# Patient Record
Sex: Male | Born: 1950
Health system: Southern US, Community
[De-identification: ages and names within clinical notes are randomized; demographics above are authoritative.]

## PROBLEM LIST (undated history)

## (undated) DIAGNOSIS — K219 Gastro-esophageal reflux disease without esophagitis: Secondary | ICD-10-CM

## (undated) DIAGNOSIS — E785 Hyperlipidemia, unspecified: Secondary | ICD-10-CM

## (undated) DIAGNOSIS — G4700 Insomnia, unspecified: Secondary | ICD-10-CM

## (undated) DIAGNOSIS — I700 Atherosclerosis of aorta: Secondary | ICD-10-CM

## (undated) DIAGNOSIS — I77811 Abdominal aortic ectasia: Secondary | ICD-10-CM

## (undated) DIAGNOSIS — M109 Gout, unspecified: Secondary | ICD-10-CM

## (undated) DIAGNOSIS — H409 Unspecified glaucoma: Secondary | ICD-10-CM

## (undated) DIAGNOSIS — I10 Essential (primary) hypertension: Secondary | ICD-10-CM

## (undated) HISTORY — DX: Unspecified glaucoma: H40.9

## (undated) HISTORY — DX: Gastro-esophageal reflux disease without esophagitis: K21.9

## (undated) HISTORY — DX: Atherosclerosis of aorta: I70.0

## (undated) HISTORY — DX: Hyperlipidemia, unspecified: E78.5

## (undated) HISTORY — DX: Insomnia, unspecified: G47.00

## (undated) HISTORY — PX: CARPAL TUNNEL RELEASE: SHX101

## (undated) HISTORY — DX: Abdominal aortic ectasia: I77.811

## (undated) HISTORY — DX: Essential (primary) hypertension: I10

## (undated) HISTORY — DX: Gout, unspecified: M10.9

## (undated) HISTORY — PX: TRIGGER FINGER RELEASE: SHX641

## (undated) NOTE — *Deleted (*Deleted)
   [facility]  Name: [name]  MRN: [id number] DOB: 02/07/1951  Chief Complaint/ HPI  Victor Knight,  52 y.o. , male presents for their Follow-Up CCM visit with the clinical pharmacist via telephone due to COVID-19 Pandemic.  PCP : Danelle Berry, PA-C  Their chronic conditions include: ***  Office Visits: 9/22 HLD, [place], BP 124/78 P 65 Wt 195 BMI 28.8  Consult Visit:***  Medications: Outpatient Encounter Medications as of 01/28/2020  Medication Sig  . allopurinol (ZYLOPRIM) 100 MG tablet Take 1 tablet (100 mg total) by mouth daily.  Marland Kitchen lisinopril (ZESTRIL) 10 MG tablet Take 1 tablet (10 mg total) by mouth in the morning.  Marland Kitchen LUMIGAN 0.01 % SOLN Place 1 drop into the right eye at bedtime.   . Multiple Vitamin (MULTIVITAMIN) tablet Take 1 tablet by mouth daily.  Melene Muller ON 02/16/2020] rosuvastatin (CRESTOR) 40 MG tablet Take 1 tablet (40 mg total) by mouth daily.  . traZODone (DESYREL) 100 MG tablet TAKE 1/2 TO 1 TABLET BY MOUTH AT BEDTIME AS NEEDED FOR SLEEP   No facility-administered encounter medications on file as of 01/28/2020.     Current Diagnosis/Assessment:  Goals Addressed   None    Hypertension   BP goal is:  {CHL HP UPSTREAM Pharmacist BP ranges:2522373018}  Office blood pressures are  BP Readings from Last 3 Encounters:  01/07/20 124/78  10/08/19 (!) 151/80  08/22/19 130/80   Patient checks BP at home {CHL HP BP Monitoring Frequency:5515328716} Patient home BP readings are ranging: ***  Patient has failed these meds in the past: *** Patient is currently {CHL Controlled/Uncontrolled:[phone]} on the following medications:  . ***  We discussed: Taper inc lisinopril Taper d/c atenolol  Plan  Continue {CHL HP Upstream Pharmacy [phone]}      Hyperlipidemia   LDL goal < ***  Lipid Panel     Component Value Date/Time   CHOL 138 08/27/2019 0936   TRIG 62 08/27/2019 0936   HDL 62 08/27/2019 0936    LDLCALC 62 08/27/2019 0936    Hepatic Function Latest Ref Rng & Units 08/27/2019 07/02/2019 05/09/2019  Total Protein 6.1 - 8.1 g/dL 6.4 6.5 6.6  Albumin 3.5 - 5.0 g/dL - - -  AST 10 - 35 U/L 28 28 56(H)  ALT 9 - 46 U/L 28 29 78(H)  Alk Phosphatase 38 - 126 U/L - - -  Total Bilirubin 0.2 - 1.2 mg/dL 0.6 0.7 0.4     The 40-JWJX ASCVD risk score Denman George DC Jr., et al., 2013) is: 13.8%   Values used to calculate the score:     Age: 20 years     Sex: Male     Is Non-Hispanic African American: No     Diabetic: No     Tobacco smoker: No     Systolic Blood Pressure: 124 mmHg     Is BP treated: Yes     HDL Cholesterol: 62 mg/dL     Total Cholesterol: 138 mg/dL   Patient has failed these meds in past: *** Patient is currently {CHL Controlled/Uncontrolled:[phone]} on the following medications:  . ***  We discussed:   Holding Zetia?  Plan  Continue {CHL HP Upstream Pharmacy [phone]}  {CHL HP Upstream Pharmacy Diagnosis/[phone]}

---

## 2006-05-01 ENCOUNTER — Ambulatory Visit: Payer: Self-pay | Admitting: Gastroenterology

## 2006-09-24 ENCOUNTER — Ambulatory Visit: Payer: Self-pay | Admitting: Family Medicine

## 2009-09-28 ENCOUNTER — Ambulatory Visit: Payer: Self-pay | Admitting: Family Medicine

## 2009-10-05 ENCOUNTER — Emergency Department: Payer: Self-pay | Admitting: Emergency Medicine

## 2010-03-21 ENCOUNTER — Ambulatory Visit: Payer: Self-pay | Admitting: Specialist

## 2010-03-31 ENCOUNTER — Ambulatory Visit: Payer: Self-pay | Admitting: Specialist

## 2014-02-24 ENCOUNTER — Ambulatory Visit: Payer: Self-pay | Admitting: Anesthesiology

## 2014-03-03 ENCOUNTER — Ambulatory Visit: Payer: Self-pay | Admitting: Specialist

## 2014-04-14 ENCOUNTER — Ambulatory Visit: Payer: Self-pay | Admitting: Specialist

## 2014-07-13 DIAGNOSIS — D239 Other benign neoplasm of skin, unspecified: Secondary | ICD-10-CM

## 2014-07-13 HISTORY — DX: Other benign neoplasm of skin, unspecified: D23.9

## 2014-08-08 NOTE — Op Note (Signed)
PATIENT NAME:  Victor Knight, Victor Knight MR#:  [id number] DATE OF BIRTH:  1950/11/27  DATE OF PROCEDURE:  03/03/2014  PREOPERATIVE DIAGNOSIS: Dupuytren's fascia contracture, right palm and right little finger.   POSTOPERATIVE DIAGNOSIS:  Dupuytren's fascia contracture, right palm and right little finger.   PROCEDURE: Excision of Dupuytren's fascia and contracture, right palm and little finger.   SURGEON: Christophe Louis, M.D.   ANESTHESIA: General.   COMPLICATIONS: None.   TOURNIQUET TIME: 85 minutes.   DRAINS: One vessel loop drain.   DESCRIPTION OF PROCEDURE: After adequate induction of general anesthesia, the right upper extremity is thoroughly prepped with alcohol and ChloraPrep and draped in standard sterile fashion. The extremity is wrapped out with the Esmarch bandage and pneumatic tourniquet is elevated to 250 mmHg.  Under loupe magnification, standard volar Bruner incision is made beginning proximally out to the middle phalanx of the little finger. The skin flaps are developed proximally and the neurovascular bundles are identified. The neurovascular bundles were then completely identified sequentially all the way out to beyond the Dupuytren's fascia overlying the proximal phalanx. After this had been performed, the fascia could be completely excised and there was complete relief of the MP joint flexion contracture.   The wound is thoroughly irrigated multiple times. Ulnar nerve block is performed at the wrist using plain 0.5% Marcaine. The wound is then closed with multiple 5-0 nylon sutures over a vessel loop drain. Soft bulky dressing is applied. The tourniquet is released and capillary refill returns to the finger immediately. The patient is returned to the recovery room in satisfactory condition having tolerated the procedure quite well.    ____________________________ Lucas Mallow, MD ces:kl D: 03/03/2014 13:13:48 ET T: 03/03/2014 14:17:15  ET JOB#: [id number]  cc: Lucas Mallow, MD, <Dictator> Lucas Mallow MD ELECTRONICALLY SIGNED 03/04/2014 15:56

## 2014-08-12 NOTE — Op Note (Signed)
PATIENT NAME:  Victor Knight, Victor Knight MR#:  [id number] DATE OF BIRTH:  11/30/1950  DATE OF PROCEDURE:  04/14/2014  PREOPERATIVE DIAGNOSIS:  Dupuytren's contracture, left palm and left little finger.   POSTOPERATIVE DIAGNOSIS: Dupuytren's contracture, left palm and left little finger.    PROCEDURE: Excision of Dupuytren's fascia, left palm and left little finger.   SURGEON:  Christophe Louis, M.D.   ANESTHESIA: General.   COMPLICATIONS: None.   TOURNIQUET TIME:  Seventy-three minutes.   DESCRIPTION OF PROCEDURE: After adequate induction of general anesthesia, the left upper extremity and hand were thoroughly prepped with alcohol and ChloraPrep and draped in standard sterile fashion.  The extremity is wrapped out with the Esmarch bandage and pneumatic tourniquet elevated to 250 mmHg. Under loupe magnification, standard volar Bruner incision is made and starting proximally, the most proximal portion of the Dupuytren's cord to the little finger is excised. The neurovascular bundles were then identified on each side and dissected out completely to the middle phalanx level of the little finger. After this had been done the contracted fascia was then peeled off of the skin and completely excised. Careful palpation demonstrated no residual diseased fascia. The neurovascular bundles were protected at all times. The wound is thoroughly irrigated multiple times. Skin edges are closed with 4-0 nylon, ulnar nerve block is performed at the wrist with plain 1% Xylocaine. A soft bulky dressing is applied and the tourniquet is released. The patient is returned to the recovery room in satisfactory condition having tolerated the procedure quite well.     ____________________________ Lucas Mallow, MD ces:at D: 04/14/2014 10:41:22 ET T: 04/14/2014 10:48:29 ET JOB#: [id number]  cc: Lucas Mallow, MD, <Dictator> Lucas Mallow MD ELECTRONICALLY SIGNED 04/21/2014 12:07

## 2014-09-29 ENCOUNTER — Other Ambulatory Visit: Payer: Self-pay | Admitting: Family Medicine

## 2014-10-05 ENCOUNTER — Encounter: Payer: Self-pay | Admitting: Family Medicine

## 2014-10-05 ENCOUNTER — Ambulatory Visit (INDEPENDENT_AMBULATORY_CARE_PROVIDER_SITE_OTHER): Payer: BC Managed Care – PPO | Admitting: Family Medicine

## 2014-10-05 VITALS — BP 122/84 | HR 56 | Temp 98.4°F | Resp 16 | Ht 69.0 in | Wt 175.4 lb

## 2014-10-05 DIAGNOSIS — G4700 Insomnia, unspecified: Secondary | ICD-10-CM | POA: Diagnosis not present

## 2014-10-05 DIAGNOSIS — I10 Essential (primary) hypertension: Secondary | ICD-10-CM | POA: Insufficient documentation

## 2014-10-05 DIAGNOSIS — M1000 Idiopathic gout, unspecified site: Secondary | ICD-10-CM | POA: Diagnosis not present

## 2014-10-05 DIAGNOSIS — M109 Gout, unspecified: Secondary | ICD-10-CM | POA: Insufficient documentation

## 2014-10-05 DIAGNOSIS — E785 Hyperlipidemia, unspecified: Secondary | ICD-10-CM | POA: Diagnosis not present

## 2014-10-05 MED ORDER — ZOLPIDEM TARTRATE 10 MG PO TABS: 10.0000 mg | ORAL_TABLET | Freq: Every day | ORAL | Status: DC

## 2014-10-05 NOTE — Patient Instructions (Signed)
F/U 6 MO 

## 2014-10-05 NOTE — Progress Notes (Signed)
Name: Victor Knight   MRN: [id number]    DOB: 1950-08-27   Date:10/05/2014       Progress Note  Subjective  Chief Complaint  Chief Complaint  Patient presents with  . Hypertension  . Insomnia  . Hyperlipidemia    Hypertension This is a chronic problem. The current episode started more than 1 year ago. The problem is unchanged. The problem is controlled. Pertinent negatives include no blurred vision, chest pain, headaches, neck pain, orthopnea, palpitations or shortness of breath. There are no associated agents to hypertension. There are no known risk factors for coronary artery disease. Past treatments include beta blockers. The current treatment provides moderate improvement. There are no compliance problems.   Hyperlipidemia This is a chronic problem. The current episode started more than 1 year ago. The problem is controlled. Recent lipid tests were reviewed and are normal. Factors aggravating his hyperlipidemia include beta blockers. Pertinent negatives include no chest pain, focal weakness, myalgias or shortness of breath. Current antihyperlipidemic treatment includes statins. The current treatment provides moderate improvement of lipids. There are no compliance problems.  Risk factors for coronary artery disease include dyslipidemia and male sex.   Gout  Patient has had a recent flare of gout. He remains on his regimen of allopurinol. No renal complications  Insomnia  Patient continues with problem with insomnia. Ambien continues to work effectively for him.    Past Medical History  Diagnosis Date  . Hyperlipidemia   . Hypertension   . Insomnia   . Gout     History  Substance Use Topics  . Smoking status: Never Smoker   . Smokeless tobacco: Not on file  . Alcohol Use: No     Current outpatient prescriptions:  .  allopurinol (ZYLOPRIM) 100 MG tablet, Take 100 mg by mouth daily., Disp: , Rfl:  .  atenolol (TENORMIN) 100 MG tablet, take 1 tablet by mouth once  daily, Disp: 90 tablet, Rfl: 3 .  atorvastatin (LIPITOR) 40 MG tablet, take 1 tablet by mouth once daily, Disp: 90 tablet, Rfl: 3 .  LUMIGAN 0.01 % SOLN, instill 1 drop into right eye once daily at bedtime, Disp: , Rfl: 0 .  zolpidem (AMBIEN) 10 MG tablet, Take 1 tablet (10 mg total) by mouth at bedtime., Disp: 30 tablet, Rfl: 5  No Known Allergies  Review of Systems  Constitutional: Negative for fever, chills and weight loss.  HENT: Negative for congestion, hearing loss, sore throat and tinnitus.   Eyes: Negative for blurred vision, double vision and redness.  Respiratory: Negative for cough, hemoptysis and shortness of breath.   Cardiovascular: Negative for chest pain, palpitations, orthopnea, claudication and leg swelling.  Gastrointestinal: Negative for heartburn, nausea, vomiting, diarrhea, constipation and blood in stool.  Genitourinary: Negative for dysuria, urgency, frequency and hematuria.  Musculoskeletal: Positive for joint pain. Negative for myalgias, back pain, falls and neck pain.  Skin: Negative for itching.  Neurological: Negative for dizziness, tingling, tremors, focal weakness, seizures, loss of consciousness, weakness and headaches.  Endo/Heme/Allergies: Does not bruise/bleed easily.  Psychiatric/Behavioral: Negative for depression and substance abuse. The patient has insomnia. The patient is not nervous/anxious.      Objective  Filed Vitals:   10/05/14 0909  BP: 122/84  Pulse: 56  Temp: 98.4 F (36.9 C)  TempSrc: Oral  Resp: 16  Height: 5\' 9"  (1.753 m)  Weight: 175 lb 6.4 oz (79.561 kg)  SpO2: 98%     Physical Exam  Constitutional: He is oriented to person,  place, and time and well-developed, well-nourished, and in no distress.  HENT:  Head: Normocephalic.  Eyes: EOM are normal. Pupils are equal, round, and reactive to light.  Neck: Normal range of motion. Neck supple. No thyromegaly present.  Cardiovascular: Normal rate, regular rhythm and normal  heart sounds.   No murmur heard. Pulmonary/Chest: Effort normal and breath sounds normal. No respiratory distress. He has no wheezes.  Abdominal: Soft. Bowel sounds are normal.  Musculoskeletal: Normal range of motion. He exhibits no edema.  Lymphadenopathy:    He has no cervical adenopathy.  Neurological: He is alert and oriented to person, place, and time. No cranial nerve deficit. Gait normal. Coordination normal.  Skin: Skin is warm and dry. No rash noted.  Psychiatric: Affect and judgment normal.  Insomnia      Assessment & Plan  1. Essential hypertension Well-controlled  2. Insomnia Well-controlled on Ambien - zolpidem (AMBIEN) 10 MG tablet; Take 1 tablet (10 mg total) by mouth at bedtime.  Dispense: 30 tablet; Refill: 5  3. Hyperlipemia Last levels were good  4. Idiopathic gout, unspecified chronicity, unspecified site No recent flareup

## 2014-11-09 ENCOUNTER — Other Ambulatory Visit: Payer: Self-pay | Admitting: Family Medicine

## 2015-03-08 ENCOUNTER — Other Ambulatory Visit: Payer: Self-pay | Admitting: Orthopedic Surgery

## 2015-03-08 DIAGNOSIS — M25511 Pain in right shoulder: Secondary | ICD-10-CM

## 2015-03-26 ENCOUNTER — Other Ambulatory Visit: Payer: Self-pay | Admitting: Family Medicine

## 2015-03-26 ENCOUNTER — Encounter: Payer: Self-pay | Admitting: Family Medicine

## 2015-03-26 ENCOUNTER — Ambulatory Visit (INDEPENDENT_AMBULATORY_CARE_PROVIDER_SITE_OTHER): Payer: BC Managed Care – PPO | Admitting: Family Medicine

## 2015-03-26 VITALS — BP 124/82 | HR 63 | Temp 98.7°F | Resp 18 | Ht 69.0 in | Wt 173.8 lb

## 2015-03-26 DIAGNOSIS — M1000 Idiopathic gout, unspecified site: Secondary | ICD-10-CM

## 2015-03-26 DIAGNOSIS — E785 Hyperlipidemia, unspecified: Secondary | ICD-10-CM | POA: Diagnosis not present

## 2015-03-26 DIAGNOSIS — G4700 Insomnia, unspecified: Secondary | ICD-10-CM

## 2015-03-26 DIAGNOSIS — I10 Essential (primary) hypertension: Secondary | ICD-10-CM

## 2015-03-26 DIAGNOSIS — L03115 Cellulitis of right lower limb: Secondary | ICD-10-CM

## 2015-03-26 MED ORDER — ZOLPIDEM TARTRATE 10 MG PO TABS: 10.0000 mg | ORAL_TABLET | Freq: Every day | ORAL | Status: DC

## 2015-03-26 NOTE — Progress Notes (Signed)
Name: Victor Knight   MRN: [id number]    DOB: 1951-01-06   Date:03/26/2015       Progress Note  Subjective  Chief Complaint  Chief Complaint  Patient presents with  . Recurrent Skin Infections    on right knee for 1 week  . Hypertension    6 month follow up  . Hyperlipidemia  . Insomnia  . Gout    HPI  Hypertension   Patient presents for follow-up of hypertension. It has been present for over 5 years.  Patient states that there is compliance with medical regimen which consists of atenolol 100 mg daily . There is no end organ disease. Cardiac risk factors include hypertension hyperlipidemia and diabetes.  Exercise regimen consist of walking .  Diet consist of salt restriction .  Hyperlipidemia  Patient has a history of hyperlipidemia for over 5 years.  Current medical regimen consist of atorvastatin 40 mg daily at bedtime .  Compliance is good .  Diet and exercise are currently followed regularly .  Risk factors for cardiovascular disease include hyperlipidemia hypertension .   There have been no side effects from the medication.    Gout  Gout is not been stable. No recent attacks of gouty arthritis started his he remains on allopurinol 100 mg daily and has been given colchicine and steroids were NSAIDs for flares.  Insomnia  History of insomnia for greater than 5 years. He does well on Ambien 10 mg by mouth daily at bedtime.  Cellulitis of the right knee  Patient noted a Papule on his right knee about 2 weeks ago. It is continued Her to drain intermittently is painful. There is no fever or chills. No history of exposure. There is no history of exposure to sponsor more whirlpools or locker room exposure where others were ill    Past Medical History  Diagnosis Date  . Hyperlipidemia   . Hypertension   . Insomnia   . Gout     Social History  Substance Use Topics  . Smoking status: Never Smoker   . Smokeless tobacco: Not on file  . Alcohol Use: No     Current  outpatient prescriptions:  .  allopurinol (ZYLOPRIM) 100 MG tablet, take 2 tablets by mouth once daily, Disp: 60 tablet, Rfl: 11 .  atenolol (TENORMIN) 100 MG tablet, take 1 tablet by mouth once daily, Disp: 90 tablet, Rfl: 3 .  atorvastatin (LIPITOR) 40 MG tablet, take 1 tablet by mouth once daily, Disp: 90 tablet, Rfl: 3 .  LUMIGAN 0.01 % SOLN, instill 1 drop into right eye once daily at bedtime, Disp: , Rfl: 0 .  naproxen (NAPROSYN) 500 MG tablet, Take 500 mg by mouth 2 (two) times daily with a meal., Disp: , Rfl: 0 .  traMADol (ULTRAM) 50 MG tablet, take 1 tablet by mouth every 6 hours if needed for pain, Disp: , Rfl: 0 .  zolpidem (AMBIEN) 10 MG tablet, Take 1 tablet (10 mg total) by mouth at bedtime., Disp: 30 tablet, Rfl: 5  No Known Allergies  Review of Systems  Constitutional: Negative for fever, chills and weight loss.  HENT: Negative for congestion, hearing loss, sore throat and tinnitus.   Eyes: Negative for blurred vision, double vision and redness.  Respiratory: Negative for cough, hemoptysis and shortness of breath.   Cardiovascular: Negative for chest pain, palpitations, orthopnea, claudication and leg swelling.  Gastrointestinal: Negative for heartburn, nausea, vomiting, diarrhea, constipation and blood in stool.  Genitourinary: Negative for dysuria,  urgency, frequency and hematuria.  Musculoskeletal: Positive for joint pain. Negative for myalgias, back pain, falls and neck pain.  Skin: Positive for rash. Negative for itching.  Neurological: Negative for dizziness, tingling, tremors, focal weakness, seizures, loss of consciousness, weakness and headaches.  Endo/Heme/Allergies: Does not bruise/bleed easily.  Psychiatric/Behavioral: Negative for depression and substance abuse. The patient has insomnia. The patient is not nervous/anxious.      Objective  Filed Vitals:   03/26/15 0813  BP: 124/82  Pulse: 63  Temp: 98.7 F (37.1 C)  TempSrc: Oral  Resp: 18  Height: 5'  9" (1.753 m)  Weight: 173 lb 12.8 oz (78.835 kg)  SpO2: 97%     Physical Exam  Constitutional: He is oriented to person, place, and time and well-developed, well-nourished, and in no distress.  HENT:  Head: Normocephalic.  Eyes: EOM are normal. Pupils are equal, round, and reactive to light.  Neck: Normal range of motion. Neck supple. No thyromegaly present.  Cardiovascular: Normal rate, regular rhythm and normal heart sounds.   No murmur heard. Pulmonary/Chest: Effort normal and breath sounds normal. No respiratory distress. He has no wheezes.  Musculoskeletal: Normal range of motion. He exhibits no edema.  Lymphadenopathy:    He has no cervical adenopathy.  Neurological: He is alert and oriented to person, place, and time. No cranial nerve deficit. Gait normal. Coordination normal.  Skin: No rash noted.  There is a 1 cm draining papule pustular drainage and 826-3 cm area surrounding erythema warmth and mild tenderness over the right knee. No palpable cord is noted. There is no linear streak from the area that would be consistent with lymphangitis  Psychiatric: Affect and judgment normal.      Assessment & Plan   1. Cellulitis of right lower extremity Probably secondary to MRSA - Wound culture  2. Insomnia Continue Ambien - zolpidem (AMBIEN) 10 MG tablet; Take 1 tablet (10 mg total) by mouth at bedtime.  Dispense: 30 tablet; Refill: 5  3. Hyperlipemia Labs on return visit  4. Idiopathic gout, unspecified chronicity, unspecified site Uric acid on return visit Continue allopurinol  5.hypertension discharge temperature culture

## 2015-03-29 ENCOUNTER — Ambulatory Visit
Admission: RE | Admit: 2015-03-29 | Discharge: 2015-03-29 | Disposition: A | Discharge status: Home / Self Care | Payer: BC Managed Care – PPO | Source: Ambulatory Visit | Attending: Orthopedic Surgery | Admitting: Orthopedic Surgery

## 2015-03-29 DIAGNOSIS — M25411 Effusion, right shoulder: Secondary | ICD-10-CM | POA: Diagnosis not present

## 2015-03-29 DIAGNOSIS — M75111 Incomplete rotator cuff tear or rupture of right shoulder, not specified as traumatic: Secondary | ICD-10-CM | POA: Diagnosis not present

## 2015-03-29 DIAGNOSIS — M25511 Pain in right shoulder: Secondary | ICD-10-CM | POA: Insufficient documentation

## 2015-03-29 DIAGNOSIS — M7591 Shoulder lesion, unspecified, right shoulder: Secondary | ICD-10-CM | POA: Insufficient documentation

## 2015-03-29 DIAGNOSIS — M19011 Primary osteoarthritis, right shoulder: Secondary | ICD-10-CM | POA: Insufficient documentation

## 2015-03-30 LAB — WOUND CULTURE

## 2015-03-31 ENCOUNTER — Telehealth: Payer: Self-pay | Admitting: Emergency Medicine

## 2015-03-31 NOTE — Telephone Encounter (Signed)
Patient notified of culture

## 2015-04-08 ENCOUNTER — Ambulatory Visit: Payer: BC Managed Care – PPO | Admitting: Family Medicine

## 2015-04-29 ENCOUNTER — Other Ambulatory Visit: Payer: Self-pay | Admitting: Family Medicine

## 2015-06-08 ENCOUNTER — Ambulatory Visit: Payer: BC Managed Care – PPO | Admitting: Family Medicine

## 2015-07-07 ENCOUNTER — Ambulatory Visit: Payer: BC Managed Care – PPO | Admitting: Family Medicine

## 2015-09-07 DIAGNOSIS — H40051 Ocular hypertension, right eye: Secondary | ICD-10-CM | POA: Diagnosis not present

## 2015-09-22 ENCOUNTER — Other Ambulatory Visit: Payer: Self-pay | Admitting: Family Medicine

## 2015-09-24 ENCOUNTER — Encounter: Payer: BC Managed Care – PPO | Admitting: Family Medicine

## 2015-09-29 DIAGNOSIS — Z1389 Encounter for screening for other disorder: Secondary | ICD-10-CM | POA: Diagnosis not present

## 2015-09-29 DIAGNOSIS — S61248A Puncture wound with foreign body of other finger without damage to nail, initial encounter: Secondary | ICD-10-CM | POA: Diagnosis not present

## 2015-09-29 DIAGNOSIS — K769 Liver disease, unspecified: Secondary | ICD-10-CM | POA: Insufficient documentation

## 2015-09-29 DIAGNOSIS — Z283 Underimmunization status: Secondary | ICD-10-CM | POA: Diagnosis not present

## 2015-10-05 ENCOUNTER — Encounter: Payer: Self-pay | Admitting: Family Medicine

## 2015-10-05 ENCOUNTER — Ambulatory Visit (INDEPENDENT_AMBULATORY_CARE_PROVIDER_SITE_OTHER): Payer: PPO | Admitting: Family Medicine

## 2015-10-05 VITALS — HR 56 | Temp 98.6°F | Resp 14 | Wt 173.0 lb

## 2015-10-05 DIAGNOSIS — Z23 Encounter for immunization: Secondary | ICD-10-CM | POA: Diagnosis not present

## 2015-10-05 DIAGNOSIS — Z720 Tobacco use: Secondary | ICD-10-CM | POA: Diagnosis not present

## 2015-10-05 DIAGNOSIS — E785 Hyperlipidemia, unspecified: Secondary | ICD-10-CM

## 2015-10-05 DIAGNOSIS — Z5181 Encounter for therapeutic drug level monitoring: Secondary | ICD-10-CM | POA: Diagnosis not present

## 2015-10-05 DIAGNOSIS — Z9189 Other specified personal risk factors, not elsewhere classified: Secondary | ICD-10-CM

## 2015-10-05 DIAGNOSIS — M1000 Idiopathic gout, unspecified site: Secondary | ICD-10-CM

## 2015-10-05 DIAGNOSIS — Z0000 Encounter for general adult medical examination without abnormal findings: Secondary | ICD-10-CM

## 2015-10-05 DIAGNOSIS — I10 Essential (primary) hypertension: Secondary | ICD-10-CM

## 2015-10-05 DIAGNOSIS — Z87891 Personal history of nicotine dependence: Secondary | ICD-10-CM | POA: Insufficient documentation

## 2015-10-05 DIAGNOSIS — M109 Gout, unspecified: Secondary | ICD-10-CM | POA: Diagnosis not present

## 2015-10-05 DIAGNOSIS — Z125 Encounter for screening for malignant neoplasm of prostate: Secondary | ICD-10-CM | POA: Diagnosis not present

## 2015-10-05 DIAGNOSIS — Z789 Other specified health status: Secondary | ICD-10-CM | POA: Diagnosis not present

## 2015-10-05 DIAGNOSIS — Z8619 Personal history of other infectious and parasitic diseases: Secondary | ICD-10-CM

## 2015-10-05 LAB — CBC WITH DIFFERENTIAL/PLATELET
BASOS ABS: 71 {cells}/uL (ref 0–200)
Basophils Relative: 1 %
EOS PCT: 1 %
Eosinophils Absolute: 71 cells/uL (ref 15–500)
HCT: 40.1 % (ref 38.5–50.0)
HEMOGLOBIN: 13.6 g/dL (ref 13.2–17.1)
LYMPHS ABS: 2414 {cells}/uL (ref 850–3900)
Lymphocytes Relative: 34 %
MCH: 30.2 pg (ref 27.0–33.0)
MCHC: 33.9 g/dL (ref 32.0–36.0)
MCV: 88.9 fL (ref 80.0–100.0)
MONOS PCT: 7 %
MPV: 9.9 fL (ref 7.5–12.5)
Monocytes Absolute: 497 cells/uL (ref 200–950)
NEUTROS ABS: 4047 {cells}/uL (ref 1500–7800)
Neutrophils Relative %: 57 %
PLATELETS: 211 10*3/uL (ref 140–400)
RBC: 4.51 MIL/uL (ref 4.20–5.80)
RDW: 13.6 % (ref 11.0–15.0)
WBC: 7.1 10*3/uL (ref 3.8–10.8)

## 2015-10-05 LAB — LIPID PANEL
Cholesterol: 181 mg/dL (ref 125–200)
HDL: 64 mg/dL (ref >=40)
LDL CALC: 94 mg/dL (ref <130)
TRIGLYCERIDES: 115 mg/dL (ref <150)
Total CHOL/HDL Ratio: 2.8 Ratio (ref <=5.0)
VLDL: 23 mg/dL (ref <30)

## 2015-10-05 LAB — COMPREHENSIVE METABOLIC PANEL
ALK PHOS: 48 U/L (ref 40–115)
ALT: 30 U/L (ref 9–46)
AST: 26 U/L (ref 10–35)
Albumin: 4.3 g/dL (ref 3.6–5.1)
BUN: 20 mg/dL (ref 7–25)
CO2: 25 mmol/L (ref 20–31)
CREATININE: 0.99 mg/dL (ref 0.70–1.25)
Calcium: 9.1 mg/dL (ref 8.6–10.3)
Chloride: 104 mmol/L (ref 98–110)
GLUCOSE: 87 mg/dL (ref 65–99)
Potassium: 4.4 mmol/L (ref 3.5–5.3)
SODIUM: 139 mmol/L (ref 135–146)
Total Bilirubin: 0.4 mg/dL (ref 0.2–1.2)
Total Protein: 6.7 g/dL (ref 6.1–8.1)

## 2015-10-05 LAB — URIC ACID: Uric Acid, Serum: 5.7 mg/dL (ref 4.0–8.0)

## 2015-10-05 NOTE — Assessment & Plan Note (Signed)
Check uric acid. 

## 2015-10-05 NOTE — Assessment & Plan Note (Signed)
Controlled, DASH guidelines

## 2015-10-05 NOTE — Assessment & Plan Note (Signed)
Check fasting lipids today 

## 2015-10-05 NOTE — Progress Notes (Signed)
Patient ID: Victor Knight, male   DOB: 1950-11-13, 65 y.o.   MRN: [id number]   Subjective:   Victor Knight is a 65 y.o. male here for a complete physical exam  Interim issues since last visit: no major  USPSTF grade A and B recommendations Alcohol:  Few times a week; used to drink more; quit drinking more alcohol about 4 years ago Depression:  Depression screen [facility] 2/9 10/05/2015 03/26/2015  Decreased Interest 0 0  Down, Depressed, Hopeless 0 0  PHQ - 2 Score 0 0   Hypertension: 140/82 Obesity: pretty steady; lost 20 pounds over last few years, steady over the last year; lost weight when he stopped alcohol and changed diet Tobacco use: remote HIV, hep B, hep C: already treated for hep C; declined hiv STD testing and prevention (chl/gon/syphilis): politely declined Lipids: today Glucose: today Colorectal cancer: last one by Dr. Allen Norris, 3 years ago Breast cancer: no lumps; no fam hx Lung cancer: quit more than 15 years ago Osteoporosis: no fam hx, no steroids AAA: no fam hx; smoked 100+ cigs in his lifetime Aspirin: recommended Diet: no fast food; no more than 3 yolks per week; not much cheese Exercise: not regularly, walking briskly Skin cancer: no worrisome moles; sees derm next week  Past Medical History:  Diagnosis Date  . Abdominal aortic atherosclerosis ([facility]) 10/27/2015  . Aortic ectasia, abdominal ([place]) 10/27/2015  . Gout   . Hyperlipidemia   . Hypertension   . Insomnia    Past Surgical History:  Procedure Laterality Date  . CARPAL TUNNEL RELEASE    . TRIGGER FINGER RELEASE     Family History  Problem Relation Age of Onset  . Hypertension Father   . Heart disease Father   . Hypertension Brother    Social History  Substance Use Topics  . Smoking status: Former Research scientist (life sciences)  . Smokeless tobacco: Not on file  . Alcohol use 0.0 oz/week   Review of Systems  Objective:   Vitals:   10/05/15 1423  Pulse: (!) 56  Resp: 14  Temp: 98.6 F (37 C)  TempSrc:  Oral  SpO2: 99%  Weight: 173 lb (78.5 kg)  BP 140/82  Body mass index is 25.55 kg/m. Wt Readings from Last 3 Encounters:  10/05/15 173 lb (78.5 kg)  03/26/15 173 lb 12.8 oz (78.8 kg)  10/05/14 175 lb 6.4 oz (79.6 kg)   Physical Exam  Constitutional: He appears well-developed and well-nourished. No distress.  HENT:  Head: Normocephalic and atraumatic.  Nose: Nose normal.  Mouth/Throat: Oropharynx is clear and moist.  Eyes: EOM are normal. No scleral icterus.  Neck: No JVD present. No thyromegaly present.  Cardiovascular: Normal rate, regular rhythm and normal heart sounds.   Pulmonary/Chest: Effort normal and breath sounds normal. No respiratory distress. He has no wheezes. He has no rales.  Abdominal: Soft. Bowel sounds are normal. He exhibits no distension. There is no tenderness. There is no guarding.  Musculoskeletal: Normal range of motion. He exhibits no edema.  Lymphadenopathy:    He has no cervical adenopathy.  Neurological: He is alert. He displays normal reflexes. He exhibits normal muscle tone. Coordination normal.  Skin: Skin is warm and dry. No rash noted. He is not diaphoretic. No erythema. No pallor.  Psychiatric: He has a normal mood and affect. His behavior is normal. Judgment and thought content normal.    Assessment/Plan:   Problem List Items Addressed This Visit      Cardiovascular and Mediastinum  Hypertension    Controlled, DASH guidelines        Other   Prostate cancer screening   Relevant Orders   PSA (Completed)   Hyperlipemia    Check fasting lipids today      Relevant Orders   Lipid panel (Completed)   Hx of smoking   Relevant Orders   [place] Aorta Initial Medicare Screen (Completed)   History of hepatitis C   Gout    Check uric acid      Relevant Orders   Uric acid (Completed)    Other Visit Diagnoses    Preventative health care    -  Primary   Medication monitoring encounter       Relevant Orders   CBC with  Differential/Platelet (Completed)   Comprehensive metabolic panel (Completed)   Pneumococcal vaccination indicated       Relevant Orders   Pneumococcal conjugate vaccine 13-valent (Completed)      Meds ordered this encounter  Medications  . diclofenac (VOLTAREN) 75 MG EC tablet    Sig: Take 75 mg by mouth 2 (two) times daily.    Refill:  0   Orders Placed This Encounter  Procedures  . [place] Aorta Initial Medicare Screen    Order Specific Question:   Reason for Exam (SYMPTOM  OR DIAGNOSIS REQUIRED)    Answer:   screen for AAA    Order Specific Question:   Preferred imaging location?    Answer:   [facility]  . Pneumococcal conjugate vaccine 13-valent  . PSA    Standing Status:   Future    Number of Occurrences:   1    Standing Expiration Date:   10/12/2015  . CBC with Differential/Platelet    Standing Status:   Future    Number of Occurrences:   1    Standing Expiration Date:   10/12/2015  . Comprehensive metabolic panel    Standing Status:   Future    Number of Occurrences:   1    Standing Expiration Date:   10/12/2015    Order Specific Question:   Has the patient fasted?    Answer:   Yes  . Uric acid    Standing Status:   Future    Number of Occurrences:   1    Standing Expiration Date:   10/12/2015  . Lipid panel    Standing Status:   Future    Number of Occurrences:   1    Standing Expiration Date:   10/12/2015    Order Specific Question:   Has the patient fasted?    Answer:   Yes   Follow up plan: No Follow-up on file. -- one year  An After Visit Summary was printed and given to the patient.

## 2015-10-05 NOTE — Patient Instructions (Addendum)
Please do start a baby coated 81 mg aspirin daily for cardiac protection  We'll have you get an ultrasound of your aorta  Your goal blood pressure is less than 150 mmHg on top. Try to follow the DASH guidelines (DASH stands for Dietary Approaches to Stop Hypertension) Try to limit the sodium in your diet.  Ideally, consume less than 1.5 grams (less than 1,500mg ) per day. Do not add salt when cooking or at the table.  Check the sodium amount on labels when shopping, and choose items lower in sodium when given a choice. Avoid or limit foods that already contain a lot of sodium. Eat a diet rich in fruits and vegetables and whole grains.  You have received the Prevnar vaccine (PCV-13) and you will not need another booster of this for the rest of your life per current ACIP guidelines   DASH Eating Plan DASH stands for "Dietary Approaches to Stop Hypertension." The DASH eating plan is a healthy eating plan that has been shown to reduce high blood pressure (hypertension). Additional health benefits may include reducing the risk of type 2 diabetes mellitus, heart disease, and stroke. The DASH eating plan may also help with weight loss. WHAT DO I NEED TO KNOW ABOUT THE DASH EATING PLAN? For the DASH eating plan, you will follow these general guidelines:  Choose foods with a percent daily value for sodium of less than 5% (as listed on the food label).  Use salt-free seasonings or herbs instead of table salt or sea salt.  Check with your health care provider or pharmacist before using salt substitutes.  Eat lower-sodium products, often labeled as "lower sodium" or "no salt added."  Eat fresh foods.  Eat more vegetables, fruits, and low-fat dairy products.  Choose whole grains. Look for the word "whole" as the first word in the ingredient list.  Choose fish and skinless chicken or [place] more often than red meat. Limit fish, poultry, and meat to 6 oz (170 g) each day.  Limit sweets, desserts,  sugars, and sugary drinks.  Choose heart-healthy fats.  Limit cheese to 1 oz (28 g) per day.  Eat more home-cooked food and less restaurant, buffet, and fast food.  Limit fried foods.  Cook foods using methods other than frying.  Limit canned vegetables. If you do use them, rinse them well to decrease the sodium.  When eating at a restaurant, ask that your food be prepared with less salt, or no salt if possible. WHAT FOODS CAN I EAT? Seek help from a dietitian for individual calorie needs. Grains Whole grain or whole wheat bread. Brown rice. Whole grain or whole wheat pasta. Quinoa, bulgur, and whole grain cereals. Low-sodium cereals. Corn or whole wheat flour tortillas. Whole grain cornbread. Whole grain crackers. Low-sodium crackers. Vegetables Fresh or frozen vegetables (raw, steamed, roasted, or grilled). Low-sodium or reduced-sodium tomato and vegetable juices. Low-sodium or reduced-sodium tomato sauce and paste. Low-sodium or reduced-sodium canned vegetables.  Fruits All fresh, canned (in natural juice), or frozen fruits. Meat and Other Protein Products Ground beef (85% or leaner), grass-fed beef, or beef trimmed of fat. Skinless chicken or [place]. Ground chicken or [place]. Pork trimmed of fat. All fish and seafood. Eggs. Dried beans, peas, or lentils. Unsalted nuts and seeds. Unsalted canned beans. Dairy Low-fat dairy products, such as skim or 1% milk, 2% or reduced-fat cheeses, low-fat ricotta or cottage cheese, or plain low-fat yogurt. Low-sodium or reduced-sodium cheeses. Fats and Oils Tub margarines without trans fats. Light or reduced-fat  mayonnaise and salad dressings (reduced sodium). Avocado. Safflower, olive, or canola oils. Natural peanut or almond butter. Other Unsalted popcorn and pretzels. The items listed above may not be a complete list of recommended foods or beverages. Contact your dietitian for more options. WHAT FOODS ARE NOT RECOMMENDED? Grains White  bread. White pasta. White rice. Refined cornbread. Bagels and croissants. Crackers that contain trans fat. Vegetables Creamed or fried vegetables. Vegetables in a cheese sauce. Regular canned vegetables. Regular canned tomato sauce and paste. Regular tomato and vegetable juices. Fruits Dried fruits. Canned fruit in light or heavy syrup. Fruit juice. Meat and Other Protein Products Fatty cuts of meat. Ribs, chicken wings, bacon, sausage, bologna, salami, chitterlings, fatback, hot dogs, bratwurst, and packaged luncheon meats. Salted nuts and seeds. Canned beans with salt. Dairy Whole or 2% milk, cream, half-and-half, and cream cheese. Whole-fat or sweetened yogurt. Full-fat cheeses or blue cheese. Nondairy creamers and whipped toppings. Processed cheese, cheese spreads, or cheese curds. Condiments Onion and garlic salt, seasoned salt, table salt, and sea salt. Canned and packaged gravies. Worcestershire sauce. Tartar sauce. Barbecue sauce. Teriyaki sauce. Soy sauce, including reduced sodium. Steak sauce. Fish sauce. Oyster sauce. Cocktail sauce. Horseradish. Ketchup and mustard. Meat flavorings and tenderizers. Bouillon cubes. Hot sauce. Tabasco sauce. Marinades. Taco seasonings. Relishes. Fats and Oils Butter, stick margarine, lard, shortening, ghee, and bacon fat. Coconut, palm kernel, or palm oils. Regular salad dressings. Other Pickles and olives. Salted popcorn and pretzels. The items listed above may not be a complete list of foods and beverages to avoid. Contact your dietitian for more information. WHERE CAN I FIND MORE INFORMATION? National Heart, Lung, and Blood Institute: travelstabloid.com   This information is not intended to replace advice given to you by your health care provider. Make sure you discuss any questions you have with your health care provider.   Document Released: 03/23/2011 Document Revised: 04/24/2014 Document Reviewed:  02/05/2013 Elsevier Interactive Patient Education 2016 [place] Maintenance, Male A healthy lifestyle and preventative care can promote health and wellness.  Maintain regular health, dental, and eye exams.  Eat a healthy diet. Foods like vegetables, fruits, whole grains, low-fat dairy products, and lean protein foods contain the nutrients you need and are low in calories. Decrease your intake of foods high in solid fats, added sugars, and salt. Get information about a proper diet from your health care provider, if necessary.  Regular physical exercise is one of the most important things you can do for your health. Most adults should get at least 150 minutes of moderate-intensity exercise (any activity that increases your heart rate and causes you to sweat) each week. In addition, most adults need muscle-strengthening exercises on 2 or more days a week.   Maintain a healthy weight. The body mass index (BMI) is a screening tool to identify possible weight problems. It provides an estimate of body fat based on height and weight. Your health care provider can find your BMI and can help you achieve or maintain a healthy weight. For males 20 years and older:  A BMI below 18.5 is considered underweight.  A BMI of 18.5 to 24.9 is normal.  A BMI of 25 to 29.9 is considered overweight.  A BMI of 30 and above is considered obese.  Maintain normal blood lipids and cholesterol by exercising and minimizing your intake of saturated fat. Eat a balanced diet with plenty of fruits and vegetables. Blood tests for lipids and cholesterol should begin at age 70 and be repeated  every 5 years. If your lipid or cholesterol levels are high, you are over age 43, or you are at high risk for heart disease, you may need your cholesterol levels checked more frequently.Ongoing high lipid and cholesterol levels should be treated with medicines if diet and exercise are not working.  If you smoke, find out from  your health care provider how to quit. If you do not use tobacco, do not start.  Lung cancer screening is recommended for adults aged 71-80 years who are at high risk for developing lung cancer because of a history of smoking. A yearly low-dose CT scan of the lungs is recommended for people who have at least a 30-pack-year history of smoking and are current smokers or have quit within the past 15 years. A pack year of smoking is smoking an average of 1 pack of cigarettes a day for 1 year (for example, a 30-pack-year history of smoking could mean smoking 1 pack a day for 30 years or 2 packs a day for 15 years). Yearly screening should continue until the smoker has stopped smoking for at least 15 years. Yearly screening should be stopped for people who develop a health problem that would prevent them from having lung cancer treatment.  If you choose to drink alcohol, do not have more than 2 drinks per day. One drink is considered to be 12 oz (360 mL) of beer, 5 oz (150 mL) of wine, or 1.5 oz (45 mL) of liquor.  Avoid the use of street drugs. Do not share needles with anyone. Ask for help if you need support or instructions about stopping the use of drugs.  High blood pressure causes heart disease and increases the risk of stroke. High blood pressure is more likely to develop in:  People who have blood pressure in the end of the normal range (100-139/85-89 mm Hg).  People who are overweight or obese.  People who are African American.  If you are 28-42 years of age, have your blood pressure checked every 3-5 years. If you are 43 years of age or older, have your blood pressure checked every year. You should have your blood pressure measured twice--once when you are at a hospital or clinic, and once when you are not at a hospital or clinic. Record the average of the two measurements. To check your blood pressure when you are not at a hospital or clinic, you can use:  An automated blood pressure machine  at a pharmacy.  A home blood pressure monitor.  If you are 41-6 years old, ask your health care provider if you should take aspirin to prevent heart disease.  Diabetes screening involves taking a blood sample to check your fasting blood sugar level. This should be done once every 3 years after age 65 if you are at a normal weight and without risk factors for diabetes. Testing should be considered at a younger age or be carried out more frequently if you are overweight and have at least 1 risk factor for diabetes.  Colorectal cancer can be detected and often prevented. Most routine colorectal cancer screening begins at the age of 84 and continues through age 43. However, your health care provider may recommend screening at an earlier age if you have risk factors for colon cancer. On a yearly basis, your health care provider may provide home test kits to check for hidden blood in the stool. A small camera at the end of a tube may be used to directly  examine the colon (sigmoidoscopy or colonoscopy) to detect the earliest forms of colorectal cancer. Talk to your health care provider about this at age 75 when routine screening begins. A direct exam of the colon should be repeated every 5-10 years through age 29, unless early forms of precancerous polyps or small growths are found.  People who are at an increased risk for hepatitis B should be screened for this virus. You are considered at high risk for hepatitis B if:  You were born in a country where hepatitis B occurs often. Talk with your health care provider about which countries are considered high risk.  Your parents were born in a high-risk country and you have not received a shot to protect against hepatitis B (hepatitis B vaccine).  You have HIV or AIDS.  You use needles to inject street drugs.  You live with, or have sex with, someone who has hepatitis B.  You are a man who has sex with other men (MSM).  You get hemodialysis  treatment.  You take certain medicines for conditions like cancer, organ transplantation, and autoimmune conditions.  Hepatitis C blood testing is recommended for all people born from [birth year] through 1965 and any individual with known risk factors for hepatitis C.  Healthy men should no longer receive prostate-specific antigen (PSA) blood tests as part of routine cancer screening. Talk to your health care provider about prostate cancer screening.  Testicular cancer screening is not recommended for adolescents or adult males who have no symptoms. Screening includes self-exam, a health care provider exam, and other screening tests. Consult with your health care provider about any symptoms you have or any concerns you have about testicular cancer.  Practice safe sex. Use condoms and avoid high-risk sexual practices to reduce the spread of sexually transmitted infections (STIs).  You should be screened for STIs, including gonorrhea and chlamydia if:  You are sexually active and are younger than 24 years.  You are older than 24 years, and your health care provider tells you that you are at risk for this type of infection.  Your sexual activity has changed since you were last screened, and you are at an increased risk for chlamydia or gonorrhea. Ask your health care provider if you are at risk.  If you are at risk of being infected with HIV, it is recommended that you take a prescription medicine daily to prevent HIV infection. This is called pre-exposure prophylaxis (PrEP). You are considered at risk if:  You are a man who has sex with other men (MSM).  You are a heterosexual man who is sexually active with multiple partners.  You take drugs by injection.  You are sexually active with a partner who has HIV.  Talk with your health care provider about whether you are at high risk of being infected with HIV. If you choose to begin PrEP, you should first be tested for HIV. You should then be tested  every 3 months for as long as you are taking PrEP.  Use sunscreen. Apply sunscreen liberally and repeatedly throughout the day. You should seek shade when your shadow is shorter than you. Protect yourself by wearing long sleeves, pants, a wide-brimmed hat, and sunglasses year round whenever you are outdoors.  Tell your health care provider of new moles or changes in moles, especially if there is a change in shape or color. Also, tell your health care provider if a mole is larger than the size of a pencil eraser.  A  one-time screening for abdominal aortic aneurysm (AAA) and surgical repair of large AAAs by ultrasound is recommended for men aged 45-75 years who are current or former smokers.  Stay current with your vaccines (immunizations).   This information is not intended to replace advice given to you by your health care provider. Make sure you discuss any questions you have with your health care provider.   Document Released: 09/30/2007 Document Revised: 04/24/2014 Document Reviewed: 08/29/2010 Elsevier Interactive Patient Education [employer].

## 2015-10-06 ENCOUNTER — Other Ambulatory Visit: Payer: Self-pay | Admitting: Family Medicine

## 2015-10-06 LAB — PSA: PSA: 0.68 ng/mL (ref <=4.00)

## 2015-10-13 ENCOUNTER — Ambulatory Visit: Payer: BC Managed Care – PPO

## 2015-10-26 ENCOUNTER — Ambulatory Visit
Admission: RE | Admit: 2015-10-26 | Discharge: 2015-10-26 | Disposition: A | Discharge status: Home / Self Care | Payer: PPO | Source: Ambulatory Visit | Attending: Family Medicine | Admitting: Family Medicine

## 2015-10-26 DIAGNOSIS — I771 Stricture of artery: Secondary | ICD-10-CM | POA: Insufficient documentation

## 2015-10-26 DIAGNOSIS — Z87891 Personal history of nicotine dependence: Secondary | ICD-10-CM | POA: Insufficient documentation

## 2015-10-26 DIAGNOSIS — I700 Atherosclerosis of aorta: Secondary | ICD-10-CM | POA: Insufficient documentation

## 2015-10-26 DIAGNOSIS — Z136 Encounter for screening for cardiovascular disorders: Secondary | ICD-10-CM | POA: Diagnosis not present

## 2015-10-27 ENCOUNTER — Encounter: Payer: Self-pay | Admitting: Family Medicine

## 2015-10-27 ENCOUNTER — Telehealth: Payer: Self-pay | Admitting: Family Medicine

## 2015-10-27 DIAGNOSIS — I77811 Abdominal aortic ectasia: Secondary | ICD-10-CM | POA: Insufficient documentation

## 2015-10-27 DIAGNOSIS — E785 Hyperlipidemia, unspecified: Secondary | ICD-10-CM

## 2015-10-27 DIAGNOSIS — G4700 Insomnia, unspecified: Secondary | ICD-10-CM

## 2015-10-27 DIAGNOSIS — Z5181 Encounter for therapeutic drug level monitoring: Secondary | ICD-10-CM

## 2015-10-27 DIAGNOSIS — I700 Atherosclerosis of aorta: Secondary | ICD-10-CM | POA: Insufficient documentation

## 2015-10-27 HISTORY — DX: Abdominal aortic ectasia: I77.811

## 2015-10-27 HISTORY — DX: Atherosclerosis of aorta: I70.0

## 2015-10-27 NOTE — Telephone Encounter (Signed)
I left message, calling about [place] results

## 2015-10-29 ENCOUNTER — Other Ambulatory Visit: Payer: Self-pay | Admitting: Family Medicine

## 2015-11-02 DIAGNOSIS — Z5181 Encounter for therapeutic drug level monitoring: Secondary | ICD-10-CM | POA: Insufficient documentation

## 2015-11-02 MED ORDER — ATORVASTATIN CALCIUM 80 MG PO TABS: 80.0000 mg | ORAL_TABLET | Freq: Every day | ORAL | Status: DC

## 2015-11-02 MED ORDER — ZOLPIDEM TARTRATE 5 MG PO TABS: 2.5000 mg | ORAL_TABLET | Freq: Every evening | ORAL | Status: DC | PRN

## 2015-11-02 NOTE — Assessment & Plan Note (Signed)
Check sgpt in 6 weeks 

## 2015-11-02 NOTE — Assessment & Plan Note (Signed)
Goal LDL under 70; increase statin, recheck lipids in 6 weeks (Aug 30th or just after)

## 2015-11-02 NOTE — Telephone Encounter (Signed)
Discussed [place] results; rescan aorta in 5 years Eats mostly chicken and fish; no fast foods; very little fried foods; tries to eat well, fruit, etc. father died at age 65; had a heart attack in late 56s or early 28s; mother lived to be 33; PGM lived to 35 years old; six siblings; he has cut out cheese and milk, drinking almond milk now; really cut back on dairy, little bit of ice cream Increase lipitor to 80 mg at night; recheck liver enzyme and lipid panel in 6-8 weeks He asked for a refill of ambien; he has 10 mg in the list; explained max safe dose is 5 mg now; use less and less; never mix with pain pills, other sleeping pills, alcohol, nerve pills, etc. After hanging up, I checked [facility] and found hydrocodone filled June 14th He has rotator cuff issues, was given Rx from ortho for that; problem with it; I explained dangerous to mix; cannot take it within 6 hours of the sleeping pill; he has NSAID now; may need surgery

## 2015-11-02 NOTE — Telephone Encounter (Signed)
Patient is returning your call pertaining to results.

## 2015-11-03 DIAGNOSIS — D225 Melanocytic nevi of trunk: Secondary | ICD-10-CM | POA: Diagnosis not present

## 2015-11-03 DIAGNOSIS — L820 Inflamed seborrheic keratosis: Secondary | ICD-10-CM | POA: Diagnosis not present

## 2015-11-03 DIAGNOSIS — D485 Neoplasm of uncertain behavior of skin: Secondary | ICD-10-CM | POA: Diagnosis not present

## 2015-11-03 DIAGNOSIS — L812 Freckles: Secondary | ICD-10-CM | POA: Diagnosis not present

## 2015-11-03 DIAGNOSIS — L578 Other skin changes due to chronic exposure to nonionizing radiation: Secondary | ICD-10-CM | POA: Diagnosis not present

## 2015-11-03 DIAGNOSIS — D229 Melanocytic nevi, unspecified: Secondary | ICD-10-CM | POA: Diagnosis not present

## 2015-11-03 DIAGNOSIS — Z1283 Encounter for screening for malignant neoplasm of skin: Secondary | ICD-10-CM | POA: Diagnosis not present

## 2015-11-03 DIAGNOSIS — L821 Other seborrheic keratosis: Secondary | ICD-10-CM | POA: Diagnosis not present

## 2015-11-05 ENCOUNTER — Other Ambulatory Visit: Payer: Self-pay | Admitting: Family Medicine

## 2015-11-11 NOTE — Telephone Encounter (Signed)
LMOM to inform pt °

## 2015-12-05 DIAGNOSIS — Z0000 Encounter for general adult medical examination without abnormal findings: Secondary | ICD-10-CM | POA: Insufficient documentation

## 2015-12-05 NOTE — Assessment & Plan Note (Signed)
USPSTF grade A and B recommendations reviewed with patient; age-appropriate recommendations, preventive care, screening tests, etc discussed and encouraged; healthy living encouraged; see AVS for patient education given to patient  

## 2015-12-14 ENCOUNTER — Other Ambulatory Visit: Payer: Self-pay | Admitting: Family Medicine

## 2015-12-14 NOTE — Telephone Encounter (Signed)
[place] website reviewed Continue to wean

## 2015-12-17 ENCOUNTER — Other Ambulatory Visit: Payer: Self-pay | Admitting: Family Medicine

## 2015-12-27 ENCOUNTER — Other Ambulatory Visit: Payer: Self-pay | Admitting: Family Medicine

## 2015-12-27 NOTE — Telephone Encounter (Signed)
Pt.notified

## 2015-12-27 NOTE — Telephone Encounter (Signed)
I'll send the refill, but please ask patient to go as soon as possible to get his labs recheck (SGPT/ALT and lipids) We had hoped to check those 6 weeks after we made the dose change I hope to see good numbers

## 2015-12-30 ENCOUNTER — Other Ambulatory Visit: Payer: Self-pay

## 2015-12-30 DIAGNOSIS — Z5181 Encounter for therapeutic drug level monitoring: Secondary | ICD-10-CM | POA: Diagnosis not present

## 2015-12-30 DIAGNOSIS — I700 Atherosclerosis of aorta: Secondary | ICD-10-CM | POA: Diagnosis not present

## 2015-12-30 DIAGNOSIS — I77811 Abdominal aortic ectasia: Secondary | ICD-10-CM

## 2015-12-30 DIAGNOSIS — E785 Hyperlipidemia, unspecified: Secondary | ICD-10-CM

## 2015-12-30 LAB — LIPID PANEL
CHOL/HDL RATIO: 3.2 ratio (ref <=5.0)
Cholesterol: 188 mg/dL (ref 125–200)
HDL: 59 mg/dL (ref >=40)
LDL CALC: 104 mg/dL (ref <130)
Triglycerides: 126 mg/dL (ref <150)
VLDL: 25 mg/dL (ref <30)

## 2015-12-30 LAB — ALT: ALT: 29 U/L (ref 9–46)

## 2015-12-30 NOTE — Telephone Encounter (Signed)
Our apologies; it looks like this went to Dr. Rutherford Nail; I'll be glad to send the allopurinol; creatinine and uric acid reviewed; Rx approved

## 2015-12-30 NOTE — Telephone Encounter (Signed)
refill 

## 2016-01-03 ENCOUNTER — Other Ambulatory Visit: Payer: Self-pay

## 2016-01-18 ENCOUNTER — Telehealth: Payer: Self-pay | Admitting: Family Medicine

## 2016-01-18 NOTE — Telephone Encounter (Signed)
If he plans on staying on the Ambien, he'll need an appt, urine drug screen, and controlled substance contract please I was hoping he would come off of this medicine; patients who need controlled substances need to be seen every 3 months; last visit was in June; sorry

## 2016-01-20 NOTE — Telephone Encounter (Signed)
Patient has appointment for 02/04/16

## 2016-01-31 ENCOUNTER — Encounter: Payer: Self-pay | Admitting: Family Medicine

## 2016-01-31 ENCOUNTER — Ambulatory Visit (INDEPENDENT_AMBULATORY_CARE_PROVIDER_SITE_OTHER): Payer: PPO | Admitting: Family Medicine

## 2016-01-31 ENCOUNTER — Telehealth: Payer: Self-pay | Admitting: Family Medicine

## 2016-01-31 DIAGNOSIS — E782 Mixed hyperlipidemia: Secondary | ICD-10-CM | POA: Diagnosis not present

## 2016-01-31 DIAGNOSIS — F5101 Primary insomnia: Secondary | ICD-10-CM

## 2016-01-31 MED ORDER — TRAZODONE HCL 50 MG PO TABS: 50.0000 mg | ORAL_TABLET | Freq: Every evening | ORAL | 0 refills | Status: DC | PRN

## 2016-01-31 NOTE — Telephone Encounter (Signed)
ERRENOUS °

## 2016-01-31 NOTE — Progress Notes (Signed)
BP 122/62   Pulse (!) 59   Temp 98.4 F (36.9 C) (Oral)   Resp 14   Wt 174 lb 12.8 oz (79.3 kg)   SpO2 98%   BMI 25.81 kg/m    Subjective:    Patient ID: Victor Knight, male    DOB: Jan 25, 1951, 65 y.o.   MRN: [id number]  HPI: Victor Knight is a 65 y.o. male  Chief Complaint  Patient presents with  . Follow-up   Insomnia He has been on [place] for 12-13 years He went through hep C treatment, went through interferon treatment and needed it then He has managed to cut it in half, but he thinks he needs more time to taper off On the 10 mg, he'd get 6 hours of good sleep; now he's waking up at 3:30 and 4:30 am and might get up then He used to need more sleep, doesn't need as much sleep as before Quality of sleep isn't as good He does read before going to bed; not watching TV He is aware of the dangers of ambien; we reviewed that at previous visit He has not tried other things  High cholesterol; he cut back on red meat and pork; not much pork, maybe red meat once a week; does eat cheese, but has cut back; more stews and soups in the winter; does eat more eggs; not more butter, uses coconut; more iced cream On atorvastatin 80 mg  Depression screen [facility] 2/9 01/31/2016 10/05/2015 03/26/2015  Decreased Interest 0 0 0  Down, Depressed, Hopeless 0 0 0  PHQ - 2 Score 0 0 0    Relevant past medical, surgical, family and social history reviewed Past Medical History:  Diagnosis Date  . Abdominal aortic atherosclerosis ([place]) 10/27/2015  . Aortic ectasia, abdominal ([place]) 10/27/2015  . Gout   . Hyperlipidemia   . Hypertension   . Insomnia    Past Surgical History:  Procedure Laterality Date  . CARPAL TUNNEL RELEASE    . TRIGGER FINGER RELEASE     Family History  Problem Relation Age of Onset  . Hypertension Father   . Heart disease Father   . Hypertension Brother    Social History  Substance Use Topics  . Smoking status: Former Research scientist (life sciences)  . Smokeless tobacco: Never Used    . Alcohol use 0.0 oz/week    Interim medical history since last visit reviewed. Allergies and medications reviewed  Review of Systems Per HPI unless specifically indicated above     Objective:    BP 122/62   Pulse (!) 59   Temp 98.4 F (36.9 C) (Oral)   Resp 14   Wt 174 lb 12.8 oz (79.3 kg)   SpO2 98%   BMI 25.81 kg/m   Wt Readings from Last 3 Encounters:  01/31/16 174 lb 12.8 oz (79.3 kg)  10/05/15 173 lb (78.5 kg)  03/26/15 173 lb 12.8 oz (78.8 kg)    Physical Exam  Constitutional: He appears well-developed and well-nourished. No distress.  HENT:  Head: Normocephalic and atraumatic.  Nose: Nose normal.  Mouth/Throat: Oropharynx is clear and moist.  Eyes: EOM are normal. No scleral icterus.  Neck: No JVD present. No thyromegaly present.  Cardiovascular: Normal rate, regular rhythm and normal heart sounds.   Pulmonary/Chest: Effort normal and breath sounds normal. No respiratory distress. He has no wheezes. He has no rales.  Abdominal: Soft. Bowel sounds are normal. He exhibits no distension. There is no tenderness. There is no guarding.  Musculoskeletal: Normal range of motion. He exhibits no edema.  Lymphadenopathy:    He has no cervical adenopathy.  Neurological: He is alert. He displays normal reflexes. He exhibits normal muscle tone. Coordination normal.  Skin: Skin is warm and dry. No rash noted. He is not diaphoretic. No erythema. No pallor.  Psychiatric: He has a normal mood and affect. His behavior is normal. Judgment and thought content normal.    Results for orders placed or performed in visit on 12/30/15  ALT  Result Value Ref Range   ALT 29 9 - 46 U/L  Lipid panel  Result Value Ref Range   Cholesterol 188 125 - 200 mg/dL   Triglycerides 126 <150 mg/dL   HDL 59 >=40 mg/dL   Total CHOL/HDL Ratio 3.2 <=5.0 Ratio   VLDL 25 <30 mg/dL   LDL Cholesterol 104 <130 mg/dL      Assessment & Plan:   Problem List Items Addressed This Visit      Other    Insomnia (Chronic)    Discussed cautions of ambien, other sedative hypnotics; reviewed publication from Feb 0000000 in [place] regarding increased risk of death with use; urged patient to stop / wean off; I will not plan on refilling this medicine at all after 3 months from now; discussed risk in individuals 28+ years of age as well; other sleep hygiene factors discussed; melatonin may help with resetting time, but won't cause sedative effects; caffeine, electronics, etc. all discussed; patient agrees to try trazodone in place of [place], which is not associated with the risks noted in Feb 0000000 publication, and I would be willing to prescribe trazodone long-term; no hx of heart attack; patient reluctantly agrees with plan      Hyperlipemia (Chronic)    Glad that patient has made some lifestyle changes, healthier eating should help with lowering LDL       Other Visit Diagnoses   None.     Follow up plan: Return in about 3 months (around 05/02/2016).  An after-visit summary was printed and given to the patient at [facility].  Please see the patient instructions which may contain other information and recommendations beyond what is mentioned above in the assessment and plan.  Meds ordered this encounter  Medications  . traZODone (DESYREL) 50 MG tablet    Sig: Take 1-2 tablets (50-100 mg total) by mouth at bedtime as needed for sleep.    Dispense:  60 tablet    Refill:  0    No orders of the defined types were placed in this encounter.

## 2016-01-31 NOTE — Patient Instructions (Signed)
Try to limit saturated fats in your diet (bologna, hot dogs, barbeque, cheeseburgers, hamburgers, steak, bacon, sausage, cheese, etc.) and get more fresh fruits, vegetables, and whole grains  Try to get 38+ grams of fiber a day  Recheck labs in 3 months, come fasting Stop the [employer] trazodone and use prior to bed Call me with an update in 3-4 weeks

## 2016-02-03 DIAGNOSIS — D229 Melanocytic nevi, unspecified: Secondary | ICD-10-CM | POA: Diagnosis not present

## 2016-02-03 DIAGNOSIS — D1800 Hemangioma unspecified site: Secondary | ICD-10-CM | POA: Diagnosis not present

## 2016-02-03 DIAGNOSIS — L578 Other skin changes due to chronic exposure to nonionizing radiation: Secondary | ICD-10-CM | POA: Diagnosis not present

## 2016-02-03 DIAGNOSIS — L821 Other seborrheic keratosis: Secondary | ICD-10-CM | POA: Diagnosis not present

## 2016-02-03 DIAGNOSIS — D485 Neoplasm of uncertain behavior of skin: Secondary | ICD-10-CM | POA: Diagnosis not present

## 2016-02-04 ENCOUNTER — Ambulatory Visit: Payer: PPO | Admitting: Family Medicine

## 2016-02-04 ENCOUNTER — Other Ambulatory Visit: Payer: Self-pay | Admitting: Family Medicine

## 2016-02-04 NOTE — Assessment & Plan Note (Addendum)
Discussed cautions of Lorrin Mais, other sedative hypnotics; reviewed publication from Feb 0000000 in [place] regarding increased risk of death with use; urged patient to stop / wean off; I will not plan on refilling this medicine at all after 3 months from now; discussed risk in individuals 24+ years of age as well; other sleep hygiene factors discussed; melatonin may help with resetting time, but won't cause sedative effects; caffeine, electronics, etc. all discussed; patient agrees to try trazodone in place of [place], which is not associated with the risks noted in Feb 0000000 publication, and I would be willing to prescribe trazodone long-term; no hx of heart attack; patient reluctantly agrees with plan

## 2016-02-04 NOTE — Assessment & Plan Note (Addendum)
Glad that patient has made some lifestyle changes, healthier eating should help with lowering LDL

## 2016-02-11 ENCOUNTER — Ambulatory Visit: Payer: PPO | Admitting: Family Medicine

## 2016-03-03 ENCOUNTER — Other Ambulatory Visit: Payer: Self-pay | Admitting: Family Medicine

## 2016-03-03 NOTE — Telephone Encounter (Signed)
rx approved

## 2016-03-07 DIAGNOSIS — H40051 Ocular hypertension, right eye: Secondary | ICD-10-CM | POA: Diagnosis not present

## 2016-03-08 DIAGNOSIS — M25511 Pain in right shoulder: Secondary | ICD-10-CM | POA: Diagnosis not present

## 2016-03-08 DIAGNOSIS — G8929 Other chronic pain: Secondary | ICD-10-CM | POA: Diagnosis not present

## 2016-03-08 DIAGNOSIS — M75121 Complete rotator cuff tear or rupture of right shoulder, not specified as traumatic: Secondary | ICD-10-CM | POA: Diagnosis not present

## 2016-04-20 DIAGNOSIS — M75121 Complete rotator cuff tear or rupture of right shoulder, not specified as traumatic: Secondary | ICD-10-CM | POA: Insufficient documentation

## 2016-04-20 DIAGNOSIS — M7541 Impingement syndrome of right shoulder: Secondary | ICD-10-CM | POA: Diagnosis not present

## 2016-04-20 DIAGNOSIS — Z79899 Other long term (current) drug therapy: Secondary | ICD-10-CM | POA: Diagnosis not present

## 2016-04-20 DIAGNOSIS — M25511 Pain in right shoulder: Secondary | ICD-10-CM | POA: Diagnosis not present

## 2016-04-20 DIAGNOSIS — M24111 Other articular cartilage disorders, right shoulder: Secondary | ICD-10-CM | POA: Diagnosis not present

## 2016-04-20 DIAGNOSIS — Z87891 Personal history of nicotine dependence: Secondary | ICD-10-CM | POA: Diagnosis not present

## 2016-04-20 DIAGNOSIS — Z881 Allergy status to other antibiotic agents status: Secondary | ICD-10-CM | POA: Diagnosis not present

## 2016-04-20 DIAGNOSIS — I10 Essential (primary) hypertension: Secondary | ICD-10-CM | POA: Diagnosis not present

## 2016-04-20 DIAGNOSIS — M7551 Bursitis of right shoulder: Secondary | ICD-10-CM | POA: Diagnosis not present

## 2016-04-20 DIAGNOSIS — Z8619 Personal history of other infectious and parasitic diseases: Secondary | ICD-10-CM | POA: Diagnosis not present

## 2016-04-20 DIAGNOSIS — M47812 Spondylosis without myelopathy or radiculopathy, cervical region: Secondary | ICD-10-CM | POA: Diagnosis not present

## 2016-04-20 HISTORY — DX: Complete rotator cuff tear or rupture of right shoulder, not specified as traumatic: M75.121

## 2016-04-20 HISTORY — PX: OTHER SURGICAL HISTORY: SHX169

## 2016-04-24 DIAGNOSIS — Z4789 Encounter for other orthopedic aftercare: Secondary | ICD-10-CM | POA: Diagnosis not present

## 2016-05-02 ENCOUNTER — Ambulatory Visit: Payer: PPO | Admitting: Family Medicine

## 2016-05-12 ENCOUNTER — Encounter: Payer: Self-pay | Admitting: Family Medicine

## 2016-05-12 ENCOUNTER — Ambulatory Visit (INDEPENDENT_AMBULATORY_CARE_PROVIDER_SITE_OTHER): Payer: PPO | Admitting: Family Medicine

## 2016-05-12 DIAGNOSIS — E782 Mixed hyperlipidemia: Secondary | ICD-10-CM | POA: Diagnosis not present

## 2016-05-12 DIAGNOSIS — I10 Essential (primary) hypertension: Secondary | ICD-10-CM

## 2016-05-12 DIAGNOSIS — I700 Atherosclerosis of aorta: Secondary | ICD-10-CM | POA: Diagnosis not present

## 2016-05-12 DIAGNOSIS — F5101 Primary insomnia: Secondary | ICD-10-CM | POA: Diagnosis not present

## 2016-05-12 NOTE — Progress Notes (Signed)
BP 134/82   Pulse 77   Temp 98.1 F (36.7 C) (Oral)   Resp 14   Wt 174 lb 2 oz (79 kg)   SpO2 96%   BMI 25.71 kg/m    Subjective:    Patient ID: Victor Knight, male    DOB: June 03, 1950, 66 y.o.   MRN: [id number]  HPI: Victor Knight is a 66 y.o. male  Chief Complaint  Patient presents with  . Follow-up    3 Month    Patient is here for follow-up; since last visit, he had shoulder surgery, wearing sling today Goes back on Wednesday and hopes to get turned loose to start driving Doing exercises Pain control is managed  Blood pressure controlled today  He is on trazodone; it is not really working; it relaxes him he says and helps him get to sleep but doesn't stay asleep Not a urinary frequency or nocturia Not sputtering and catching his breath when he wakes up Snoring a little but not sleep apnea I asked about mood; he wouldn't call it "depression" but in the last few months he has had some lows He started taking sleepy time tea; he can tell when he doesn't take it; not watching tv at night; reads a book Wakes up after 3-4 hours; some nights he can go back to sleep; a lot of times, the brain kicks on and he gets up He was on [place] before and that would keep him asleep Tried taking two trazodone at once, but that caused him to feel groggy; he tried 50 mg and then another 50 mg and that worked fairly well; was still waking up with 100 mg strength He is sleeping in a recliner right now with shoulder surgery  We talked about his LDL; he is not able to really exercises; not much red meat and pork; no fast food; does eat cheese  Depression screen [facility] 2/9 05/12/2016 01/31/2016 10/05/2015 03/26/2015  Decreased Interest 0 0 0 0  Down, Depressed, Hopeless 0 0 0 0  PHQ - 2 Score 0 0 0 0   Relevant past medical, surgical, family and social history reviewed Past Medical History:  Diagnosis Date  . Abdominal aortic atherosclerosis ([place]) 10/27/2015  . Aortic ectasia, abdominal  ([place]) 10/27/2015  . Gout   . Hyperlipidemia   . Hypertension   . Insomnia    Past Surgical History:  Procedure Laterality Date  . CARPAL TUNNEL RELEASE    . TRIGGER FINGER RELEASE    MD note: patient is s/p right rotator cuff repair 04/20/2016  Family History  Problem Relation Age of Onset  . Hypertension Father   . Heart disease Father   . Hypertension Brother   . Hypertension Sister   . Supraventricular tachycardia Daughter   . Heart disease Daughter   . Hypertension Brother   . Hypertension Brother   . Hypertension Brother   . Heart disease Brother    Social History  Substance Use Topics  . Smoking status: Former Research scientist (life sciences)  . Smokeless tobacco: Never Used  . Alcohol use 0.0 oz/week   Interim medical history since last visit reviewed. Allergies and medications reviewed  Review of Systems Per HPI unless specifically indicated above     Objective:    BP 134/82   Pulse 77   Temp 98.1 F (36.7 C) (Oral)   Resp 14   Wt 174 lb 2 oz (79 kg)   SpO2 96%   BMI 25.71 kg/m   Wt Readings  from Last 3 Encounters:  05/12/16 174 lb 2 oz (79 kg)  01/31/16 174 lb 12.8 oz (79.3 kg)  10/05/15 173 lb (78.5 kg)    Physical Exam  Constitutional: He appears well-developed and well-nourished. No distress.  HENT:  Head: Normocephalic and atraumatic.  Nose: Nose normal.  Mouth/Throat: Oropharynx is clear and moist.  Eyes: EOM are normal. No scleral icterus.  Neck: No JVD present. No thyromegaly present.  Cardiovascular: Normal rate, regular rhythm and normal heart sounds.   Pulmonary/Chest: Effort normal and breath sounds normal. No respiratory distress. He has no wheezes. He has no rales.  Abdominal: Soft. Bowel sounds are normal. He exhibits no distension. There is no tenderness. There is no guarding.  Musculoskeletal: Normal range of motion. He exhibits no edema.  Wearing sling on right arm/shoulder  Lymphadenopathy:    He has no cervical adenopathy.  Neurological: He is  alert. He displays normal reflexes. He exhibits normal muscle tone. Coordination normal.  Skin: Skin is warm and dry. No rash noted. He is not diaphoretic. No erythema. No pallor.  Psychiatric: He has a normal mood and affect. His behavior is normal. Judgment and thought content normal. His mood appears not anxious. He does not exhibit a depressed mood.      Assessment & Plan:   Problem List Items Addressed This Visit      Cardiovascular and Mediastinum   Hypertension (Chronic)    Controlled today      Abdominal aortic atherosclerosis (HCC) (Chronic)    Important to control lipids and BP; encouraged patient to consider PCSK-9 inhibitor, goal LDL less than 70; he'll try dietary changes, continue statin, return for recheck fasting lipids in 3 months        Other   Insomnia (Chronic)    Chronic insomnia; refer for sleep evaluation; we have discussed dangers of ambien; encouraged him to work with cognitive behavioral therapist for sleep as well, list of providers given ([facility] site)      Relevant Orders   Ambulatory referral to Neurology   Hyperlipemia (Chronic)    Reviewed previous labs, diagnoses including abdominal aortic athero; goal LDL less than 70; encouraged him to consider PCSK-9 inhibitor; he'll try dietary changes and return in 3 months for recheck of fasting labs         Follow up plan: Return in about 3 months (around 08/10/2016) for fasting labs and visit.  An after-visit summary was printed and given to the patient at [facility].  Please see the patient instructions which may contain other information and recommendations beyond what is mentioned above in the assessment and plan.   Orders Placed This Encounter  Procedures  . Ambulatory referral to Neurology  Face-to-face time with patient was more than 25 minutes, >50% time spent counseling and coordination of care

## 2016-05-12 NOTE — Assessment & Plan Note (Addendum)
Chronic insomnia; refer for sleep evaluation; we have discussed dangers of ambien; encouraged him to work with cognitive behavioral therapist for sleep as well, list of providers given ([facility] site)

## 2016-05-12 NOTE — Patient Instructions (Signed)
We'll have you see the sleep specialist Try working with a cognitive behavioral therapist for insomnia Try to limit saturated fats in your diet (bologna, hot dogs, barbeque, cheeseburgers, hamburgers, steak, bacon, sausage, cheese, etc.) and get more fresh fruits, vegetables, and whole grains Return in 3 months for fasting labs Consider or read about Repatha or Praluent

## 2016-05-15 ENCOUNTER — Telehealth: Payer: Self-pay | Admitting: Family Medicine

## 2016-05-15 NOTE — Telephone Encounter (Signed)
Called patient to better understand the referral. .looked in his chart and saw that he had a referral for neurologist on 05/13/2015 to the NP Dr. Rufina Falco. Left a voicemail asking the patient to call out office back regarding the referral.

## 2016-05-15 NOTE — Telephone Encounter (Signed)
Pt needs a call back about his Sleep Study referral.

## 2016-05-16 NOTE — Telephone Encounter (Signed)
Patient returning Amber's call.  Please call back.

## 2016-05-16 NOTE — Telephone Encounter (Signed)
Patient asked for an earlier appointment due the lack of sleep he is experiencing.    I was able to get the pt appointment with NP Rufina Falco @ [facility] neuro Feb 15 @ 10:00am.

## 2016-05-21 NOTE — Assessment & Plan Note (Signed)
Controlled today 

## 2016-05-21 NOTE — Assessment & Plan Note (Signed)
Important to control lipids and BP; encouraged patient to consider PCSK-9 inhibitor, goal LDL less than 70; he'll try dietary changes, continue statin, return for recheck fasting lipids in 3 months

## 2016-05-21 NOTE — Assessment & Plan Note (Signed)
Reviewed previous labs, diagnoses including abdominal aortic athero; goal LDL less than 70; encouraged him to consider PCSK-9 inhibitor; he'll try dietary changes and return in 3 months for recheck of fasting labs

## 2016-05-23 DIAGNOSIS — Z4789 Encounter for other orthopedic aftercare: Secondary | ICD-10-CM | POA: Diagnosis not present

## 2016-05-29 ENCOUNTER — Other Ambulatory Visit: Payer: Self-pay | Admitting: Family Medicine

## 2016-06-01 ENCOUNTER — Other Ambulatory Visit: Payer: Self-pay | Admitting: Family Medicine

## 2016-06-01 DIAGNOSIS — F5104 Psychophysiologic insomnia: Secondary | ICD-10-CM | POA: Diagnosis not present

## 2016-06-01 DIAGNOSIS — R0683 Snoring: Secondary | ICD-10-CM | POA: Diagnosis not present

## 2016-06-01 NOTE — Telephone Encounter (Signed)
Last BP and pulse reviewed; Rx approved 

## 2016-06-20 DIAGNOSIS — Z4789 Encounter for other orthopedic aftercare: Secondary | ICD-10-CM | POA: Diagnosis not present

## 2016-07-05 ENCOUNTER — Ambulatory Visit: Payer: PPO | Attending: Neurology

## 2016-07-05 DIAGNOSIS — Z7982 Long term (current) use of aspirin: Secondary | ICD-10-CM | POA: Diagnosis not present

## 2016-07-05 DIAGNOSIS — I10 Essential (primary) hypertension: Secondary | ICD-10-CM | POA: Insufficient documentation

## 2016-07-05 DIAGNOSIS — Z79899 Other long term (current) drug therapy: Secondary | ICD-10-CM | POA: Diagnosis not present

## 2016-07-05 DIAGNOSIS — R0683 Snoring: Secondary | ICD-10-CM | POA: Diagnosis not present

## 2016-07-11 DIAGNOSIS — R0683 Snoring: Secondary | ICD-10-CM | POA: Diagnosis not present

## 2016-07-17 DIAGNOSIS — Z4789 Encounter for other orthopedic aftercare: Secondary | ICD-10-CM | POA: Diagnosis not present

## 2016-07-25 ENCOUNTER — Encounter: Payer: Self-pay | Admitting: Family Medicine

## 2016-07-25 ENCOUNTER — Ambulatory Visit (INDEPENDENT_AMBULATORY_CARE_PROVIDER_SITE_OTHER): Payer: PPO | Admitting: Family Medicine

## 2016-07-25 VITALS — BP 120/70 | HR 72 | Temp 98.5°F | Wt 179.2 lb

## 2016-07-25 DIAGNOSIS — Z5181 Encounter for therapeutic drug level monitoring: Secondary | ICD-10-CM

## 2016-07-25 DIAGNOSIS — J0100 Acute maxillary sinusitis, unspecified: Secondary | ICD-10-CM | POA: Diagnosis not present

## 2016-07-25 DIAGNOSIS — E785 Hyperlipidemia, unspecified: Secondary | ICD-10-CM | POA: Diagnosis not present

## 2016-07-25 DIAGNOSIS — M1000 Idiopathic gout, unspecified site: Secondary | ICD-10-CM | POA: Diagnosis not present

## 2016-07-25 MED ORDER — AMOXICILLIN-POT CLAVULANATE 875-125 MG PO TABS: 1.0000 | ORAL_TABLET | Freq: Two times a day (BID) | ORAL | 0 refills | Status: AC

## 2016-07-25 NOTE — Progress Notes (Signed)
BP 120/70   Pulse 72   Temp 98.5 F (36.9 C) (Oral)   Wt 179 lb 3.2 oz (81.3 kg)   SpO2 97%   BMI 26.46 kg/m    Subjective:    Patient ID: Victor Knight, male    DOB: [birth year], 66 y.o.   MRN: [id number]  HPI: Victor Knight is a 66 y.o. male  Chief Complaint  Patient presents with  . Sinusitis    coughing, congestion     He has been sick for 7-10 days Started with congestion; using saline rinses; getting out green stuff Starts with allergies and progresses Tries claritin Recurrent issue, not too often, none in several years He has had sinus infections in the past and believes this is a sinus infection; wouldn't be here otherwise Cheeks are affected Ears okay No fevers No weird rash No travel Wife has also been sick  He has an appointment soon and would like to come in prior to the visit for his labs He has high cholesterol and is on a statin; he has gout and is on allopurinol  Depression screen [facility] 2/9 07/25/2016 05/12/2016 01/31/2016 10/05/2015 03/26/2015  Decreased Interest 0 0 0 0 0  Down, Depressed, Hopeless 0 0 0 0 0  PHQ - 2 Score 0 0 0 0 0   Relevant past medical, surgical, family and social history reviewed Past Medical History:  Diagnosis Date  . Abdominal aortic atherosclerosis ([place]) 10/27/2015  . Aortic ectasia, abdominal ([place]) 10/27/2015  . Gout   . Hyperlipidemia   . Hypertension   . Insomnia    Family History  Problem Relation Age of Onset  . Hypertension Father   . Heart disease Father   . Hypertension Brother   . Hypertension Sister   . Supraventricular tachycardia Daughter   . Heart disease Daughter   . Hypertension Brother   . Hypertension Brother   . Hypertension Brother   . Heart disease Brother    Social History  Substance Use Topics  . Smoking status: Former Research scientist (life sciences)  . Smokeless tobacco: Never Used  . Alcohol use 0.0 oz/week   Interim medical history since last visit reviewed. Allergies and medications  reviewed  Review of Systems Per HPI unless specifically indicated above     Objective:    BP 120/70   Pulse 72   Temp 98.5 F (36.9 C) (Oral)   Wt 179 lb 3.2 oz (81.3 kg)   SpO2 97%   BMI 26.46 kg/m   Wt Readings from Last 3 Encounters:  07/25/16 179 lb 3.2 oz (81.3 kg)  05/12/16 174 lb 2 oz (79 kg)  01/31/16 174 lb 12.8 oz (79.3 kg)    Physical Exam  Constitutional: He appears well-developed and well-nourished. No distress.  HENT:  Right Ear: External ear normal.  Left Ear: External ear normal.  Nose: Rhinorrhea present. No mucosal edema. Right sinus exhibits no maxillary sinus tenderness and no frontal sinus tenderness. Left sinus exhibits no maxillary sinus tenderness and no frontal sinus tenderness.  Mouth/Throat: Oropharynx is clear and moist and mucous membranes are normal. No oropharyngeal exudate.  Eyes: Right eye exhibits no discharge. Left eye exhibits no discharge.  Cardiovascular: Normal rate and regular rhythm.   Pulmonary/Chest: Effort normal and breath sounds normal.  Lymphadenopathy:    He has no cervical adenopathy.    Results for orders placed or performed in visit on 12/30/15  ALT  Result Value Ref Range   ALT 29 9 - 46 U/L  Lipid panel  Result Value Ref Range   Cholesterol 188 125 - 200 mg/dL   Triglycerides 126 <150 mg/dL   HDL 59 >=40 mg/dL   Total CHOL/HDL Ratio 3.2 <=5.0 Ratio   VLDL 25 <30 mg/dL   LDL Cholesterol 104 <130 mg/dL      Assessment & Plan:   Problem List Items Addressed This Visit      Other   Medication monitoring encounter    Monitor Cr on the allopurinol; monitor SGPT on the statin      Relevant Orders   COMPLETE METABOLIC PANEL WITH GFR   Hyperlipidemia LDL goal <70    Check lipids prior to next appt      Relevant Orders   Lipid panel   Gout    Last uric acid normal; check labs prior to next appt      Relevant Orders   Uric acid    Other Visit Diagnoses    Acute non-recurrent maxillary sinusitis    -   Primary   discussed risk of antibiotics, C diff; see AVS; patient may continue nasal rinses   Relevant Medications   amoxicillin-clavulanate (AUGMENTIN) 875-125 MG tablet       Follow up plan: No Follow-up on file.  An after-visit summary was printed and given to the patient at [facility].  Please see the patient instructions which may contain other information and recommendations beyond what is mentioned above in the assessment and plan.  Meds ordered this encounter  Medications  . amoxicillin-clavulanate (AUGMENTIN) 875-125 MG tablet    Sig: Take 1 tablet by mouth 2 (two) times daily.    Dispense:  20 tablet    Refill:  0    Tolerated augmentin in the past    Orders Placed This Encounter  Procedures  . COMPLETE METABOLIC PANEL WITH GFR  . Lipid panel  . Uric acid

## 2016-07-25 NOTE — Assessment & Plan Note (Signed)
Monitor Cr on the allopurinol; monitor SGPT on the statin

## 2016-07-25 NOTE — Assessment & Plan Note (Signed)
Last uric acid normal; check labs prior to next appt

## 2016-07-25 NOTE — Patient Instructions (Addendum)
Try to use PLAIN allergy medicine without the decongestant Avoid: phenylephrine, phenylpropanolamine, and pseudoephredine  Start the antibiotics Please do eat yogurt daily or take a probiotic daily for the next month or two We want to replace the healthy germs in the gut If you notice foul, watery diarrhea in the next two months, schedule an appointment RIGHT AWAY

## 2016-07-25 NOTE — Assessment & Plan Note (Signed)
Check lipids prior to next appt

## 2016-08-09 DIAGNOSIS — M7541 Impingement syndrome of right shoulder: Secondary | ICD-10-CM | POA: Diagnosis not present

## 2016-08-09 DIAGNOSIS — M75121 Complete rotator cuff tear or rupture of right shoulder, not specified as traumatic: Secondary | ICD-10-CM | POA: Diagnosis not present

## 2016-08-10 ENCOUNTER — Ambulatory Visit: Payer: PPO | Admitting: Family Medicine

## 2016-08-11 ENCOUNTER — Encounter: Payer: Self-pay | Admitting: Family Medicine

## 2016-08-11 ENCOUNTER — Ambulatory Visit (INDEPENDENT_AMBULATORY_CARE_PROVIDER_SITE_OTHER): Payer: PPO | Admitting: Family Medicine

## 2016-08-11 DIAGNOSIS — I10 Essential (primary) hypertension: Secondary | ICD-10-CM | POA: Diagnosis not present

## 2016-08-11 DIAGNOSIS — E785 Hyperlipidemia, unspecified: Secondary | ICD-10-CM | POA: Diagnosis not present

## 2016-08-11 DIAGNOSIS — F5101 Primary insomnia: Secondary | ICD-10-CM

## 2016-08-11 DIAGNOSIS — Z5181 Encounter for therapeutic drug level monitoring: Secondary | ICD-10-CM

## 2016-08-11 DIAGNOSIS — M1000 Idiopathic gout, unspecified site: Secondary | ICD-10-CM

## 2016-08-11 LAB — LIPID PANEL
Cholesterol: 177 mg/dL (ref <200)
HDL: 52 mg/dL (ref >40)
LDL CALC: 101 mg/dL — AB (ref <100)
Total CHOL/HDL Ratio: 3.4 Ratio (ref <5.0)
Triglycerides: 122 mg/dL (ref <150)
VLDL: 24 mg/dL (ref <30)

## 2016-08-11 LAB — COMPLETE METABOLIC PANEL WITH GFR
AG RATIO: 1.7 ratio (ref 1.0–2.5)
ALK PHOS: 55 U/L (ref 40–115)
ALT: 31 U/L (ref 9–46)
AST: 28 U/L (ref 10–35)
Albumin: 4.3 g/dL (ref 3.6–5.1)
BILIRUBIN TOTAL: 0.6 mg/dL (ref 0.2–1.2)
BUN/Creatinine Ratio: 22.4 Ratio — ABNORMAL HIGH (ref 6–22)
BUN: 24 mg/dL (ref 7–25)
CO2: 28 mmol/L (ref 20–31)
Calcium: 9.5 mg/dL (ref 8.6–10.3)
Chloride: 104 mmol/L (ref 98–110)
Creat: 1.07 mg/dL (ref 0.70–1.25)
GFR, EST AFRICAN AMERICAN: 84 mL/min (ref >=60)
GFR, EST NON AFRICAN AMERICAN: 72 mL/min (ref >=60)
GLUCOSE: 95 mg/dL (ref 65–99)
Globulin: 2.5 g/dL (ref 1.9–3.7)
POTASSIUM: 5 mmol/L (ref 3.5–5.3)
SODIUM: 139 mmol/L (ref 135–146)
TOTAL PROTEIN: 6.8 g/dL (ref 6.1–8.1)

## 2016-08-11 LAB — URIC ACID: URIC ACID, SERUM: 5.8 mg/dL (ref 4.0–8.0)

## 2016-08-11 NOTE — Progress Notes (Signed)
BP 118/76   Pulse 60   Temp 97.8 F (36.6 C) (Oral)   Resp 16   Wt 176 lb 3.2 oz (79.9 kg)   SpO2 97%   BMI 26.02 kg/m    Subjective:    Patient ID: Victor Knight, male    DOB: 06-12-1950, 66 y.o.   MRN: [id number]  HPI: Victor Knight is a 66 y.o. male  Chief Complaint  Patient presents with  . Follow-up    3 month   HPI High cholesterol He might eat fewer eggs in a week, 2-3 a week Came fasting for labs Not many fatty meats Getting fiber, not much cereal, more brown rice, vegetables  Blood pressure Well-controlled; used to check Had it checked at dental school, BPs were 142/96, 132/85, 141/97; they advised having this having  Walked a fair distance to get there, half a mile  Gout, no flares in 3 years; triggers are pork and red meat and alcohol; cut it all out  Insomnia; had negative sleep study; sleeping 7 hours on the trazodone now; off of ambien  Depression screen [facility] 2/9 08/11/2016 07/25/2016 05/12/2016 01/31/2016 10/05/2015  Decreased Interest 0 0 0 0 0  Down, Depressed, Hopeless 0 0 0 0 0  PHQ - 2 Score 0 0 0 0 0   Relevant past medical, surgical, family and social history reviewed Past Medical History:  Diagnosis Date  . Abdominal aortic atherosclerosis ([place]) 10/27/2015  . Aortic ectasia, abdominal ([place]) 10/27/2015  . Gout   . Hyperlipidemia   . Hypertension   . Insomnia    Past Surgical History:  Procedure Laterality Date  . CARPAL TUNNEL RELEASE    . rotator cuff surgery   04/20/2016   right arm; [facility]   . TRIGGER FINGER RELEASE     Family History  Problem Relation Age of Onset  . Hypertension Father   . Heart disease Father   . Hypertension Brother   . Hypertension Sister   . Supraventricular tachycardia Daughter   . Heart disease Daughter   . Hypertension Brother   . Hypertension Brother   . Hypertension Brother   . Heart disease Brother    Social History  Substance Use Topics  . Smoking status: Former Research scientist (life sciences)  . Smokeless  tobacco: Never Used  . Alcohol use 0.0 oz/week  quit smoking 40 years ago  Interim medical history since last visit reviewed. Allergies and medications reviewed  Review of Systems Per HPI unless specifically indicated above     Objective:    BP 118/76   Pulse 60   Temp 97.8 F (36.6 C) (Oral)   Resp 16   Wt 176 lb 3.2 oz (79.9 kg)   SpO2 97%   BMI 26.02 kg/m   Wt Readings from Last 3 Encounters:  08/11/16 176 lb 3.2 oz (79.9 kg)  07/25/16 179 lb 3.2 oz (81.3 kg)  05/12/16 174 lb 2 oz (79 kg)    Physical Exam  Constitutional: He appears well-developed and well-nourished. No distress.  HENT:  Right Ear: External ear normal.  Left Ear: External ear normal.  Nose: Rhinorrhea present. No mucosal edema. Right sinus exhibits no maxillary sinus tenderness and no frontal sinus tenderness. Left sinus exhibits no maxillary sinus tenderness and no frontal sinus tenderness.  Mouth/Throat: Oropharynx is clear and moist and mucous membranes are normal. No oropharyngeal exudate.  Eyes: Right eye exhibits no discharge. Left eye exhibits no discharge.  Cardiovascular: Normal rate and regular rhythm.   Pulmonary/Chest:  Effort normal and breath sounds normal.  Abdominal: He exhibits no distension.  Lymphadenopathy:    He has no cervical adenopathy.   Results for orders placed or performed in visit on 12/30/15  ALT  Result Value Ref Range   ALT 29 9 - 46 U/L  Lipid panel  Result Value Ref Range   Cholesterol 188 125 - 200 mg/dL   Triglycerides 126 <150 mg/dL   HDL 59 >=40 mg/dL   Total CHOL/HDL Ratio 3.2 <=5.0 Ratio   VLDL 25 <30 mg/dL   LDL Cholesterol 104 <130 mg/dL      Assessment & Plan:   Problem List Items Addressed This Visit      Cardiovascular and Mediastinum   Hypertension (Chronic)    wellc-ontrolled today; avoid decongesatnts, try to follow DASH guidelines        Other   Medication monitoring encounter    Check labs      Relevant Orders   COMPLETE  METABOLIC PANEL WITH GFR   Insomnia (Chronic)    He has stopped the ambien; using trazodone; sleep study and no sleep apnea      Hyperlipidemia LDL goal <70    Check labs; saturated fats limiting      Relevant Orders   Lipid panel   Gout    Avoiding triggers; check uric acid today      Relevant Orders   Uric acid    discussed Shingrix, he'll consider   Follow up plan: Return in about 6 months (around 02/10/2017) for twenty minute follow-up with fasting labs.  An after-visit summary was printed and given to the patient at [facility].  Please see the patient instructions which may contain other information and recommendations beyond what is mentioned above in the assessment and plan.  Meds ordered this encounter  Medications  . diclofenac (VOLTAREN) 75 MG EC tablet    Sig: Take 75 mg by mouth daily.    Orders Placed This Encounter  Procedures  . Lipid panel  . COMPLETE METABOLIC PANEL WITH GFR  . Uric acid

## 2016-08-11 NOTE — Patient Instructions (Addendum)
Try to follow the DASH guidelines (DASH stands for Dietary Approaches to Stop Hypertension) Try to limit the sodium in your diet.  Ideally, consume less than 1.5 grams (less than 1,500mg ) per day. Do not add salt when cooking or at the table.  Check the sodium amount on labels when shopping, and choose items lower in sodium when given a choice. Avoid or limit foods that already contain a lot of sodium. Eat a diet rich in fruits and vegetables and whole grains.  Hold NSAIDs the day before and of the dental appointments Try to use PLAIN allergy medicine without the decongestant Avoid: phenylephrine, phenylpropanolamine, and pseudoephredine  Try to limit saturated fats in your diet (bologna, hot dogs, barbeque, cheeseburgers, hamburgers, steak, bacon, sausage, cheese, etc.) and get more fresh fruits, vegetables, and whole grains

## 2016-08-11 NOTE — Assessment & Plan Note (Signed)
Check labs; saturated fats limiting

## 2016-08-11 NOTE — Assessment & Plan Note (Signed)
Check labs 

## 2016-08-11 NOTE — Assessment & Plan Note (Signed)
He has stopped the [place]; using trazodone; sleep study and no sleep apnea

## 2016-08-11 NOTE — Assessment & Plan Note (Signed)
wellc-ontrolled today; avoid decongesatnts, try to follow DASH guidelines

## 2016-08-11 NOTE — Assessment & Plan Note (Signed)
Avoiding triggers; check uric acid today

## 2016-08-16 ENCOUNTER — Other Ambulatory Visit: Payer: Self-pay | Admitting: Family Medicine

## 2016-08-16 MED ORDER — EZETIMIBE 10 MG PO TABS: 10.0000 mg | ORAL_TABLET | Freq: Every day | ORAL | 3 refills | Status: DC

## 2016-08-16 NOTE — Progress Notes (Signed)
Send zetia to pharmacy, fax

## 2016-09-05 DIAGNOSIS — H401131 Primary open-angle glaucoma, bilateral, mild stage: Secondary | ICD-10-CM | POA: Diagnosis not present

## 2016-09-08 ENCOUNTER — Other Ambulatory Visit: Payer: Self-pay | Admitting: Family Medicine

## 2016-09-10 NOTE — Telephone Encounter (Signed)
Reviewed sgpt and lipids; rx approved

## 2016-09-12 DIAGNOSIS — H401131 Primary open-angle glaucoma, bilateral, mild stage: Secondary | ICD-10-CM | POA: Diagnosis not present

## 2016-10-05 DIAGNOSIS — F5104 Psychophysiologic insomnia: Secondary | ICD-10-CM | POA: Diagnosis not present

## 2016-10-05 DIAGNOSIS — R0683 Snoring: Secondary | ICD-10-CM | POA: Diagnosis not present

## 2016-10-10 ENCOUNTER — Other Ambulatory Visit: Payer: Self-pay | Admitting: Family Medicine

## 2016-10-12 ENCOUNTER — Other Ambulatory Visit: Payer: Self-pay | Admitting: Family Medicine

## 2016-10-12 MED ORDER — TRAZODONE HCL 100 MG PO TABS: 50.0000 mg | ORAL_TABLET | Freq: Every evening | ORAL | 0 refills | Status: DC | PRN

## 2016-10-12 NOTE — Progress Notes (Signed)
Dr. Manuella Ghazi has seen the patient and advised to continue Trazodone 100 mg but to try to go down to 50 mg. Amber called the office and he was advised to see Dr. Manuella Ghazi on a prn basis, we will refill Trazodone for 30 days

## 2016-11-02 DIAGNOSIS — D485 Neoplasm of uncertain behavior of skin: Secondary | ICD-10-CM | POA: Diagnosis not present

## 2016-11-02 DIAGNOSIS — D1800 Hemangioma unspecified site: Secondary | ICD-10-CM | POA: Diagnosis not present

## 2016-11-02 DIAGNOSIS — Z1283 Encounter for screening for malignant neoplasm of skin: Secondary | ICD-10-CM | POA: Diagnosis not present

## 2016-11-02 DIAGNOSIS — L821 Other seborrheic keratosis: Secondary | ICD-10-CM | POA: Diagnosis not present

## 2016-11-02 DIAGNOSIS — L578 Other skin changes due to chronic exposure to nonionizing radiation: Secondary | ICD-10-CM | POA: Diagnosis not present

## 2016-11-02 DIAGNOSIS — D229 Melanocytic nevi, unspecified: Secondary | ICD-10-CM | POA: Diagnosis not present

## 2016-11-02 DIAGNOSIS — L812 Freckles: Secondary | ICD-10-CM | POA: Diagnosis not present

## 2016-11-02 DIAGNOSIS — L570 Actinic keratosis: Secondary | ICD-10-CM | POA: Diagnosis not present

## 2016-11-12 ENCOUNTER — Other Ambulatory Visit: Payer: Self-pay | Admitting: Family Medicine

## 2016-11-14 ENCOUNTER — Other Ambulatory Visit: Payer: Self-pay | Admitting: Family Medicine

## 2016-11-14 MED ORDER — TRAZODONE HCL 100 MG PO TABS: 50.0000 mg | ORAL_TABLET | Freq: Every evening | ORAL | 11 refills | Status: DC | PRN

## 2016-11-14 NOTE — Telephone Encounter (Signed)
I'm happy to give him a year's worth This does not require liver or kidney monitoring My colleague was covering for me last month which is why it was only for one month without any refills Please explain to him

## 2016-11-14 NOTE — Telephone Encounter (Signed)
PT IS NEEDING REFILL ON HIS TRAZDONE [facility] ON [address]. PT SAID THAT THE DR HAS BEEN DOING THIS FOR 5 MONTHS AT A TIME JUST LIKE ALL THE OTHERS.

## 2016-11-14 NOTE — Telephone Encounter (Signed)
Patient notified

## 2016-11-24 NOTE — Telephone Encounter (Signed)
What med is needed?  

## 2016-11-24 NOTE — Telephone Encounter (Signed)
Called patient no refills needed at this time

## 2017-01-05 ENCOUNTER — Encounter (INDEPENDENT_AMBULATORY_CARE_PROVIDER_SITE_OTHER): Payer: Self-pay

## 2017-01-26 IMAGING — US US AORTA SCREENING (MEDICARE)
1 series · 14 of 24 positions shown · non-contrast
Comparison: None.

CLINICAL DATA: Male between 65-75 years of age with a smoking
history.

EXAM:
US ABDOMINAL AORTA MEDICARE SCREENING
TECHNIQUE: Ultrasound examination of the abdominal aorta was performed as a
screening evaluation for abdominal aortic aneurysm.

[Series 1: us aorta screening (medicare) · 0.23mm/px · 14 of 24 slices shown]
[im 1/24]
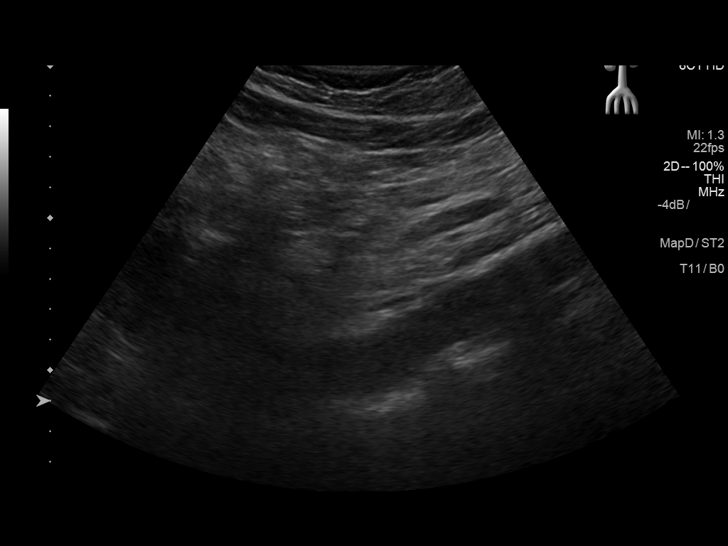
[im 3/24]
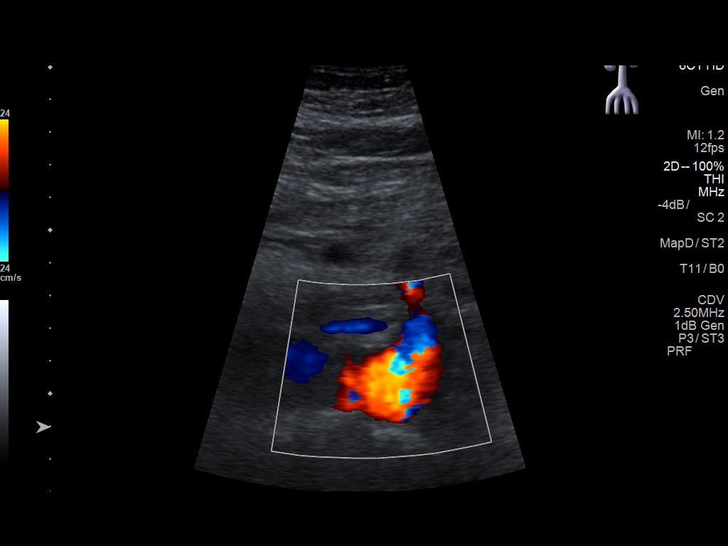
[im 5/24]
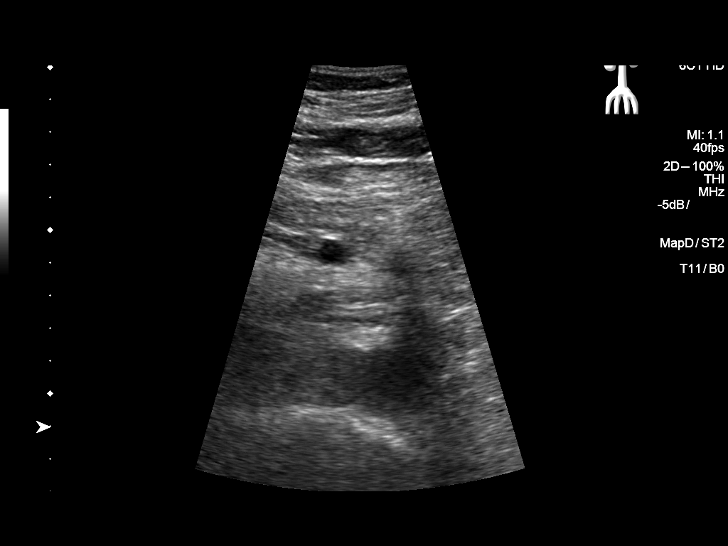
[im 7/24]
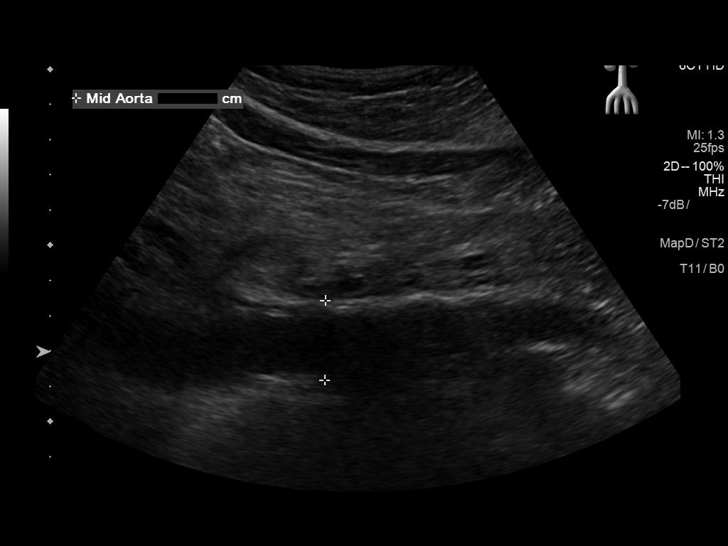
[im 8/24]
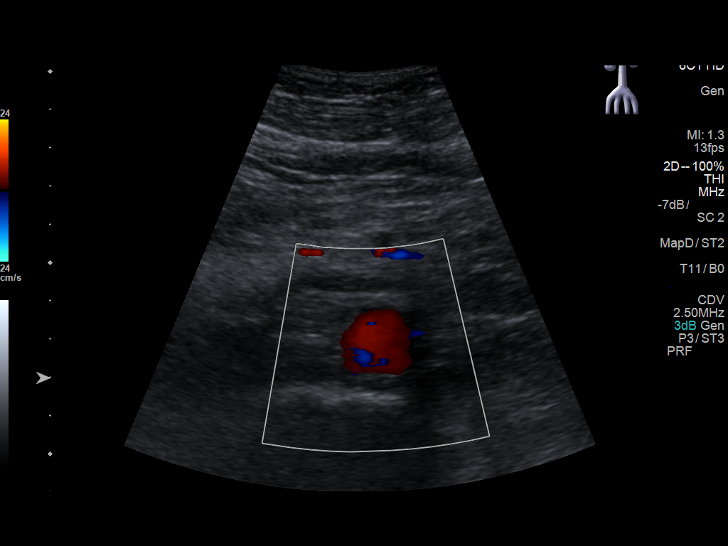
[im 10/24]
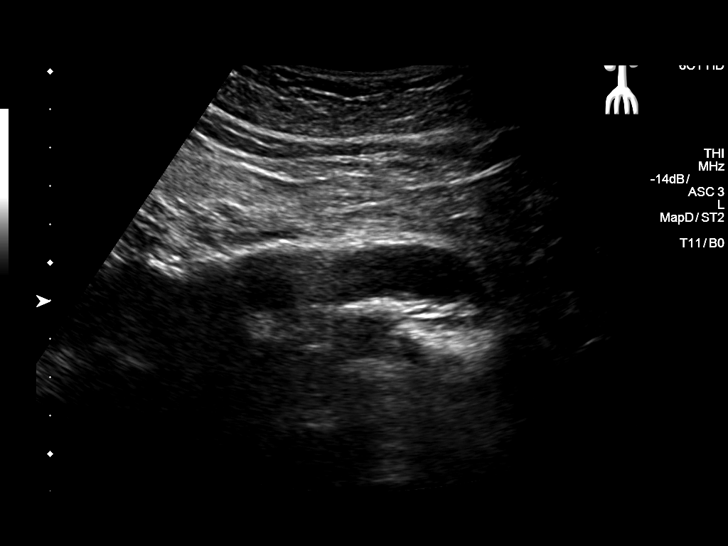
[im 12/24]
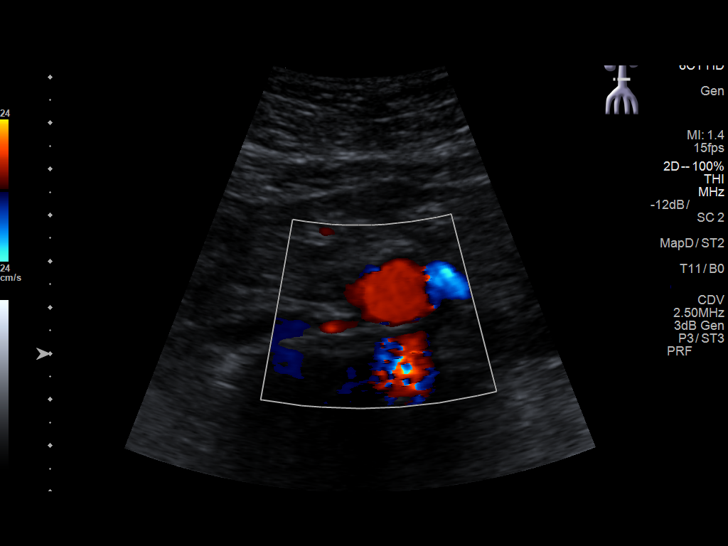
[im 13/24]
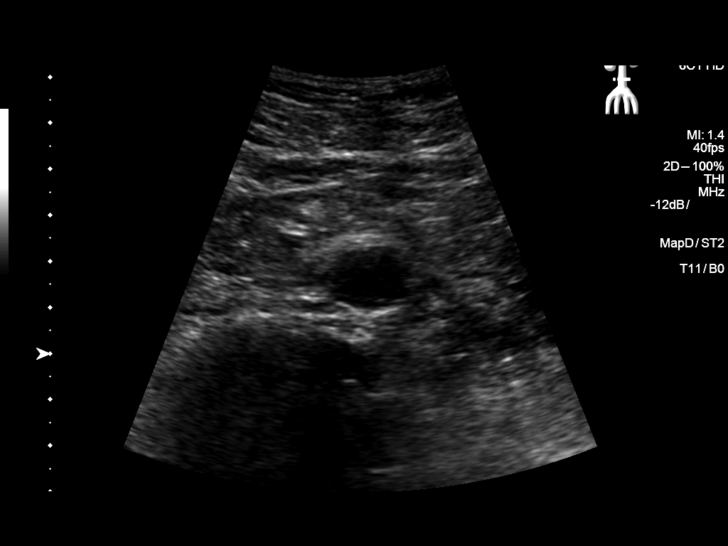
[im 15/24]
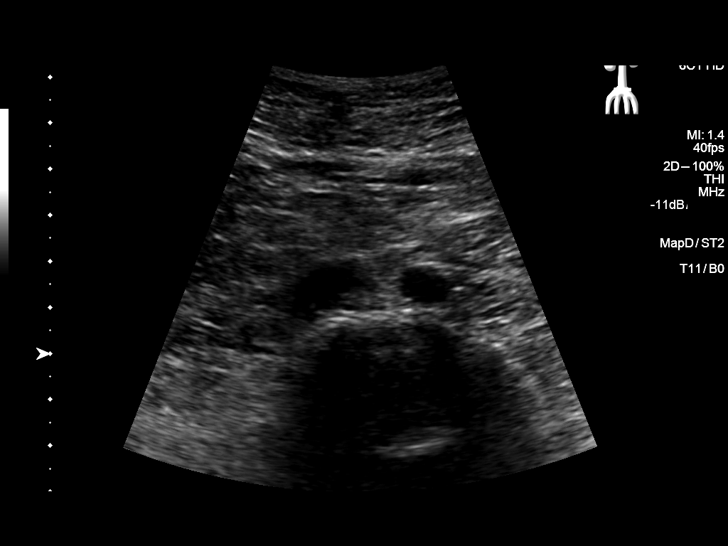
[im 17/24]
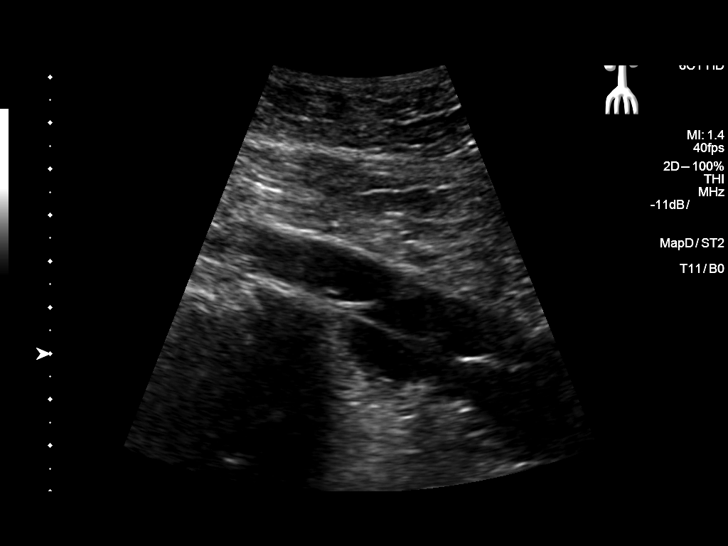
[im 19/24]
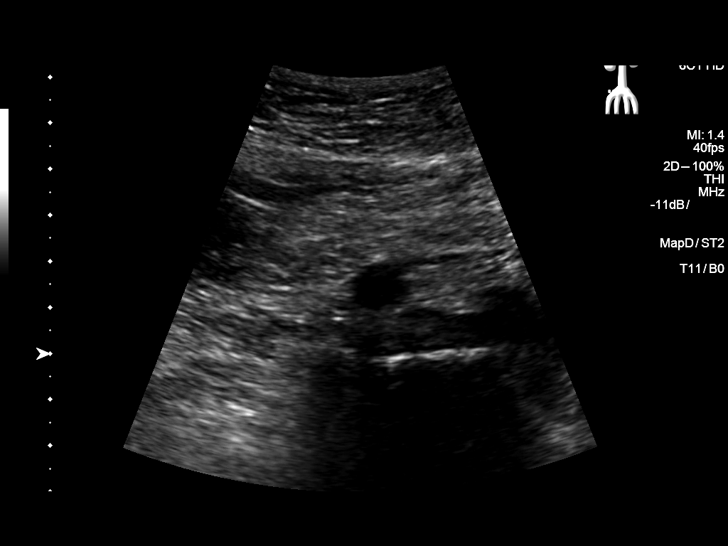
[im 20/24]
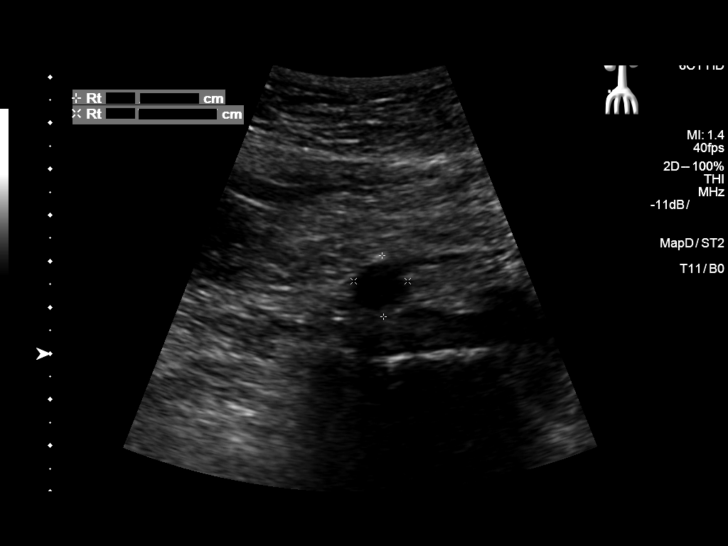
[im 22/24]
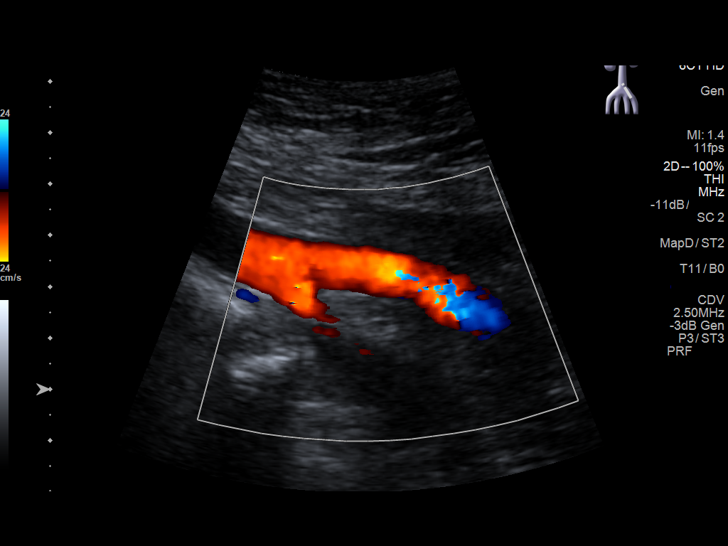
[im 24/24]
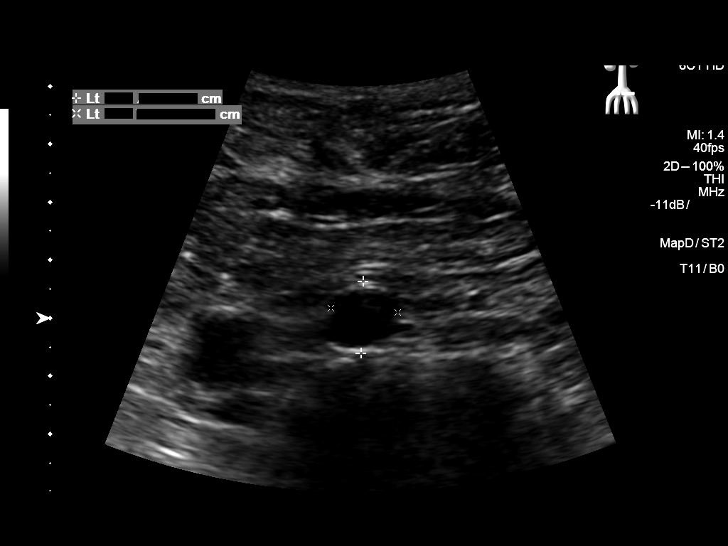

[14 of 24 positions shown; findings below may reference images not displayed]

FINDINGS: Maximum Diameter: 2.5 cm

ABDOMINAL AORTA

Proximal:  2.5 cm

Mid:  2.3 cm

Distal:  1.8 cm
IMPRESSION: Abdominal aortic atherosclerosis without aneurysm. Very minor aortic
ectasia.

Ectatic abdominal aorta at risk for aneurysm development. Recommend
followup by ultrasound in 5 years. This recommendation follows ACR
consensus guidelines: White Paper of the ACR Incidental Findings
Committee II on Vascular Findings. [HOSPITAL] 4952;

## 2017-02-12 ENCOUNTER — Ambulatory Visit (INDEPENDENT_AMBULATORY_CARE_PROVIDER_SITE_OTHER): Payer: PPO | Admitting: Family Medicine

## 2017-02-12 ENCOUNTER — Encounter: Payer: Self-pay | Admitting: Family Medicine

## 2017-02-12 DIAGNOSIS — F5101 Primary insomnia: Secondary | ICD-10-CM

## 2017-02-12 DIAGNOSIS — Z87891 Personal history of nicotine dependence: Secondary | ICD-10-CM

## 2017-02-12 DIAGNOSIS — M1000 Idiopathic gout, unspecified site: Secondary | ICD-10-CM

## 2017-02-12 DIAGNOSIS — Z5181 Encounter for therapeutic drug level monitoring: Secondary | ICD-10-CM | POA: Diagnosis not present

## 2017-02-12 DIAGNOSIS — E785 Hyperlipidemia, unspecified: Secondary | ICD-10-CM | POA: Diagnosis not present

## 2017-02-12 DIAGNOSIS — Z125 Encounter for screening for malignant neoplasm of prostate: Secondary | ICD-10-CM

## 2017-02-12 DIAGNOSIS — I10 Essential (primary) hypertension: Secondary | ICD-10-CM

## 2017-02-12 LAB — COMPLETE METABOLIC PANEL WITH GFR
AG RATIO: 1.8 (calc) (ref 1.0–2.5)
ALBUMIN MSPROF: 4.2 g/dL (ref 3.6–5.1)
ALT: 28 U/L (ref 9–46)
AST: 24 U/L (ref 10–35)
Alkaline phosphatase (APISO): 48 U/L (ref 40–115)
BUN: 19 mg/dL (ref 7–25)
CALCIUM: 9.1 mg/dL (ref 8.6–10.3)
CO2: 26 mmol/L (ref 20–32)
Chloride: 104 mmol/L (ref 98–110)
Creat: 0.99 mg/dL (ref 0.70–1.25)
GFR, EST AFRICAN AMERICAN: 92 mL/min/{1.73_m2} (ref >=60)
GFR, EST NON AFRICAN AMERICAN: 79 mL/min/{1.73_m2} (ref >=60)
GLOBULIN: 2.4 g/dL (ref 1.9–3.7)
Glucose, Bld: 97 mg/dL (ref 65–139)
POTASSIUM: 4.3 mmol/L (ref 3.5–5.3)
SODIUM: 139 mmol/L (ref 135–146)
TOTAL PROTEIN: 6.6 g/dL (ref 6.1–8.1)
Total Bilirubin: 0.6 mg/dL (ref 0.2–1.2)

## 2017-02-12 LAB — LIPID PANEL
Cholesterol: 181 mg/dL (ref <200)
HDL: 73 mg/dL (ref >40)
LDL Cholesterol (Calc): 90 mg/dL (calc)
NON-HDL CHOLESTEROL (CALC): 108 mg/dL (ref <130)
TRIGLYCERIDES: 85 mg/dL (ref <150)
Total CHOL/HDL Ratio: 2.5 (calc) (ref <5.0)

## 2017-02-12 LAB — PSA: PSA: 0.6 ng/mL (ref <=4.0)

## 2017-02-12 NOTE — Assessment & Plan Note (Signed)
Caution about upcoming holiday eating, [place] and gravy; consider tart cherry juice

## 2017-02-12 NOTE — Assessment & Plan Note (Signed)
Remote, no need for chest CT

## 2017-02-12 NOTE — Assessment & Plan Note (Signed)
Check PSA. ?

## 2017-02-12 NOTE — Patient Instructions (Addendum)
Consider Shingrix at your local pharmacy Keep up the good work with dietary modifications Try to limit saturated fats in your diet (bologna, hot dogs, barbeque, cheeseburgers, hamburgers, steak, bacon, sausage, cheese, etc.) and get more fresh fruits, vegetables, and whole grains

## 2017-02-12 NOTE — Assessment & Plan Note (Signed)
Trazodone working well

## 2017-02-12 NOTE — Progress Notes (Signed)
BP 124/70   Pulse 60   Temp (!) 97.5 F (36.4 C) (Oral)   Resp 16   Wt 184 lb 9.6 oz (83.7 kg)   SpO2 98%   BMI 27.26 kg/m    Subjective:    Patient ID: Victor Knight, male    DOB: 1951/01/30, 66 y.o.   MRN: [id number]  HPI: Victor Knight is a 66 y.o. male  Chief Complaint  Patient presents with  . Follow-up    6 month    HPI No medical excitement since last visit; overall he is doing well Flu shot UTD; he actually got the pneumonia and flu shot together, pharmacy, Sept 2018 Aortic atherosclerosis; reviewed last a/p; watching BP and chol High blood pressure; eating better; cut out of a lot of red meat and pork High cholesterol, cutting out meats as noted; tries to limit cheese; not many eggs any more, no more than 3 a week Lab Results  Component Value Date   CHOL 177 08/11/2016   HDL 52 08/11/2016   LDLCALC 101 (H) 08/11/2016   TRIG 122 08/11/2016   CHOLHDL 3.4 08/11/2016   Insomnia; chronic; using trazodone and that is working well; takes either 1 or 1.5 pills; off of ambien now Hx of smoking; 40 years ago, remove Gout; has really cut down on alcohol; no red wine any more; has helped; no recent flares in 4 years or more  Depression screen [facility] 2/9 02/12/2017 08/11/2016 07/25/2016 05/12/2016 01/31/2016  Decreased Interest 0 0 0 0 0  Down, Depressed, Hopeless 0 0 0 0 0  PHQ - 2 Score 0 0 0 0 0    Relevant past medical, surgical, family and social history reviewed Past Medical History:  Diagnosis Date  . Abdominal aortic atherosclerosis ([place]) 10/27/2015  . Aortic ectasia, abdominal ([place]) 10/27/2015  . Gout   . Hyperlipidemia   . Hypertension   . Insomnia    Past Surgical History:  Procedure Laterality Date  . CARPAL TUNNEL RELEASE    . rotator cuff surgery   04/20/2016   right arm; [facility]   . TRIGGER FINGER RELEASE     Family History  Problem Relation Age of Onset  . Hypertension Father   . Heart disease Father   . Hypertension Brother   .  Hypertension Sister   . Supraventricular tachycardia Daughter   . Heart disease Daughter   . Hypertension Brother   . Hypertension Brother   . Hypertension Brother   . Heart disease Brother    Social History   Social History  . Marital status: Married    Spouse name: N/A  . Number of children: N/A  . Years of education: N/A   Occupational History  . Not on file.   Social History Main Topics  . Smoking status: Former Research scientist (life sciences)  . Smokeless tobacco: Never Used  . Alcohol use 0.0 oz/week  . Drug use: No  . Sexual activity: Yes    Partners: Female   Other Topics Concern  . Not on file   Social History Narrative  . No narrative on file    Interim medical history since last visit reviewed. Allergies and medications reviewed  Review of Systems  Constitutional: Negative for unexpected weight change.  Cardiovascular: Negative for chest pain and leg swelling.  Gastrointestinal: Negative for blood in stool.  Genitourinary: Negative for hematuria.  Hematological: Does not bruise/bleed easily.   Per HPI unless specifically indicated above     Objective:  BP 124/70   Pulse 60   Temp (!) 97.5 F (36.4 C) (Oral)   Resp 16   Wt 184 lb 9.6 oz (83.7 kg)   SpO2 98%   BMI 27.26 kg/m   Wt Readings from Last 3 Encounters:  02/12/17 184 lb 9.6 oz (83.7 kg)  08/11/16 176 lb 3.2 oz (79.9 kg)  07/25/16 179 lb 3.2 oz (81.3 kg)    Physical Exam  Constitutional: He appears well-developed and well-nourished. No distress.  HENT:  Right Ear: Tympanic membrane, external ear and ear canal normal.  Left Ear: Tympanic membrane, external ear and ear canal normal.  Mouth/Throat: Oropharynx is clear and moist and mucous membranes are normal. No oropharyngeal exudate.  Eyes: Right eye exhibits no discharge. Left eye exhibits no discharge.  Cardiovascular: Normal rate and regular rhythm.   Pulmonary/Chest: Effort normal and breath sounds normal. No respiratory distress.  Abdominal: Soft.  Normal appearance and bowel sounds are normal. He exhibits no distension. There is no tenderness.  Lymphadenopathy:    He has no cervical adenopathy.  Skin: No pallor.  Psychiatric: His mood appears not anxious. He does not exhibit a depressed mood.    Results for orders placed or performed in visit on 08/11/16  Lipid panel  Result Value Ref Range   Cholesterol 177 <200 mg/dL   Triglycerides 122 <150 mg/dL   HDL 52 >40 mg/dL   Total CHOL/HDL Ratio 3.4 <5.0 Ratio   VLDL 24 <30 mg/dL   LDL Cholesterol 101 (H) <100 mg/dL  COMPLETE METABOLIC PANEL WITH GFR  Result Value Ref Range   Sodium 139 135 - 146 mmol/L   Potassium 5.0 3.5 - 5.3 mmol/L   Chloride 104 98 - 110 mmol/L   CO2 28 20 - 31 mmol/L   Glucose, Bld 95 65 - 99 mg/dL   BUN 24 7 - 25 mg/dL   Creat 1.07 0.70 - 1.25 mg/dL   Total Bilirubin 0.6 0.2 - 1.2 mg/dL   Alkaline Phosphatase 55 40 - 115 U/L   AST 28 10 - 35 U/L   ALT 31 9 - 46 U/L   Total Protein 6.8 6.1 - 8.1 g/dL   Albumin 4.3 3.6 - 5.1 g/dL   Calcium 9.5 8.6 - 10.3 mg/dL   Globulin 2.5 1.9 - 3.7 g/dL   AG Ratio 1.7 1.0 - 2.5 Ratio   BUN/Creatinine Ratio 22.4 (H) 6 - 22 Ratio   GFR, Est African American 84 >=60 mL/min   GFR, Est Non African American 72 >=60 mL/min  Uric acid  Result Value Ref Range   Uric Acid, Serum 5.8 4.0 - 8.0 mg/dL      Assessment & Plan:   Problem List Items Addressed This Visit      Cardiovascular and Mediastinum   Hypertension (Chronic)    Well-controlled; praise given for dietary changes        Other   Prostate cancer screening    Check PSA      Relevant Orders   PSA   Medication monitoring encounter    Check liver and kidneys      Relevant Orders   COMPLETE METABOLIC PANEL WITH GFR   Insomnia (Chronic)    Trazodone working well      Hyperlipidemia LDL goal <70    Check lipids today; glad to hear of dietary changes      Relevant Orders   Lipid panel   Hx of smoking    Remote, no need for chest CT  Gout    Caution about upcoming holiday eating, [place] and gravy; consider tart cherry juice         Follow up plan: Return in about 6 months (around 08/13/2017) for twenty minute follow-up with fasting labs.  An after-visit summary was printed and given to the patient at [place].  Please see the patient instructions which may contain other information and recommendations beyond what is mentioned above in the assessment and plan.  No orders of the defined types were placed in this encounter.   Orders Placed This Encounter  Procedures  . COMPLETE METABOLIC PANEL WITH GFR  . Lipid panel  . PSA

## 2017-02-12 NOTE — Assessment & Plan Note (Signed)
Check lipids today; glad to hear of dietary changes

## 2017-02-12 NOTE — Assessment & Plan Note (Signed)
Check liver and kidneys 

## 2017-02-12 NOTE — Assessment & Plan Note (Signed)
Well-controlled; praise given for dietary changes

## 2017-02-15 ENCOUNTER — Other Ambulatory Visit: Payer: Self-pay | Admitting: Family Medicine

## 2017-03-06 ENCOUNTER — Other Ambulatory Visit: Payer: Self-pay | Admitting: Family Medicine

## 2017-03-06 NOTE — Telephone Encounter (Signed)
Last sgpt and lipids reviewed; Rx approved 

## 2017-03-13 DIAGNOSIS — H401131 Primary open-angle glaucoma, bilateral, mild stage: Secondary | ICD-10-CM | POA: Diagnosis not present

## 2017-04-09 ENCOUNTER — Encounter: Payer: Self-pay | Admitting: Family Medicine

## 2017-04-09 ENCOUNTER — Ambulatory Visit (INDEPENDENT_AMBULATORY_CARE_PROVIDER_SITE_OTHER): Payer: PPO | Admitting: Family Medicine

## 2017-04-09 VITALS — BP 118/72 | HR 67 | Temp 98.3°F | Wt 180.4 lb

## 2017-04-09 DIAGNOSIS — J028 Acute pharyngitis due to other specified organisms: Principal | ICD-10-CM

## 2017-04-09 DIAGNOSIS — M720 Palmar fascial fibromatosis [Dupuytren]: Secondary | ICD-10-CM

## 2017-04-09 DIAGNOSIS — J029 Acute pharyngitis, unspecified: Secondary | ICD-10-CM

## 2017-04-09 DIAGNOSIS — M7550 Bursitis of unspecified shoulder: Secondary | ICD-10-CM

## 2017-04-09 DIAGNOSIS — M25519 Pain in unspecified shoulder: Secondary | ICD-10-CM | POA: Insufficient documentation

## 2017-04-09 DIAGNOSIS — M778 Other enthesopathies, not elsewhere classified: Secondary | ICD-10-CM

## 2017-04-09 DIAGNOSIS — M7500 Adhesive capsulitis of unspecified shoulder: Secondary | ICD-10-CM | POA: Insufficient documentation

## 2017-04-09 DIAGNOSIS — M7580 Other shoulder lesions, unspecified shoulder: Secondary | ICD-10-CM

## 2017-04-09 DIAGNOSIS — B9789 Other viral agents as the cause of diseases classified elsewhere: Secondary | ICD-10-CM

## 2017-04-09 HISTORY — DX: Adhesive capsulitis of unspecified shoulder: M75.00

## 2017-04-09 HISTORY — DX: Bursitis of unspecified shoulder: M75.50

## 2017-04-09 HISTORY — DX: Palmar fascial fibromatosis (dupuytren): M72.0

## 2017-04-09 HISTORY — DX: Other enthesopathies, not elsewhere classified: M77.8

## 2017-04-09 LAB — POCT RAPID STREP A (OFFICE): Rapid Strep A Screen: NEGATIVE

## 2017-04-09 NOTE — Patient Instructions (Addendum)
We'll see what your throat culture shows, but I believe your illness is caused by a virus If you have not heard anything from my staff by Friday about any orders/referrals/studies from today, please contact us here to follow-up [phone]    Pharyngitis Pharyngitis is redness, pain, and swelling (inflammation) of the throat (pharynx). It is a very common cause of sore throat. Pharyngitis can be caused by a bacteria, but it is usually caused by a virus. Most cases of pharyngitis get better on their own without treatment. What are the causes? This condition may be caused by:  Infection by viruses (viral). Viral pharyngitis spreads from person to person (is contagious) through coughing, sneezing, and sharing of personal items or utensils such as cups, forks, spoons, and toothbrushes.  Infection by bacteria (bacterial). Bacterial pharyngitis may be spread by touching the nose or face after coming in contact with the bacteria, or through more intimate contact, such as kissing.  Allergies. Allergies can cause buildup of mucus in the throat (post-nasal drip), leading to inflammation and irritation. Allergies can also cause blocked nasal passages, forcing breathing through the mouth, which dries and irritates the throat.  What increases the risk? You are more likely to develop this condition if:  You are 85-38 years old.  You are exposed to crowded environments such as daycare, school, or dormitory living.  You live in a cold climate.  You have a weakened disease-fighting (immune) system.  What are the signs or symptoms? Symptoms of this condition vary by the cause (viral, bacterial, or allergies) and can include:  Sore throat.  Fatigue.  Low-grade fever.  Headache.  Joint pain and muscle aches.  Skin rashes.  Swollen glands in the throat (lymph nodes).  Plaque-like film on the throat or tonsils. This is often a symptom of bacterial pharyngitis.  Vomiting.  Stuffy nose  (nasal congestion).  Cough.  Red, itchy eyes (conjunctivitis).  Loss of appetite.  How is this diagnosed? This condition is often diagnosed based on your medical history and a physical exam. Your health care provider will ask you questions about your illness and your symptoms. A swab of your throat may be done to check for bacteria (rapid strep test). Other lab tests may also be done, depending on the suspected cause, but these are rare. How is this treated? This condition usually gets better in 3-4 days without medicine. Bacterial pharyngitis may be treated with antibiotic medicines. Follow these instructions at home:  Take over-the-counter and prescription medicines only as told by your health care provider. ? If you were prescribed an antibiotic medicine, take it as told by your health care provider. Do not stop taking the antibiotic even if you start to feel better. ? Do not give children aspirin because of the association with Reye syndrome.  Drink enough water and fluids to keep your urine clear or pale yellow.  Get a lot of rest.  Gargle with a salt-water mixture 3-4 times a day or as needed. To make a salt-water mixture, completely dissolve -1 tsp of salt in 1 cup of warm water.  If your health care provider approves, you may use throat lozenges or sprays to soothe your throat. Contact a health care provider if:  You have large, tender lumps in your neck.  You have a rash.  You cough up green, yellow-brown, or bloody spit. Get help right away if:  Your neck becomes stiff.  You drool or are unable to swallow liquids.  You cannot drink  or take medicines without vomiting.  You have severe pain that does not go away, even after you take medicine.  You have trouble breathing, and it is not caused by a stuffy nose.  You have new pain and swelling in your joints such as the knees, ankles, wrists, or elbows. Summary  Pharyngitis is redness, pain, and swelling  (inflammation) of the throat (pharynx).  While pharyngitis can be caused by a bacteria, the most common causes are viral.  Most cases of pharyngitis get better on their own without treatment.  Bacterial pharyngitis is treated with antibiotic medicines. This information is not intended to replace advice given to you by your health care provider. Make sure you discuss any questions you have with your health care provider. Document Released: 04/03/2005 Document Revised: 05/09/2016 Document Reviewed: 05/09/2016 Elsevier Interactive Patient Education  [employer].

## 2017-04-09 NOTE — Progress Notes (Signed)
BP 118/72 (BP Location: Right Arm, Patient Position: Sitting, Cuff Size: Large)   Pulse 67   Temp 98.3 F (36.8 C) (Oral)   Wt 180 lb 6.4 oz (81.8 kg)   SpO2 99%   BMI 26.64 kg/m    Subjective:    Patient ID: Victor Knight, male    DOB: [birth year], 66 y.o.   MRN: [id number]  HPI: Victor Knight is a 66 y.o. male  Chief Complaint  Patient presents with  . Sore Throat    Began saturday, denies fever at home, no cough, no drainage,     HPI Patient is here for an acute visit Just sore throat, started yesterday; really that's his only symptoms No real cough to speak of, perhaps a little No fevers No rash No ear symptoms No travel Tried zinc and vitamin C and gargling for symptom relief No known sick contacts; avoids places with high rates of exposures like daycares  He has stopped his HCTZ; BP now controlled with diet  Depression screen [facility] 2/9 02/12/2017 08/11/2016 07/25/2016 05/12/2016 01/31/2016  Decreased Interest 0 0 0 0 0  Down, Depressed, Hopeless 0 0 0 0 0  PHQ - 2 Score 0 0 0 0 0    Relevant past medical, surgical, family and social history reviewed Past Medical History:  Diagnosis Date  . Abdominal aortic atherosclerosis ([place]) 10/27/2015  . Aortic ectasia, abdominal ([place]) 10/27/2015  . Gout   . Hyperlipidemia   . Hypertension   . Insomnia    Past Surgical History:  Procedure Laterality Date  . CARPAL TUNNEL RELEASE    . rotator cuff surgery   04/20/2016   right arm; [facility]   . TRIGGER FINGER RELEASE     Family History  Problem Relation Age of Onset  . Hypertension Father   . Heart disease Father   . Hypertension Brother   . Hypertension Sister   . Supraventricular tachycardia Daughter   . Heart disease Daughter   . Hypertension Brother   . Hypertension Brother   . Hypertension Brother   . Heart disease Brother    Social History   Tobacco Use  . Smoking status: Former Research scientist (life sciences)  . Smokeless tobacco: Never Used  Substance Use Topics  .  Alcohol use: Yes    Alcohol/week: 0.0 oz  . Drug use: No    Interim medical history since last visit reviewed. Allergies and medications reviewed  Review of Systems Per HPI unless specifically indicated above     Objective:    BP 118/72 (BP Location: Right Arm, Patient Position: Sitting, Cuff Size: Large)   Pulse 67   Temp 98.3 F (36.8 C) (Oral)   Wt 180 lb 6.4 oz (81.8 kg)   SpO2 99%   BMI 26.64 kg/m   Wt Readings from Last 3 Encounters:  04/09/17 180 lb 6.4 oz (81.8 kg)  02/12/17 184 lb 9.6 oz (83.7 kg)  08/11/16 176 lb 3.2 oz (79.9 kg)    Physical Exam  Constitutional: He appears well-developed and well-nourished. No distress.  Eyes: No scleral icterus.  Cardiovascular: Normal rate and regular rhythm.  Pulmonary/Chest: Effort normal and breath sounds normal.  Lymphadenopathy:    He has no cervical adenopathy.       Right cervical: No superficial cervical and no posterior cervical adenopathy present.      Left cervical: No superficial cervical and no posterior cervical adenopathy present.  Neurological: He is alert.  Skin: No rash noted. He is not diaphoretic. No  pallor.      Assessment & Plan:   Problem List Items Addressed This Visit    None    Visit Diagnoses    Sore throat (viral)    -  Primary   Relevant Orders   POCT rapid strep A (Completed)   Culture, Group A Strep      Follow up plan: Return if symptoms worsen or fail to improve.  An after-visit summary was printed and given to the patient at [place].  Please see the patient instructions which may contain other information and recommendations beyond what is mentioned above in the assessment and plan.  No orders of the defined types were placed in this encounter.   Orders Placed This Encounter  Procedures  . Culture, Group A Strep  . POCT rapid strep A    Victor Knight is a 66 y.o. male presenting with a sore throat for one day.  Associated symptoms include:  none.  Symptoms are  stable.  Home treatment thus far includes: gargling, zinc, vit C No known sick contacts with similar symptoms.

## 2017-04-10 LAB — CULTURE, GROUP A STREP
MICRO NUMBER:: 81445960
SPECIMEN QUALITY: ADEQUATE

## 2017-04-11 ENCOUNTER — Telehealth: Payer: Self-pay | Admitting: Family Medicine

## 2017-04-11 MED ORDER — AZITHROMYCIN 250 MG PO TABS: ORAL_TABLET | ORAL | 0 refills | Status: AC

## 2017-04-11 MED ORDER — PREDNISONE 20 MG PO TABS: 20.0000 mg | ORAL_TABLET | Freq: Every day | ORAL | 0 refills | Status: DC

## 2017-04-11 MED ORDER — LIDOCAINE VISCOUS 2 % MT SOLN: 5.0000 mL | OROMUCOSAL | 0 refills | Status: DC | PRN

## 2017-04-11 NOTE — Telephone Encounter (Signed)
Patient is calling to let provider know that his throat is not better. He is actually worse. The salt water, chloraseptic and throat lozenges are not working. It hurts to swallow. He can come in again if needed- but he is hurting and needs something. Coughing has picked up as well.

## 2017-04-11 NOTE — Telephone Encounter (Signed)
I called patient Pain is waking him up Fever last night He feels like there is some swelling; does not feel like airway is compromised; if worse, go to ER Start antibiotics, prednisone, use viscous lidocaine appt tomorrow available if desired He'll see how he feels; welcome to come, may cancel if not needed

## 2017-04-11 NOTE — Addendum Note (Signed)
Addended by: LADA, Satira Anis on: 04/11/2017 05:12 PM   Modules accepted: Orders

## 2017-04-12 ENCOUNTER — Ambulatory Visit: Payer: Self-pay | Admitting: Family Medicine

## 2017-04-25 ENCOUNTER — Ambulatory Visit (INDEPENDENT_AMBULATORY_CARE_PROVIDER_SITE_OTHER): Payer: PPO | Admitting: Family Medicine

## 2017-04-25 ENCOUNTER — Encounter: Payer: Self-pay | Admitting: Family Medicine

## 2017-04-25 VITALS — BP 132/62 | HR 63 | Temp 98.5°F | Ht 68.05 in | Wt 173.6 lb

## 2017-04-25 DIAGNOSIS — I700 Atherosclerosis of aorta: Secondary | ICD-10-CM | POA: Diagnosis not present

## 2017-04-25 DIAGNOSIS — Z8619 Personal history of other infectious and parasitic diseases: Secondary | ICD-10-CM | POA: Diagnosis not present

## 2017-04-25 DIAGNOSIS — Z0000 Encounter for general adult medical examination without abnormal findings: Secondary | ICD-10-CM | POA: Diagnosis not present

## 2017-04-25 DIAGNOSIS — Z87891 Personal history of nicotine dependence: Secondary | ICD-10-CM | POA: Diagnosis not present

## 2017-04-25 DIAGNOSIS — E785 Hyperlipidemia, unspecified: Secondary | ICD-10-CM | POA: Diagnosis not present

## 2017-04-25 MED ORDER — ASPIRIN EC 81 MG PO TBEC
81.0000 mg | DELAYED_RELEASE_TABLET | Freq: Every day | ORAL | Status: DC
Start: 2017-04-25 — End: 2019-05-15

## 2017-04-25 MED ORDER — ROSUVASTATIN CALCIUM 40 MG PO TABS: 40.0000 mg | ORAL_TABLET | Freq: Every day | ORAL | 0 refills | Status: DC

## 2017-04-25 NOTE — Patient Instructions (Addendum)
Start taking '81mg'$  Aspirin daily.  Please bring in your living will so that we can scan into our records. Preventive Care 67 Years and Older, Male Preventive care refers to lifestyle choices and visits with your health care provider that can promote health and wellness. What does preventive care include?  A yearly physical exam. This is also called an annual well check.  Dental exams once or twice a year.  Routine eye exams. Ask your health care provider how often you should have your eyes checked.  Personal lifestyle choices, including: ? Daily care of your teeth and gums. ? Regular physical activity. ? Eating a healthy diet. ? Avoiding tobacco and drug use. ? Limiting alcohol use. ? Practicing safe sex. ? Taking low doses of aspirin every day. ? Taking vitamin and mineral supplements as recommended by your health care provider. What happens during an annual well check? The services and screenings done by your health care provider during your annual well check will depend on your age, overall health, lifestyle risk factors, and family history of disease. Counseling Your health care provider may ask you questions about your:  Alcohol use.  Tobacco use.  Drug use.  Emotional well-being.  Home and relationship well-being.  Sexual activity.  Eating habits.  History of falls.  Memory and ability to understand (cognition).  Work and work Statistician.  Screening You may have the following tests or measurements:  Height, weight, and BMI.  Blood pressure.  Lipid and cholesterol levels. These may be checked every 5 years, or more frequently if you are over 39 years old.  Skin check.  Lung cancer screening. You may have this screening every year starting at age 26 if you have a 30-pack-year history of smoking and currently smoke or have quit within the past 15 years.  Fecal occult blood test (FOBT) of the stool. You may have this test every year starting at age  67.  Flexible sigmoidoscopy or colonoscopy. You may have a sigmoidoscopy every 5 years or a colonoscopy every 10 years starting at age 67.  Prostate cancer screening. Recommendations will vary depending on your family history and other risks.  Hepatitis C blood test.  Hepatitis B blood test.  Sexually transmitted disease (STD) testing.  Diabetes screening. This is done by checking your blood sugar (glucose) after you have not eaten for a while (fasting). You may have this done every 1-3 years.  Abdominal aortic aneurysm (AAA) screening. You may need this if you are a current or former smoker.  Osteoporosis. You may be screened starting at age 4 if you are at high risk.  Talk with your health care provider about your test results, treatment options, and if necessary, the need for more tests. Vaccines Your health care provider may recommend certain vaccines, such as:  Influenza vaccine. This is recommended every year.  Tetanus, diphtheria, and acellular pertussis (Tdap, Td) vaccine. You may need a Td booster every 10 years.  Varicella vaccine. You may need this if you have not been vaccinated.  Zoster vaccine. You may need this after age 67.  Measles, mumps, and rubella (MMR) vaccine. You may need at least one dose of MMR if you were born in 1957 or later. You may also need a second dose.  Pneumococcal 13-valent conjugate (PCV13) vaccine. One dose is recommended after age 67.  Pneumococcal polysaccharide (PPSV23) vaccine. One dose is recommended after age 67.  Meningococcal vaccine. You may need this if you have certain conditions.  Hepatitis A  vaccine. You may need this if you have certain conditions or if you travel or work in places where you may be exposed to hepatitis A.  Hepatitis B vaccine. You may need this if you have certain conditions or if you travel or work in places where you may be exposed to hepatitis B.  Haemophilus influenzae type b (Hib) vaccine. You may  need this if you have certain risk factors.  Talk to your health care provider about which screenings and vaccines you need and how often you need them. This information is not intended to replace advice given to you by your health care provider. Make sure you discuss any questions you have with your health care provider. Document Released: 04/30/2015 Document Revised: 12/22/2015 Document Reviewed: 02/02/2015 Elsevier Interactive Patient Education  [employer].

## 2017-04-25 NOTE — Progress Notes (Signed)
Name: Victor Knight   MRN: [id number]    DOB: 1950-12-20   Date:04/25/2017       Progress Note  Subjective  Chief Complaint  Chief Complaint  Patient presents with  . Annual Exam    HPI  Patient presents for annual CPE.  He is still recovering from a viral sore throat - states he is feeling much better, but still has lingering nasal drainage and cough.  USPSTF grade A and B recommendations:  Diet: Breakfast - cereal or oatmeal; Lunch - leftovers; Dinner - cooks a lot of chicken, fish, greens, soups, etc.; doesn't typically snack. Exercise: Walks about 2-3 times a week.  IPSS Questionnaire (AUA-7): Over the past month.   1)  How often have you had a sensation of not emptying your bladder completely after you finish urinating?  0 - Not at all  2)  How often have you had to urinate again less than two hours after you finished urinating? 1 - Less than 1 time in 5  3)  How often have you found you stopped and started again several times when you urinated?  1 - Less than 1 time in 5  4) How difficult have you found it to postpone urination?  3 - About half the time  5) How often have you had a weak urinary stream?  1 - Less than 1 time in 5  6) How often have you had to push or strain to begin urination?  1 - Less than 1 time in 5  7) How many times did you most typically get up to urinate from the time you went to bed until the time you got up in the morning?  0 - None  Total score:  0-7 mildly symptomatic   8-19 moderately symptomatic   20-35 severely symptomatic  Score of 7; Discussed guidelines and recommendations; PSA was normal on 02/12/2017.   Depression:  Depression screen [facility] 2/9 04/25/2017 02/12/2017 08/11/2016 07/25/2016 05/12/2016  Decreased Interest 0 0 0 0 0  Down, Depressed, Hopeless 0 0 0 0 0  PHQ - 2 Score 0 0 0 0 0    Hypertension: BP Readings from Last 3 Encounters:  04/25/17 132/62  04/09/17 118/72  02/12/17 124/70   Obesity: Wt Readings from Last 3  Encounters:  04/25/17 173 lb 9.6 oz (78.7 kg)  04/09/17 180 lb 6.4 oz (81.8 kg)  02/12/17 184 lb 9.6 oz (83.7 kg)   BMI Readings from Last 3 Encounters:  04/25/17 26.36 kg/m  04/09/17 26.64 kg/m  02/12/17 27.26 kg/m    Lipids: Has cut out most red meat and pork.  Per result notes from Dr. Sanda Klein (PCP) - we will switch to Crestor today and stop atorvastatin. Lab Results  Component Value Date   CHOL 181 02/12/2017   CHOL 177 08/11/2016   CHOL 188 12/30/2015   Lab Results  Component Value Date   HDL 73 02/12/2017   HDL 52 08/11/2016   HDL 59 12/30/2015   Lab Results  Component Value Date   LDLCALC 101 (H) 08/11/2016   LDLCALC 104 12/30/2015   LDLCALC 94 10/05/2015   Lab Results  Component Value Date   TRIG 85 02/12/2017   TRIG 122 08/11/2016   TRIG 126 12/30/2015   Lab Results  Component Value Date   CHOLHDL 2.5 02/12/2017   CHOLHDL 3.4 08/11/2016   CHOLHDL 3.2 12/30/2015   No results found for: LDLDIRECT Glucose: Normal Glucose, Bld  Date Value Ref Range Status  02/12/2017 97 65 - 139 mg/dL Final    Comment:    .        Non-fasting reference interval .   08/11/2016 95 65 - 99 mg/dL Final  10/05/2015 87 65 - 99 mg/dL Final    Alcohol: Drinks once every other day - red wine. Tobacco use: quit over 40 years ago. Smoked for <5 years total, smoked <1ppd.  Married  STD testing and prevention (chl/gon/syphilis): Declines HIV, hep C: Declines HIV. History of Hep C about 10 years ago and had treatment. We will recheck today.  Skin cancer: Sees Dermatologist twice yearly for full skin survey Colorectal cancer: No family history; no personal history.  Due for colonoscopy in 2025. Prostate cancer: PSA normal; AUA score of 7. Lab Results  Component Value Date   PSA 0.6 02/12/2017   PSA 0.68 10/05/2015   Lung cancer: Low Dose CT Chest recommended if Age 7-80 years, 30 pack-year currently smoking OR have quit w/in 15years. Patient does not qualify.  Denies  abnormal cough or shortness of breath, no abnormal weightloss AAA: The USPSTF recommends one-time screening with ultrasonography in men ages 45 to 45 years who have ever smoked.  Patient has this performed 10/26/2015.  Follow up recommended in 5 years - in 2022. Aspirin: Used to take daily, then stopped.  He has some at home and will start taking 81mg  daily. ECG:  Baseline from 2015 is on file.  Advanced Care Planning: A voluntary discussion about advance care planning including the explanation and discussion of advance directives.  Discussed health care proxy and Living will, and the patient was able to identify a health care proxy as Victor Knight (Wife).  Patient does have a living will at present time. If patient does have living will, I have requested they bring this to the clinic to be scanned in to their chart.  Patient Active Problem List   Diagnosis Date Noted  . Tendinitis of shoulder 04/09/2017  . Bursitis of shoulder 04/09/2017  . Shoulder pain 04/09/2017  . Adhesive capsulitis of shoulder 04/09/2017  . Dupuytren contracture 04/09/2017  . Complete tear of right rotator cuff 04/20/2016  . Preventative health care 12/05/2015  . Medication monitoring encounter 11/02/2015  . Abdominal aortic atherosclerosis ([place]) 10/27/2015  . Aortic ectasia, abdominal ([place]) 10/27/2015  . Hx of smoking 10/05/2015  . History of hepatitis C 10/05/2015  . Prostate cancer screening 10/05/2015  . Disease of liver 09/29/2015  . Hypertension 10/05/2014  . Insomnia 10/05/2014  . Hyperlipidemia LDL goal <70 10/05/2014  . Gout 10/05/2014    Past Surgical History:  Procedure Laterality Date  . CARPAL TUNNEL RELEASE    . rotator cuff surgery   04/20/2016   right arm; [facility]   . TRIGGER FINGER RELEASE      Family History  Problem Relation Age of Onset  . Hypertension Father   . Heart disease Father   . Hypertension Brother   . Hypertension Sister   . Supraventricular tachycardia Daughter   .  Heart disease Daughter   . Hypertension Brother   . Hypertension Brother   . Hypertension Brother   . Heart disease Brother     Social History   Socioeconomic History  . Marital status: Married    Spouse name: Not on file  . Number of children: Not on file  . Years of education: Not on file  . Highest education level: Not on file  Social Needs  . Financial resource strain: Not on file  .  Food insecurity - worry: Not on file  . Food insecurity - inability: Not on file  . Transportation needs - medical: Not on file  . Transportation needs - non-medical: Not on file  Occupational History  . Not on file  Tobacco Use  . Smoking status: Former Research scientist (life sciences)  . Smokeless tobacco: Never Used  Substance and Sexual Activity  . Alcohol use: Yes    Alcohol/week: 0.0 oz  . Drug use: No  . Sexual activity: Yes    Partners: Female  Other Topics Concern  . Not on file  Social History Narrative  . Not on file     Current Outpatient Medications:  .  allopurinol (ZYLOPRIM) 100 MG tablet, take 2 tablets by mouth once daily, Disp: 180 tablet, Rfl: 3 .  atenolol (TENORMIN) 100 MG tablet, take 1 tablet by mouth once daily, Disp: 30 tablet, Rfl: 11 .  atorvastatin (LIPITOR) 80 MG tablet, take 1 tablet by mouth at bedtime for cholesterol, Disp: 90 tablet, Rfl: 3 .  LUMIGAN 0.01 % SOLN, instill 1 drop into right eye once daily at bedtime, Disp: , Rfl: 0 .  traZODone (DESYREL) 100 MG tablet, Take 0.5-1 tablets (50-100 mg total) by mouth at bedtime as needed. for sleep, Disp: 30 tablet, Rfl: 11 .  colchicine 0.6 MG tablet, colchicine 0.6 mg tablet, Disp: , Rfl:  .  indomethacin (INDOCIN) 50 MG capsule, indomethacin 50 mg capsule, Disp: , Rfl:  .  lidocaine (XYLOCAINE) 2 % solution, Use as directed 5-10 mLs in the mouth or throat every 3 (three) hours as needed for mouth pain. Gargle, then spit out, Disp: 100 mL, Rfl: 0 .  predniSONE (DELTASONE) 20 MG tablet, Take 1 tablet (20 mg total) by mouth daily.  Take with food; no NSAIDs while on this medicine, Disp: 10 tablet, Rfl: 0  Allergies  Allergen Reactions  . Keflex [Cephalexin] Rash     ROS  Constitutional: Negative for fever or weight change.  Respiratory: Positive for cough; negative shortness of breath.   Cardiovascular: Negative for chest pain or palpitations.  Gastrointestinal: Negative for abdominal pain, no bowel changes (no blood in stool, dark/tarry stools, mucus in stools).  Musculoskeletal: Negative for gait problem or joint swelling.  Skin: Negative for rash.  Neurological: Negative for dizziness or headache.  No other specific complaints in a complete review of systems (except as listed in HPI above).  Objective  Vitals:   04/25/17 1046  BP: 132/62  Pulse: 63  Temp: 98.5 F (36.9 C)  TempSrc: Oral  SpO2: 99%  Weight: 173 lb 9.6 oz (78.7 kg)  Height: 5' 8.05" (1.728 m)    Body mass index is 26.36 kg/m.  Physical Exam Constitutional: Patient appears well-developed and well-nourished. No distress.  HENT: Head: Normocephalic and atraumatic. Ears: B TMs ok, no erythema or effusion; Nose: Nose normal. Mouth/Throat: Oropharynx is clear and moist. No oropharyngeal exudate.  Eyes: Conjunctivae and EOM are normal. Pupils are equal, round, and reactive to light. No scleral icterus.  Neck: Normal range of motion. Neck supple. No JVD present. No thyromegaly present, swallow is smooth.  Cardiovascular: Normal rate, regular rhythm and normal heart sounds.  No murmur heard. No BLE edema. Pulmonary/Chest: Effort normal and breath sounds normal. No respiratory distress. Abdominal: Soft. Bowel sounds are normal, no distension. There is no tenderness. no masses MALE GENITALIA: Deferred per pt RECTAL: Deferred per pt Musculoskeletal: Normal range of motion, no joint effusions. No gross deformities Neurological: he is alert and oriented to  person, place, and time. No cranial nerve deficit. Coordination, balance, strength,  speech and gait are normal.  Skin: Skin is warm and dry. No rash noted. No erythema.  Psychiatric: Patient has a normal mood and affect. behavior is normal. Judgment and thought content normal.  Recent Results (from the past 2160 hour(s))  COMPLETE METABOLIC PANEL WITH GFR     Status: None   Collection Time: 02/12/17  9:46 AM  Result Value Ref Range   Glucose, Bld 97 65 - 139 mg/dL    Comment: .        Non-fasting reference interval .    BUN 19 7 - 25 mg/dL   Creat 0.99 0.70 - 1.25 mg/dL    Comment: For patients >50 years of age, the reference limit for Creatinine is approximately 13% higher for people identified as African-American. .    GFR, Est Non African American 79 > OR = 60 mL/min/1.85m2   GFR, Est African American 92 > OR = 60 mL/min/1.33m2   BUN/Creatinine Ratio NOT APPLICABLE 6 - 22 (calc)   Sodium 139 135 - 146 mmol/L   Potassium 4.3 3.5 - 5.3 mmol/L   Chloride 104 98 - 110 mmol/L   CO2 26 20 - 32 mmol/L   Calcium 9.1 8.6 - 10.3 mg/dL   Total Protein 6.6 6.1 - 8.1 g/dL   Albumin 4.2 3.6 - 5.1 g/dL   Globulin 2.4 1.9 - 3.7 g/dL (calc)   AG Ratio 1.8 1.0 - 2.5 (calc)   Total Bilirubin 0.6 0.2 - 1.2 mg/dL   Alkaline phosphatase (APISO) 48 40 - 115 U/L   AST 24 10 - 35 U/L   ALT 28 9 - 46 U/L  Lipid panel     Status: None   Collection Time: 02/12/17  9:46 AM  Result Value Ref Range   Cholesterol 181 <200 mg/dL   HDL 73 >40 mg/dL   Triglycerides 85 <150 mg/dL   LDL Cholesterol (Calc) 90 mg/dL (calc)    Comment: Reference range: <100 . Desirable range <100 mg/dL for primary prevention;   <70 mg/dL for patients with CHD or diabetic patients  with > or = 2 CHD risk factors. Marland Kitchen LDL-C is now calculated using the Martin-Hopkins  calculation, which is a validated novel method providing  better accuracy than the Friedewald equation in the  estimation of LDL-C.  Cresenciano Genre et al. Annamaria Helling. 5726;203(55): 2061-2068  (http://education.[facility].com/faq/FAQ164)     Total CHOL/HDL Ratio 2.5 <5.0 (calc)   Non-HDL Cholesterol (Calc) 108 <130 mg/dL (calc)    Comment: For patients with diabetes plus 1 major ASCVD risk  factor, treating to a non-HDL-C goal of <100 mg/dL  (LDL-C of <70 mg/dL) is considered a therapeutic  option.   PSA     Status: None   Collection Time: 02/12/17  9:46 AM  Result Value Ref Range   PSA 0.6 < OR = 4.0 ng/mL    Comment: The total PSA value from this assay system is  standardized against the WHO standard. The test  result will be approximately 20% lower when compared  to the equimolar-standardized total PSA (Beckman  Coulter). Comparison of serial PSA results should be  interpreted with this fact in mind. . This test was performed using the Siemens  chemiluminescent method. Values obtained from  different assay methods cannot be used interchangeably. PSA levels, regardless of value, should not be interpreted as absolute evidence of the presence or absence of disease.   POCT rapid strep A  Status: Normal   Collection Time: 04/09/17  9:14 AM  Result Value Ref Range   Rapid Strep A Screen Negative Negative  Culture, Group A Strep     Status: None   Collection Time: 04/09/17  9:23 AM  Result Value Ref Range   MICRO NUMBER: [id number]    SPECIMEN QUALITY: ADEQUATE    SOURCE: THROAT    STATUS: FINAL    RESULT: No group A Streptococcus isolated    PHQ2/9: Depression screen [facility] 2/9 04/25/2017 02/12/2017 08/11/2016 07/25/2016 05/12/2016  Decreased Interest 0 0 0 0 0  Down, Depressed, Hopeless 0 0 0 0 0  PHQ - 2 Score 0 0 0 0 0   Fall Risk: Fall Risk  04/25/2017 02/12/2017 08/11/2016 07/25/2016 05/12/2016  Falls in the past year? No No No No No    Functional Status Survey: Is the patient deaf or have difficulty hearing?: No Does the patient have difficulty seeing, even when wearing glasses/contacts?: No Does the patient have difficulty concentrating, remembering, or making decisions?: No Does the patient have difficulty  walking or climbing stairs?: No Does the patient have difficulty dressing or bathing?: No Does the patient have difficulty doing errands alone such as visiting a doctor's office or shopping?: No  Assessment & Plan  1. Preventative health care -Prostate cancer screening and PSA options (with potential risks and benefits of testing vs not testing) were discussed along with recent recs/guidelines. -USPSTF grade A and B recommendations reviewed with patient; age-appropriate recommendations, preventive care, screening tests, etc discussed and encouraged; healthy living encouraged; see AVS for patient education given to patient -Discussed importance of 150 minutes of physical activity weekly, eat two servings of fish weekly, eat one serving of tree nuts ( cashews, pistachios, pecans, almonds.Marland Kitchen) every other day, eat 6 servings of fruit/vegetables daily and drink plenty of water and avoid sweet beverages.   2. Hyperlipidemia LDL goal <70 - aspirin EC 81 MG tablet; Take 1 tablet (81 mg total) by mouth daily. - rosuvastatin (CRESTOR) 40 MG tablet; Take 1 tablet (40 mg total) by mouth daily.  Dispense: 90 tablet; Refill: 0  3. History of hepatitis C - Hepatitis C antibody  4. Abdominal aortic atherosclerosis (HCC) - aspirin EC 81 MG tablet; Take 1 tablet (81 mg total) by mouth daily. - rosuvastatin (CRESTOR) 40 MG tablet; Take 1 tablet (40 mg total) by mouth daily.  Dispense: 90 tablet; Refill: 0  5. Hx of smoking - AAA Screen not due for repeat until 2022. - rosuvastatin (CRESTOR) 40 MG tablet; Take 1 tablet (40 mg total) by mouth daily.  Dispense: 90 tablet; Refill: 0  -Reviewed Health Maintenance: Vaccinations are UTD. - Return for Medicare AWV when available.

## 2017-04-28 LAB — HCV RNA,QUANTITATIVE REAL TIME PCR
HCV Quantitative Log: <1.18 Log IU/mL
HCV RNA, PCR, QN: NOT DETECTED [IU]/mL

## 2017-04-28 LAB — HEPATITIS C ANTIBODY
Hepatitis C Ab: REACTIVE — AB
SIGNAL TO CUT-OFF: 6.9 — ABNORMAL HIGH (ref <1.00)

## 2017-05-16 ENCOUNTER — Other Ambulatory Visit: Payer: Self-pay | Admitting: Family Medicine

## 2017-07-03 ENCOUNTER — Ambulatory Visit (INDEPENDENT_AMBULATORY_CARE_PROVIDER_SITE_OTHER): Payer: PPO | Admitting: Family Medicine

## 2017-07-03 ENCOUNTER — Encounter: Payer: Self-pay | Admitting: Family Medicine

## 2017-07-03 VITALS — BP 124/80 | HR 60 | Temp 97.8°F | Ht 68.5 in | Wt 181.9 lb

## 2017-07-03 DIAGNOSIS — Z66 Do not resuscitate: Secondary | ICD-10-CM | POA: Diagnosis not present

## 2017-07-03 DIAGNOSIS — Z0000 Encounter for general adult medical examination without abnormal findings: Secondary | ICD-10-CM

## 2017-07-03 NOTE — Patient Instructions (Addendum)
You will be eligible for the PPSV-23 pneumonia vaccine on or after Sept 24, 2019 You will be due for a repeat abdominal oartic ultrasound on or after October 25, 2020  Consider getting the new shingles vaccine called Shingrix; that is available for individuals 67 years of age and older, and is recommended even if you have had shingles in the past and/or already received the old shingles vaccine (Zostavax); it is a two-part series, and is available at many local pharmacies  Health Maintenance  Topic Date Due  . PNA vac Low Risk Adult (2 of 2 - PPSV23) 01/08/2018  . TETANUS/TDAP  05/27/2022  . COLONOSCOPY  04/18/2023  . INFLUENZA VACCINE  Completed  . Hepatitis C Screening  Completed    Health Maintenance, Male A healthy lifestyle and preventive care is important for your health and wellness. Ask your health care provider about what schedule of regular examinations is right for you. What should I know about weight and diet? Eat a Healthy Diet  Eat plenty of vegetables, fruits, whole grains, low-fat dairy products, and lean protein.  Do not eat a lot of foods high in solid fats, added sugars, or salt.  Maintain a Healthy Weight Regular exercise can help you achieve or maintain a healthy weight. You should:  Do at least 150 minutes of exercise each week. The exercise should increase your heart rate and make you sweat (moderate-intensity exercise).  Do strength-training exercises at least twice a week.  Watch Your Levels of Cholesterol and Blood Lipids  Have your blood tested for lipids and cholesterol every 5 years starting at 67 years of age. If you are at high risk for heart disease, you should start having your blood tested when you are 67 years old. You may need to have your cholesterol levels checked more often if: ? Your lipid or cholesterol levels are high. ? You are older than 67 years of age. ? You are at high risk for heart disease.  What should I know about cancer  screening? Many types of cancers can be detected early and may often be prevented. Lung Cancer  You should be screened every year for lung cancer if: ? You are a current smoker who has smoked for at least 30 years. ? You are a former smoker who has quit within the past 15 years.  Talk to your health care provider about your screening options, when you should start screening, and how often you should be screened.  Colorectal Cancer  Routine colorectal cancer screening usually begins at 67 years of age and should be repeated every 5-10 years until you are 67 years old. You may need to be screened more often if early forms of precancerous polyps or small growths are found. Your health care provider may recommend screening at an earlier age if you have risk factors for colon cancer.  Your health care provider may recommend using home test kits to check for hidden blood in the stool.  A small camera at the end of a tube can be used to examine your colon (sigmoidoscopy or colonoscopy). This checks for the earliest forms of colorectal cancer.  Prostate and Testicular Cancer  Depending on your age and overall health, your health care provider may do certain tests to screen for prostate and testicular cancer.  Talk to your health care provider about any symptoms or concerns you have about testicular or prostate cancer.  Skin Cancer  Check your skin from head to toe regularly.  Tell  your health care provider about any new moles or changes in moles, especially if: ? There is a change in a mole's size, shape, or color. ? You have a mole that is larger than a pencil eraser.  Always use sunscreen. Apply sunscreen liberally and repeat throughout the day.  Protect yourself by wearing long sleeves, pants, a wide-brimmed hat, and sunglasses when outside.  What should I know about heart disease, diabetes, and high blood pressure?  If you are 2-82 years of age, have your blood pressure checked  every 3-5 years. If you are 72 years of age or older, have your blood pressure checked every year. You should have your blood pressure measured twice-once when you are at a hospital or clinic, and once when you are not at a hospital or clinic. Record the average of the two measurements. To check your blood pressure when you are not at a hospital or clinic, you can use: ? An automated blood pressure machine at a pharmacy. ? A home blood pressure monitor.  Talk to your health care provider about your target blood pressure.  If you are between 59-67 years old, ask your health care provider if you should take aspirin to prevent heart disease.  Have regular diabetes screenings by checking your fasting blood sugar level. ? If you are at a normal weight and have a low risk for diabetes, have this test once every three years after the age of 30. ? If you are overweight and have a high risk for diabetes, consider being tested at a younger age or more often.  A one-time screening for abdominal aortic aneurysm (AAA) by ultrasound is recommended for men aged 61-75 years who are current or former smokers. What should I know about preventing infection? Hepatitis B If you have a higher risk for hepatitis B, you should be screened for this virus. Talk with your health care provider to find out if you are at risk for hepatitis B infection. Hepatitis C Blood testing is recommended for:  Everyone born from [birth year] through 1965.  Anyone with known risk factors for hepatitis C.  Sexually Transmitted Diseases (STDs)  You should be screened each year for STDs including gonorrhea and chlamydia if: ? You are sexually active and are younger than 67 years of age. ? You are older than 67 years of age and your health care provider tells you that you are at risk for this type of infection. ? Your sexual activity has changed since you were last screened and you are at an increased risk for chlamydia or gonorrhea. Ask your  health care provider if you are at risk.  Talk with your health care provider about whether you are at high risk of being infected with HIV. Your health care provider may recommend a prescription medicine to help prevent HIV infection.  What else can I do?  Schedule regular health, dental, and eye exams.  Stay current with your vaccines (immunizations).  Do not use any tobacco products, such as cigarettes, chewing tobacco, and e-cigarettes. If you need help quitting, ask your health care provider.  Limit alcohol intake to no more than 2 drinks per day. One drink equals 12 ounces of beer, 5 ounces of wine, or 1 ounces of hard liquor.  Do not use street drugs.  Do not share needles.  Ask your health care provider for help if you need support or information about quitting drugs.  Tell your health care provider if you often feel depressed.  Tell your health care provider if you have ever been abused or do not feel safe at home. This information is not intended to replace advice given to you by your health care provider. Make sure you discuss any questions you have with your health care provider. Document Released: 09/30/2007 Document Revised: 12/01/2015 Document Reviewed: 01/05/2015 Elsevier Interactive Patient Education  [employer].

## 2017-07-03 NOTE — Assessment & Plan Note (Signed)
USPSTF grade A and B recommendations reviewed with patient; age-appropriate recommendations, preventive care, screening tests, etc discussed and encouraged; healthy living encouraged; see AVS for patient education given to patient  

## 2017-07-03 NOTE — Progress Notes (Signed)
Patient: Victor Knight, Male    DOB: Mar 09, 1951, 67 y.o.   MRN: [id number]  Visit Date: 07/03/2017  Today's Provider: Enid Derry, MD   Chief Complaint  Patient presents with  . Medicare Wellness    Subjective:   Victor Knight is a 67 y.o. male who presents today for his Subsequent Annual Wellness Visit.  USPSTF grade A and B recommendations Depression:  Depression screen [facility] 2/9 07/03/2017 04/25/2017 02/12/2017 08/11/2016 07/25/2016  Decreased Interest 0 0 0 0 0  Down, Depressed, Hopeless 0 0 0 0 0  PHQ - 2 Score 0 0 0 0 0   Hypertension: BP Readings from Last 3 Encounters:  07/03/17 124/80  04/25/17 132/62  04/09/17 118/72   Obesity: not worried by the weight, usually around 180 pounds, not fluid overload Wt Readings from Last 3 Encounters:  07/03/17 181 lb 14.4 oz (82.5 kg)  04/25/17 173 lb 9.6 oz (78.7 kg)  04/09/17 180 lb 6.4 oz (81.8 kg)   BMI Readings from Last 3 Encounters:  07/03/17 27.26 kg/m  04/25/17 26.36 kg/m  04/09/17 26.64 kg/m    Immunizations: UTD, consider shingles vaccine at pharmacy; PPSV-23 in late Sept or after Skin cancer: no worrisome moles, sees derm Lung cancer:  Former smoker for just a few years and quit 40 years ago Prostate cancer: checked in October; no problems, not seeing urologist Lab Results  Component Value Date   PSA 0.6 02/12/2017   PSA 0.68 10/05/2015  Colorectal cancer: 2015; 10 year pass; no fam hx AAA: more than 100 cigs; next AAA [place] due in 2022 Aspirin: not taking aspirin daily; saw some report about aspirin; personal choice stopping it Diet: good eater; almond milk, spinach, kale, broccoli; limiting fatty saturated foods; cut out red meat almost Exercise: not doing much right now; works out outdoors; not liking going to gyms; walks outside Alcohol: very little; cut way back on that; 3 glasses of wine a week Tobacco use: remote HIV, hep B, hep C: already done Hep C STD testing and prevention (chl/gon/syphilis): not  interested Glucose:  Glucose, Bld  Date Value Ref Range Status  02/12/2017 97 65 - 139 mg/dL Final    Comment:    .        Non-fasting reference interval .   08/11/2016 95 65 - 99 mg/dL Final  10/05/2015 87 65 - 99 mg/dL Final   Lipids:  Lab Results  Component Value Date   CHOL 181 02/12/2017   CHOL 177 08/11/2016   CHOL 188 12/30/2015   Lab Results  Component Value Date   HDL 73 02/12/2017   HDL 52 08/11/2016   HDL 59 12/30/2015   Lab Results  Component Value Date   LDLCALC 90 02/12/2017   LDLCALC 101 (H) 08/11/2016   LDLCALC 104 12/30/2015   Lab Results  Component Value Date   TRIG 85 02/12/2017   TRIG 122 08/11/2016   TRIG 126 12/30/2015   Lab Results  Component Value Date   CHOLHDL 2.5 02/12/2017   CHOLHDL 3.4 08/11/2016   CHOLHDL 3.2 12/30/2015   No results found for: LDLDIRECT   Review of Systems  Past Medical History:  Diagnosis Date  . Abdominal aortic atherosclerosis ([place]) 10/27/2015  . Aortic ectasia, abdominal ([place]) 10/27/2015  . Gout   . Hyperlipidemia   . Hypertension   . Insomnia     Past Surgical History:  Procedure Laterality Date  . CARPAL TUNNEL RELEASE    . rotator cuff surgery  04/20/2016   right arm; [facility]   . TRIGGER FINGER RELEASE      Family History  Problem Relation Age of Onset  . Hypertension Father   . Heart disease Father   . Hypertension Brother   . Hypertension Sister   . Supraventricular tachycardia Daughter   . Heart disease Daughter   . Hypertension Brother   . Hypertension Brother   . Hypertension Brother   . Heart disease Brother     Social History   Tobacco Use  . Smoking status: Former Research scientist (life sciences)  . Smokeless tobacco: Never Used  Substance Use Topics  . Alcohol use: Yes    Alcohol/week: 0.0 oz  . Drug use: No    Outpatient Encounter Medications as of 07/03/2017  Medication Sig Note  . allopurinol (ZYLOPRIM) 100 MG tablet take 2 tablets by mouth once daily   . aspirin EC 81 MG tablet Take 1  tablet (81 mg total) by mouth daily.   Marland Kitchen atenolol (TENORMIN) 100 MG tablet take 1 tablet by mouth once daily   . atorvastatin (LIPITOR) 80 MG tablet Take 80 mg by mouth daily.    . colchicine 0.6 MG tablet colchicine 0.6 mg tablet 04/09/2017: Takes as needed for flare up   . diclofenac (VOLTAREN) 75 MG EC tablet Take by mouth.   . indomethacin (INDOCIN) 50 MG capsule indomethacin 50 mg capsule 04/09/2017: As needed for flare up   . LUMIGAN 0.01 % SOLN instill 1 drop into right eye once daily at bedtime 10/05/2014: Received from: External Pharmacy  . rosuvastatin (CRESTOR) 40 MG tablet Take 1 tablet (40 mg total) by mouth daily.   . traZODone (DESYREL) 100 MG tablet Take 0.5-1 tablets (50-100 mg total) by mouth at bedtime as needed. for sleep    No facility-administered encounter medications on file as of 07/03/2017.     Functional Ability / Safety Screening 1.  Was the timed Get Up and Go test less than 12 seconds?  yes 2.  Does the patient need help with the phone, transportation, shopping,      preparing meals, housework, laundry, medications, or managing money?  no 3.  Does the patient's home have:  loose throw rugs in the hallway?   no      Grab bars in the bathroom? no      Handrails on the stairs?   yes      Good lighting?   yes 4.  Has the patient noticed any hearing difficulties?   no  Advanced Directives Does patient have a HCPOA?    yes If yes, name and contact information: wife, Klayton Monie, [phone] Does patient have a living will or MOST form?  yes  Immunizations:   Fall Risk Assessment See under rooming  Depression Screen See under rooming Depression screen [facility] 2/9 07/03/2017 04/25/2017 02/12/2017 08/11/2016 07/25/2016  Decreased Interest 0 0 0 0 0  Down, Depressed, Hopeless 0 0 0 0 0  PHQ - 2 Score 0 0 0 0 0    Objective:   Vitals: BP 124/80 (BP Location: Left Arm, Patient Position: Sitting, Cuff Size: Large)   Pulse 60   Temp 97.8 F (36.6 C) (Oral)    Ht 5' 8.5" (1.74 m)   Wt 181 lb 14.4 oz (82.5 kg)   SpO2 99%   BMI 27.26 kg/m  Body mass index is 27.26 kg/m. No exam data present  Physical Exam  Constitutional: He appears well-developed and well-nourished.  Cardiovascular: Normal rate.  Pulmonary/Chest: Effort normal.  Psychiatric: He has a normal mood and affect.   Mood/affect:  euthymic Appearance:  Neatly, casually dressed  6CIT Screen 07/03/2017  What Year? 0 points  What month? 0 points  What time? 0 points  Count back from 20 0 points  Months in reverse 0 points  Repeat phrase 0 points  Total Score 0    Assessment & Plan:     Annual Wellness Visit  Reviewed patient's Family Medical History Reviewed and updated list of patient's medical providers Assessment of cognitive impairment was done Assessed patient's functional ability Established a written schedule for health screening [place] Completed and Reviewed  Exercise Activities and Dietary recommendations Goals    . DIET - INCREASE WATER INTAKE (pt-stated)       Immunization History  Administered Date(s) Administered  . Influenza-Unspecified 12/17/2015  . Pneumococcal Conjugate-13 10/05/2015, 01/08/2017    Health Maintenance  Topic Date Due  . PNA vac Low Risk Adult (2 of 2 - PPSV23) 01/08/2018  . TETANUS/TDAP  05/27/2022  . COLONOSCOPY  04/18/2023  . INFLUENZA VACCINE  Completed  . Hepatitis C Screening  Completed    Discussed health benefits of physical activity, and encouraged him to engage in regular exercise appropriate for his age and condition.   No orders of the defined types were placed in this encounter.   Current Outpatient Medications:  .  allopurinol (ZYLOPRIM) 100 MG tablet, take 2 tablets by mouth once daily, Disp: 180 tablet, Rfl: 3 .  aspirin EC 81 MG tablet, Take 1 tablet (81 mg total) by mouth daily., Disp: , Rfl:  .  atenolol (TENORMIN) 100 MG tablet, take 1 tablet by mouth once daily, Disp: 90  tablet, Rfl: 3 .  atorvastatin (LIPITOR) 80 MG tablet, Take 80 mg by mouth daily. , Disp: , Rfl: 0 .  colchicine 0.6 MG tablet, colchicine 0.6 mg tablet, Disp: , Rfl:  .  diclofenac (VOLTAREN) 75 MG EC tablet, Take by mouth., Disp: , Rfl:  .  indomethacin (INDOCIN) 50 MG capsule, indomethacin 50 mg capsule, Disp: , Rfl:  .  LUMIGAN 0.01 % SOLN, instill 1 drop into right eye once daily at bedtime, Disp: , Rfl: 0 .  rosuvastatin (CRESTOR) 40 MG tablet, Take 1 tablet (40 mg total) by mouth daily., Disp: 90 tablet, Rfl: 0 .  traZODone (DESYREL) 100 MG tablet, Take 0.5-1 tablets (50-100 mg total) by mouth at bedtime as needed. for sleep, Disp: 30 tablet, Rfl: 11 There are no discontinued medications.  Next Medicare Wellness Visit in 12+ months  Problem List Items Addressed This Visit      Other   Medicare annual wellness visit, subsequent - Primary    USPSTF grade A and B recommendations reviewed with patient; age-appropriate recommendations, preventive care, screening tests, etc discussed and encouraged; healthy living encouraged; see AVS for patient education given to patient       Other Visit Diagnoses    DNR (do not resuscitate)       Relevant Orders   DNR (Do Not Resuscitate)

## 2017-08-14 ENCOUNTER — Ambulatory Visit: Payer: PPO | Admitting: Family Medicine

## 2017-08-23 ENCOUNTER — Encounter: Payer: Self-pay | Admitting: Family Medicine

## 2017-08-23 ENCOUNTER — Ambulatory Visit (INDEPENDENT_AMBULATORY_CARE_PROVIDER_SITE_OTHER): Payer: PPO | Admitting: Family Medicine

## 2017-08-23 DIAGNOSIS — I700 Atherosclerosis of aorta: Secondary | ICD-10-CM | POA: Diagnosis not present

## 2017-08-23 DIAGNOSIS — Z5181 Encounter for therapeutic drug level monitoring: Secondary | ICD-10-CM | POA: Diagnosis not present

## 2017-08-23 DIAGNOSIS — Z87891 Personal history of nicotine dependence: Secondary | ICD-10-CM

## 2017-08-23 DIAGNOSIS — I10 Essential (primary) hypertension: Secondary | ICD-10-CM | POA: Diagnosis not present

## 2017-08-23 DIAGNOSIS — M1000 Idiopathic gout, unspecified site: Secondary | ICD-10-CM

## 2017-08-23 DIAGNOSIS — E785 Hyperlipidemia, unspecified: Secondary | ICD-10-CM

## 2017-08-23 MED ORDER — ROSUVASTATIN CALCIUM 40 MG PO TABS: 40.0000 mg | ORAL_TABLET | Freq: Every day | ORAL | 1 refills | Status: DC

## 2017-08-23 NOTE — Assessment & Plan Note (Signed)
Goal LDL is less than 70; aggressive statin therapy; strongly consider aspirin

## 2017-08-23 NOTE — Patient Instructions (Signed)
If you ever do need to take colchicine, just do NOT take your cholesterol medicine for 3 days Try to limit saturated fats in your diet (bologna, hot dogs, barbeque, cheeseburgers, hamburgers, steak, bacon, sausage, cheese, etc.) and get more fresh fruits, vegetables, and whole grains Try to follow the DASH guidelines (DASH stands for Dietary Approaches to Stop Hypertension). Try to limit the sodium in your diet to no more than 1,500mg  of sodium per day. Certainly try to not exceed 2,000 mg per day at the very most. Do not add salt when cooking or at the table.  Check the sodium amount on labels when shopping, and choose items lower in sodium when given a choice. Avoid or limit foods that already contain a lot of sodium. Eat a diet rich in fruits and vegetables and whole grains, and try to lose weight if overweight or obese Return for labs

## 2017-08-23 NOTE — Assessment & Plan Note (Signed)
Check uric acid. 

## 2017-08-23 NOTE — Assessment & Plan Note (Signed)
Check labs 

## 2017-08-23 NOTE — Progress Notes (Signed)
BP 112/62   Pulse 64   Temp 98.3 F (36.8 C) (Oral)   Resp 14   Ht 5\' 9"  (1.753 m)   Wt 181 lb 1.6 oz (82.1 kg)   SpO2 96%   BMI 26.74 kg/m    Subjective:    Patient ID: Victor Knight, male    DOB: 04-04-1951, 67 y.o.   MRN: [id number]  HPI: Victor Knight is a 67 y.o. male  Chief Complaint  Patient presents with  . Follow-up    HPI Here for f/u No medical excitement since last visit HTN; he is happy with BP medicines; no canned foods, no frozen pizzas, very little prepared foods; does not add any salt to anything High cholesterol; only on the rosuvastatin; two statins were in the med list, but this was corrected; he is only taking the one; eating better Lab Results  Component Value Date   CHOL 181 02/12/2017   HDL 73 02/12/2017   LDLCALC 90 02/12/2017   TRIG 85 02/12/2017   CHOLHDL 2.5 02/12/2017  Quit taking aspirin; 9.7% consider aspirin per aspirin calculator risk app, discussed in detail with patient Gout; no flares for years; avoiding high purine foods, limiting alcohol, "cut way back"  Depression screen [facility] 2/9 08/23/2017 07/03/2017 04/25/2017 02/12/2017 08/11/2016  Decreased Interest 0 0 0 0 0  Down, Depressed, Hopeless 0 0 0 0 0  PHQ - 2 Score 0 0 0 0 0    Relevant past medical, surgical, family and social history reviewed Past Medical History:  Diagnosis Date  . Abdominal aortic atherosclerosis ([place]) 10/27/2015  . Aortic ectasia, abdominal ([place]) 10/27/2015  . Gout   . Hyperlipidemia   . Hypertension   . Insomnia    Past Surgical History:  Procedure Laterality Date  . CARPAL TUNNEL RELEASE    . rotator cuff surgery   04/20/2016   right arm; [facility]   . TRIGGER FINGER RELEASE     Family History  Problem Relation Age of Onset  . Hypertension Father   . Heart disease Father   . Hypertension Brother   . Hypertension Sister   . Supraventricular tachycardia Daughter   . Heart disease Daughter   . Hypertension Brother   . Hypertension Brother   .  Hypertension Brother   . Heart disease Brother   MD note: father died at age 72; heart attack earlier in his life, maybe his early 71s; oldest brother died 31 months ago from atrial fibrillation and then he quit taking his medicine and may have had a stroke; older brother has pacemaker; no sudden cardiac death in anyone before age 84  Social History   Tobacco Use  . Smoking status: Former Research scientist (life sciences)  . Smokeless tobacco: Never Used  Substance Use Topics  . Alcohol use: Yes    Alcohol/week: 0.0 oz  . Drug use: No    Interim medical history since last visit reviewed. Allergies and medications reviewed  Review of Systems Per HPI unless specifically indicated above     Objective:    BP 112/62   Pulse 64   Temp 98.3 F (36.8 C) (Oral)   Resp 14   Ht 5\' 9"  (1.753 m)   Wt 181 lb 1.6 oz (82.1 kg)   SpO2 96%   BMI 26.74 kg/m   Wt Readings from Last 3 Encounters:  08/23/17 181 lb 1.6 oz (82.1 kg)  07/03/17 181 lb 14.4 oz (82.5 kg)  04/25/17 173 lb 9.6 oz (78.7 kg)  Physical Exam  Constitutional: He appears well-developed and well-nourished. No distress.  HENT:  Head: Normocephalic and atraumatic.  Eyes: EOM are normal. No scleral icterus.  Neck: No thyromegaly present.  Cardiovascular: Normal rate and regular rhythm.  Pulmonary/Chest: Effort normal and breath sounds normal.  Abdominal: Soft. Bowel sounds are normal. He exhibits no distension.  Musculoskeletal: He exhibits no edema.  Neurological: Coordination normal.  Skin: Skin is warm and dry. No pallor.  Psychiatric: He has a normal mood and affect. His behavior is normal. Judgment and thought content normal.    Results for orders placed or performed in visit on 04/25/17  Hepatitis C antibody  Result Value Ref Range   Hepatitis C Ab REACTIVE (A) NON-REACTI   SIGNAL TO CUT-OFF 6.90 (H) <1.00  HCV RNA, Quantitative Real Time PCR  Result Value Ref Range   HCV RNA, PCR, QN <15 NOT DETECTED NOT DETECT IU/mL   HCV  Quantitative Log <1.18 NOT DETECTED NOT DETECT Log IU/mL      Assessment & Plan:   Problem List Items Addressed This Visit      Cardiovascular and Mediastinum   Hypertension (Chronic)    Patient wishes to leave well enough alone; continue current meds; if starting to get too low, we'll reduce medicine; glad he is taking care of himself      Relevant Medications   rosuvastatin (CRESTOR) 40 MG tablet   Abdominal aortic atherosclerosis (HCC) (Chronic)    Goal LDL is less than 70; aggressive statin therapy; strongly consider aspirin      Relevant Medications   rosuvastatin (CRESTOR) 40 MG tablet   Other Relevant Orders   Lipid panel     Other   Hx of smoking   Relevant Medications   rosuvastatin (CRESTOR) 40 MG tablet   Medication monitoring encounter    Check labs      Relevant Orders   COMPLETE METABOLIC PANEL WITH GFR   Hyperlipidemia LDL goal <70 (Chronic)    Continue statin; check lipids; limit saturated fats      Relevant Medications   rosuvastatin (CRESTOR) 40 MG tablet   Gout    Check uric acid      Relevant Medications   colchicine 0.6 MG tablet   Other Relevant Orders   Uric acid       Follow up plan: Return in about 6 months (around 02/23/2018) for follow-up visit with Dr. Sanda Klein.  An after-visit summary was printed and given to the patient at [place].  Please see the patient instructions which may contain other information and recommendations beyond what is mentioned above in the assessment and plan.  Meds ordered this encounter  Medications  . rosuvastatin (CRESTOR) 40 MG tablet    Sig: Take 1 tablet (40 mg total) by mouth daily.    Dispense:  90 tablet    Refill:  1    Orders Placed This Encounter  Procedures  . Lipid panel  . COMPLETE METABOLIC PANEL WITH GFR  . Uric acid

## 2017-08-23 NOTE — Assessment & Plan Note (Signed)
Continue statin; check lipids; limit saturated fats

## 2017-08-23 NOTE — Assessment & Plan Note (Addendum)
Patient wishes to leave well enough alone; continue current meds; if starting to get too low, we'll reduce medicine; glad he is taking care of himself

## 2017-09-11 DIAGNOSIS — H401131 Primary open-angle glaucoma, bilateral, mild stage: Secondary | ICD-10-CM | POA: Diagnosis not present

## 2017-10-08 DIAGNOSIS — S91104A Unspecified open wound of right lesser toe(s) without damage to nail, initial encounter: Secondary | ICD-10-CM | POA: Diagnosis not present

## 2017-10-08 DIAGNOSIS — W540XXA Bitten by dog, initial encounter: Secondary | ICD-10-CM | POA: Diagnosis not present

## 2017-10-13 DIAGNOSIS — S91104S Unspecified open wound of right lesser toe(s) without damage to nail, sequela: Secondary | ICD-10-CM | POA: Diagnosis not present

## 2017-10-13 DIAGNOSIS — W540XXD Bitten by dog, subsequent encounter: Secondary | ICD-10-CM | POA: Diagnosis not present

## 2017-10-24 ENCOUNTER — Other Ambulatory Visit: Payer: Self-pay | Admitting: Family Medicine

## 2017-10-24 DIAGNOSIS — M1000 Idiopathic gout, unspecified site: Secondary | ICD-10-CM | POA: Diagnosis not present

## 2017-10-24 DIAGNOSIS — I700 Atherosclerosis of aorta: Secondary | ICD-10-CM | POA: Diagnosis not present

## 2017-10-24 DIAGNOSIS — H401131 Primary open-angle glaucoma, bilateral, mild stage: Secondary | ICD-10-CM | POA: Diagnosis not present

## 2017-10-24 DIAGNOSIS — Z5181 Encounter for therapeutic drug level monitoring: Secondary | ICD-10-CM | POA: Diagnosis not present

## 2017-10-24 LAB — LIPID PANEL
CHOL/HDL RATIO: 2.7 (calc) (ref <5.0)
CHOLESTEROL: 169 mg/dL (ref <200)
HDL: 63 mg/dL (ref >40)
LDL Cholesterol (Calc): 85 mg/dL (calc)
Non-HDL Cholesterol (Calc): 106 mg/dL (calc) (ref <130)
Triglycerides: 116 mg/dL (ref <150)

## 2017-10-24 LAB — COMPLETE METABOLIC PANEL WITH GFR
AG RATIO: 2 (calc) (ref 1.0–2.5)
ALBUMIN MSPROF: 4.5 g/dL (ref 3.6–5.1)
ALT: 40 U/L (ref 9–46)
AST: 29 U/L (ref 10–35)
Alkaline phosphatase (APISO): 43 U/L (ref 40–115)
BUN: 24 mg/dL (ref 7–25)
CALCIUM: 9.3 mg/dL (ref 8.6–10.3)
CO2: 27 mmol/L (ref 20–32)
CREATININE: 1.06 mg/dL (ref 0.70–1.25)
Chloride: 104 mmol/L (ref 98–110)
GFR, EST AFRICAN AMERICAN: 84 mL/min/{1.73_m2} (ref >=60)
GFR, EST NON AFRICAN AMERICAN: 72 mL/min/{1.73_m2} (ref >=60)
GLOBULIN: 2.3 g/dL (ref 1.9–3.7)
Glucose, Bld: 99 mg/dL (ref 65–99)
POTASSIUM: 4.5 mmol/L (ref 3.5–5.3)
SODIUM: 139 mmol/L (ref 135–146)
Total Bilirubin: 0.5 mg/dL (ref 0.2–1.2)
Total Protein: 6.8 g/dL (ref 6.1–8.1)

## 2017-10-24 LAB — URIC ACID: Uric Acid, Serum: 6 mg/dL (ref 4.0–8.0)

## 2017-10-24 MED ORDER — EZETIMIBE 10 MG PO TABS: 10.0000 mg | ORAL_TABLET | Freq: Every day | ORAL | 3 refills | Status: DC

## 2017-10-24 NOTE — Progress Notes (Signed)
Add zetia Continue crestor Check labs in Fall

## 2017-10-26 ENCOUNTER — Encounter: Payer: Self-pay | Admitting: Family Medicine

## 2017-11-05 DIAGNOSIS — Z1283 Encounter for screening for malignant neoplasm of skin: Secondary | ICD-10-CM | POA: Diagnosis not present

## 2017-11-05 DIAGNOSIS — L820 Inflamed seborrheic keratosis: Secondary | ICD-10-CM | POA: Diagnosis not present

## 2017-11-05 DIAGNOSIS — L570 Actinic keratosis: Secondary | ICD-10-CM | POA: Diagnosis not present

## 2017-11-05 DIAGNOSIS — D1800 Hemangioma unspecified site: Secondary | ICD-10-CM | POA: Diagnosis not present

## 2017-11-05 DIAGNOSIS — D229 Melanocytic nevi, unspecified: Secondary | ICD-10-CM | POA: Diagnosis not present

## 2017-11-05 DIAGNOSIS — L812 Freckles: Secondary | ICD-10-CM | POA: Diagnosis not present

## 2017-11-05 DIAGNOSIS — D225 Melanocytic nevi of trunk: Secondary | ICD-10-CM | POA: Diagnosis not present

## 2017-11-05 DIAGNOSIS — D485 Neoplasm of uncertain behavior of skin: Secondary | ICD-10-CM | POA: Diagnosis not present

## 2017-11-05 DIAGNOSIS — L720 Epidermal cyst: Secondary | ICD-10-CM | POA: Diagnosis not present

## 2017-11-05 DIAGNOSIS — D2230 Melanocytic nevi of unspecified part of face: Secondary | ICD-10-CM | POA: Diagnosis not present

## 2017-11-05 DIAGNOSIS — L578 Other skin changes due to chronic exposure to nonionizing radiation: Secondary | ICD-10-CM | POA: Diagnosis not present

## 2017-11-05 DIAGNOSIS — L821 Other seborrheic keratosis: Secondary | ICD-10-CM | POA: Diagnosis not present

## 2017-11-22 ENCOUNTER — Other Ambulatory Visit: Payer: Self-pay | Admitting: Family Medicine

## 2017-12-20 ENCOUNTER — Other Ambulatory Visit: Payer: Self-pay | Admitting: Family Medicine

## 2018-01-18 ENCOUNTER — Telehealth: Payer: Self-pay | Admitting: Family Medicine

## 2018-01-18 MED ORDER — TRAZODONE HCL 100 MG PO TABS: ORAL_TABLET | ORAL | 3 refills | Status: DC

## 2018-01-18 NOTE — Addendum Note (Signed)
Addended by: LADA, Satira Anis on: 01/18/2018 01:33 PM   Modules accepted: Orders

## 2018-01-18 NOTE — Telephone Encounter (Signed)
Copied from [place] [id number]. Topic: Quick Communication - Rx Refill/Question >> Jan 18, 2018  9:50 AM Oliver Pila B wrote: REQUESTED IN SEPTEMBER  Medication: traZODone (DESYREL) 100 MG tablet [[id number]]   Has the patient contacted their pharmacy? Yes.   (Agent: If no, request that the patient contact the pharmacy for the refill.) (Agent: If yes, when and what did the pharmacy advise?)  Preferred Pharmacy (with phone number or street name): [facility]  Agent: Please be advised that RX refills may take up to 3 business days. We ask that you follow-up with your pharmacy.

## 2018-02-22 ENCOUNTER — Ambulatory Visit (INDEPENDENT_AMBULATORY_CARE_PROVIDER_SITE_OTHER): Payer: PPO | Admitting: Family Medicine

## 2018-02-22 ENCOUNTER — Encounter: Payer: Self-pay | Admitting: Family Medicine

## 2018-02-22 VITALS — BP 128/78 | HR 60 | Temp 98.4°F | Ht 69.0 in | Wt 185.5 lb

## 2018-02-22 DIAGNOSIS — Z125 Encounter for screening for malignant neoplasm of prostate: Secondary | ICD-10-CM | POA: Diagnosis not present

## 2018-02-22 DIAGNOSIS — Z5181 Encounter for therapeutic drug level monitoring: Secondary | ICD-10-CM | POA: Diagnosis not present

## 2018-02-22 DIAGNOSIS — M1000 Idiopathic gout, unspecified site: Secondary | ICD-10-CM | POA: Diagnosis not present

## 2018-02-22 DIAGNOSIS — I10 Essential (primary) hypertension: Secondary | ICD-10-CM

## 2018-02-22 DIAGNOSIS — I700 Atherosclerosis of aorta: Secondary | ICD-10-CM

## 2018-02-22 DIAGNOSIS — E785 Hyperlipidemia, unspecified: Secondary | ICD-10-CM | POA: Diagnosis not present

## 2018-02-22 DIAGNOSIS — I77811 Abdominal aortic ectasia: Secondary | ICD-10-CM | POA: Diagnosis not present

## 2018-02-22 DIAGNOSIS — Z87891 Personal history of nicotine dependence: Secondary | ICD-10-CM | POA: Diagnosis not present

## 2018-02-22 DIAGNOSIS — F5101 Primary insomnia: Secondary | ICD-10-CM

## 2018-02-22 MED ORDER — ALLOPURINOL 100 MG PO TABS: 200.0000 mg | ORAL_TABLET | Freq: Every day | ORAL | 3 refills | Status: DC

## 2018-02-22 MED ORDER — ATENOLOL 100 MG PO TABS: 100.0000 mg | ORAL_TABLET | Freq: Every day | ORAL | 3 refills | Status: DC

## 2018-02-22 MED ORDER — ROSUVASTATIN CALCIUM 40 MG PO TABS: 40.0000 mg | ORAL_TABLET | Freq: Every day | ORAL | 3 refills | Status: DC

## 2018-02-22 MED ORDER — TRAZODONE HCL 100 MG PO TABS: ORAL_TABLET | ORAL | 3 refills | Status: DC

## 2018-02-22 NOTE — Assessment & Plan Note (Signed)
Well-controlled; continue meds, continue to limit sodium

## 2018-02-22 NOTE — Assessment & Plan Note (Signed)
Due for scan in July 2022

## 2018-02-22 NOTE — Assessment & Plan Note (Signed)
Goal LDL is less than 70; limit saturated fats  

## 2018-02-22 NOTE — Assessment & Plan Note (Signed)
Limit saturated fats, continues meds

## 2018-02-22 NOTE — Progress Notes (Signed)
BP 128/78   Pulse 60   Temp 98.4 F (36.9 C) (Oral)   Ht 5\' 9"  (1.753 m)   Wt 185 lb 8 oz (84.1 kg)   SpO2 99%   BMI 27.39 kg/m    Subjective:    Patient ID: Victor Knight, male    DOB: 06/23/1950, 67 y.o.   MRN: [id number]  HPI: Victor Knight is a 67 y.o. male  Chief Complaint  Patient presents with  . Follow-up    HPI Patient is here for f/u; since last visit, minor trauma to the right foot, okay now, will just lose some toenails  Hypertension; on medicines; checkings BP at home and similar numbers; trying to avoid salt and processed foods; going to reduced salt if applicable; not adding salt to anything   High cholesterol; on zetia and crestor; no aches; trying to limit saturated in foods  Gout; no flares recently; on allopurinol; red meats and alcohol are his triggers; he cut out red meats for the most part, way down on alcohol  Insomnia; trazodone is working well; getting about 7 hours of good sleep  He is not taking aspirin; read about the studies  Glaucoma; runs in family and on drops; no loss of vision  HM reviewed  Depression screen [facility] 2/9 02/22/2018 08/23/2017 07/03/2017 04/25/2017 02/12/2017  Decreased Interest 0 0 0 0 0  Down, Depressed, Hopeless 0 0 0 0 0  PHQ - 2 Score 0 0 0 0 0  Altered sleeping 0 - - - -  Tired, decreased energy 0 - - - -  Change in appetite 0 - - - -  Feeling bad or failure about yourself  0 - - - -  Trouble concentrating 0 - - - -  Moving slowly or fidgety/restless 0 - - - -  Suicidal thoughts 0 - - - -  PHQ-9 Score 0 - - - -  Difficult doing work/chores Not difficult at all - - - -   Fall Risk  02/22/2018 08/23/2017 07/03/2017 04/25/2017 02/12/2017  Falls in the past year? 0 No No No No  Number falls in past yr: 0 - - - -    Relevant past medical, surgical, family and social history reviewed Past Medical History:  Diagnosis Date  . Abdominal aortic atherosclerosis ([place]) 10/27/2015  . Aortic ectasia, abdominal ([place])  10/27/2015  . Gout   . Hyperlipidemia   . Hypertension   . Insomnia    Past Surgical History:  Procedure Laterality Date  . CARPAL TUNNEL RELEASE    . rotator cuff surgery   04/20/2016   right arm; [facility]   . TRIGGER FINGER RELEASE     Family History  Problem Relation Age of Onset  . Hypertension Father   . Heart disease Father   . Hypertension Brother   . Hypertension Sister   . Supraventricular tachycardia Daughter   . Heart disease Daughter   . Hypertension Brother   . Hypertension Brother   . Hypertension Brother   . Heart disease Brother    Social History   Tobacco Use  . Smoking status: Former Research scientist (life sciences)  . Smokeless tobacco: Never Used  Substance Use Topics  . Alcohol use: Yes    Alcohol/week: 0.0 standard drinks  . Drug use: No     [facility]  AUDIT-C Score  3      Interim medical history since last visit reviewed. Allergies and medications reviewed  Review  of Systems Per HPI unless specifically indicated above     Objective:    BP 128/78   Pulse 60   Temp 98.4 F (36.9 C) (Oral)   Ht 5\' 9"  (1.753 m)   Wt 185 lb 8 oz (84.1 kg)   SpO2 99%   BMI 27.39 kg/m   Wt Readings from Last 3 Encounters:  02/22/18 185 lb 8 oz (84.1 kg)  08/23/17 181 lb 1.6 oz (82.1 kg)  07/03/17 181 lb 14.4 oz (82.5 kg)    Physical Exam  Constitutional: He appears well-developed and well-nourished. No distress.  HENT:  Head: Normocephalic and atraumatic.  Eyes: EOM are normal. No scleral icterus.  Neck: No thyromegaly present.  Cardiovascular: Normal rate and regular rhythm.  Pulmonary/Chest: Effort normal and breath sounds normal.  Abdominal: Soft. Bowel sounds are normal. He exhibits no distension.  Musculoskeletal: He exhibits no edema.  Neurological: Coordination normal.  Skin: Skin is warm and dry. No pallor.  Psychiatric: He has a normal mood and affect. His behavior is normal. Judgment and thought content  normal.       Assessment & Plan:   Problem List Items Addressed This Visit      Cardiovascular and Mediastinum   Hypertension - Primary (Chronic)    Well-controlled; continue meds, continue to limit sodium      Relevant Medications   atenolol (TENORMIN) 100 MG tablet   rosuvastatin (CRESTOR) 40 MG tablet   Aortic ectasia, abdominal (HCC) (Chronic)    Due for scan in July 2022      Relevant Medications   atenolol (TENORMIN) 100 MG tablet   rosuvastatin (CRESTOR) 40 MG tablet   Abdominal aortic atherosclerosis (HCC) (Chronic)    Goal LDL is less than 70; limit saturated fats      Relevant Medications   atenolol (TENORMIN) 100 MG tablet   rosuvastatin (CRESTOR) 40 MG tablet     Other   Hx of smoking   Relevant Medications   rosuvastatin (CRESTOR) 40 MG tablet   Medication monitoring encounter   Relevant Orders   COMPLETE METABOLIC PANEL WITH GFR (Completed)   Prostate cancer screening    Check PSA      Relevant Orders   PSA (Completed)   Insomnia (Chronic)    controlled      Hyperlipidemia LDL goal <70 (Chronic)    Limit saturated fats, continues meds      Relevant Medications   atenolol (TENORMIN) 100 MG tablet   rosuvastatin (CRESTOR) 40 MG tablet   Other Relevant Orders   Lipid panel (Completed)   Gout    Offered to check uric acid level, but patient politely declined          Follow up plan: Return in about 6 months (around 08/23/2018) for follow-up visit with Dr. Sanda Klein.  An after-visit summary was printed and given to the patient at [place].  Please see the patient instructions which may contain other information and recommendations beyond what is mentioned above in the assessment and plan.  Meds ordered this encounter  Medications  . allopurinol (ZYLOPRIM) 100 MG tablet    Sig: Take 2 tablets (200 mg total) by mouth daily.    Dispense:  180 tablet    Refill:  3  . atenolol (TENORMIN) 100 MG tablet    Sig: Take 1 tablet (100 mg total) by  mouth daily.    Dispense:  90 tablet    Refill:  3  . rosuvastatin (CRESTOR) 40 MG tablet  Sig: Take 1 tablet (40 mg total) by mouth daily.    Dispense:  90 tablet    Refill:  3  . traZODone (DESYREL) 100 MG tablet    Sig: TAKE 1/2 TO 1 TABLET BY MOUTH AT BEDTIME IF NEEDED FOR SLEEP    Dispense:  90 tablet    Refill:  3    Orders Placed This Encounter  Procedures  . COMPLETE METABOLIC PANEL WITH GFR  . Lipid panel  . PSA

## 2018-02-22 NOTE — Assessment & Plan Note (Signed)
Offered to check uric acid level, but patient politely declined

## 2018-02-22 NOTE — Assessment & Plan Note (Signed)
Check PSA. ?

## 2018-02-22 NOTE — Assessment & Plan Note (Signed)
controlled 

## 2018-02-22 NOTE — Patient Instructions (Addendum)
If you ever have to take your colchicine for gout, do NOT take your Crestor (rosuvastatin) for 3 days  Try to limit saturated fats in your diet (bologna, hot dogs, barbeque, cheeseburgers, hamburgers, steak, bacon, sausage, cheese, etc.) and get more fresh fruits, vegetables, and whole grains Try to follow the DASH guidelines (DASH stands for Dietary Approaches to Stop Hypertension). Try to limit the sodium in your diet to no more than 1,500mg  of sodium per day. Certainly try to not exceed 2,000 mg per day at the very most. Do not add salt when cooking or at the table.  Check the sodium amount on labels when shopping, and choose items lower in sodium when given a choice. Avoid or limit foods that already contain a lot of sodium. Eat a diet rich in fruits and vegetables and whole grains, and try to lose weight if overweight or obese  REQUEST pneumonia vaccine record from [facility] in [place] which was done in 2018

## 2018-02-23 LAB — COMPLETE METABOLIC PANEL WITH GFR
AG RATIO: 2 (calc) (ref 1.0–2.5)
ALKALINE PHOSPHATASE (APISO): 34 U/L — AB (ref 40–115)
ALT: 31 U/L (ref 9–46)
AST: 32 U/L (ref 10–35)
Albumin: 4.3 g/dL (ref 3.6–5.1)
BILIRUBIN TOTAL: 0.6 mg/dL (ref 0.2–1.2)
BUN: 21 mg/dL (ref 7–25)
CHLORIDE: 104 mmol/L (ref 98–110)
CO2: 26 mmol/L (ref 20–32)
Calcium: 8.9 mg/dL (ref 8.6–10.3)
Creat: 0.96 mg/dL (ref 0.70–1.25)
GFR, EST AFRICAN AMERICAN: 94 mL/min/{1.73_m2} (ref >=60)
GFR, Est Non African American: 81 mL/min/{1.73_m2} (ref >=60)
GLUCOSE: 83 mg/dL (ref 65–99)
Globulin: 2.2 g/dL (calc) (ref 1.9–3.7)
POTASSIUM: 4.3 mmol/L (ref 3.5–5.3)
SODIUM: 136 mmol/L (ref 135–146)
Total Protein: 6.5 g/dL (ref 6.1–8.1)

## 2018-02-23 LAB — LIPID PANEL
CHOLESTEROL: 126 mg/dL (ref <200)
HDL: 62 mg/dL (ref >40)
LDL Cholesterol (Calc): 51 mg/dL (calc)
Non-HDL Cholesterol (Calc): 64 mg/dL (calc) (ref <130)
Total CHOL/HDL Ratio: 2 (calc) (ref <5.0)
Triglycerides: 53 mg/dL (ref <150)

## 2018-02-23 LAB — PSA: PSA: 0.8 ng/mL (ref <=4.0)

## 2018-02-25 ENCOUNTER — Telehealth: Payer: Self-pay | Admitting: Family Medicine

## 2018-02-25 NOTE — Telephone Encounter (Signed)
Copied from [place] [id number]. Topic: Quick Communication - See Telephone Encounter >> Feb 25, 2018  9:53 AM Loma Boston wrote: CRM for notification. See Telephone encounter for: 02/25/18 Katlin from [facility] calling /Dr Lada requested where patient received last shot Prevnar 13. It was actually done at [facility] before [facility] bought them out only info she has is it was Prevnar 13 on 01/08/17. She says they do not have any other papers. This is info is being givin by Chad at [facility] in [place]

## 2018-03-01 ENCOUNTER — Other Ambulatory Visit: Payer: Self-pay | Admitting: Family Medicine

## 2018-03-01 DIAGNOSIS — Z87891 Personal history of nicotine dependence: Secondary | ICD-10-CM

## 2018-03-01 DIAGNOSIS — E785 Hyperlipidemia, unspecified: Secondary | ICD-10-CM

## 2018-03-01 DIAGNOSIS — I700 Atherosclerosis of aorta: Secondary | ICD-10-CM

## 2018-04-30 ENCOUNTER — Ambulatory Visit (INDEPENDENT_AMBULATORY_CARE_PROVIDER_SITE_OTHER): Payer: PPO | Admitting: Nurse Practitioner

## 2018-04-30 ENCOUNTER — Encounter: Payer: Self-pay | Admitting: Nurse Practitioner

## 2018-04-30 VITALS — BP 118/68 | HR 71 | Temp 99.0°F | Resp 16 | Ht 69.0 in | Wt 189.5 lb

## 2018-04-30 DIAGNOSIS — J069 Acute upper respiratory infection, unspecified: Secondary | ICD-10-CM

## 2018-04-30 MED ORDER — FLUTICASONE PROPIONATE 50 MCG/ACT NA SUSP: 2.0000 | Freq: Every day | NASAL | 6 refills | Status: DC

## 2018-04-30 NOTE — Patient Instructions (Addendum)
-   Symptoms should start to improve around day 5 and get better every day from there. If you have a sudden worsening of symptoms fevers, shortness of breath, please call or mychart-let us know.   You likely have a viral upper respiratory infection (URI). Antibiotics will not reduce the number of days you are ill or prevent you from getting bacterial rhinosinusitis. A URI can take up to 14 days to resolve, but typically last between 7-11 days. Your body is so smart and strong that it will be fighting this illness off for you but it is important that you drink plenty of fluids, rest. Cover your nose/mouth when you cough or sneeze and wash your hands well and often. Here are some helpful things you can use or pick up over the counter from the pharmacy to help with your symptoms:   For Fever/Pain: Acetaminophen every 6 hours as needed (maximum of 3000mg  a day). If you are still uncomfortable you can add ibuprofen OR naproxen  For coughing: try dextromethorphan for a cough suppressant, and/or a cool mist humidifier, lozenges  For sore throat: saline gargles, honey herbal tea, lozenges, throat spray  To dry out your nose: try an antihistamine like loratadine (non-sedating) or diphenhydramine (sedating) or others To relieve a stuffy nose: try an oral decongestant  Like pseudoephedrine if you are under the age of 62 and do not have high blood pressure, neti pot To make blowing your nose easier: guaifenesin

## 2018-04-30 NOTE — Progress Notes (Signed)
Name: Victor Knight   MRN: [id number]    DOB: Oct 28, 1950   Date:04/30/2018       Progress Note  Subjective  Chief Complaint  Chief Complaint  Patient presents with  . Sinus Problem    patient presents with sinus issues since Sunday.   . Nasal Congestion    greenish nasal mucus. otc Afrin, Zinc & Vitamin C   . Cough    started out clear then went to yellow but is now green. OTC Nyquil  . Sore Throat  . Ear Fullness    HPI  Sunday noticed sore throat, runny nose, then nasal congestion worsened- went from clear to yellow to green sputum. Mild cough getting out thick green sputum. Sore throat still present but resolved some. Bilateral ear fullness.   Has tried nyquil, vitamin C, Zinc and afrin.   Patient Active Problem List   Diagnosis Date Noted  . Medicare annual wellness visit, subsequent 07/03/2017  . Tendinitis of shoulder 04/09/2017  . Bursitis of shoulder 04/09/2017  . Shoulder pain 04/09/2017  . Adhesive capsulitis of shoulder 04/09/2017  . Dupuytren contracture 04/09/2017  . Complete tear of right rotator cuff 04/20/2016  . Preventative health care 12/05/2015  . Medication monitoring encounter 11/02/2015  . Abdominal aortic atherosclerosis ([place]) 10/27/2015  . Aortic ectasia, abdominal ([place]) 10/27/2015  . Hx of smoking 10/05/2015  . History of hepatitis C 10/05/2015  . Prostate cancer screening 10/05/2015  . Disease of liver 09/29/2015  . Hypertension 10/05/2014  . Insomnia 10/05/2014  . Hyperlipidemia LDL goal <70 10/05/2014  . Gout 10/05/2014    Past Medical History:  Diagnosis Date  . Abdominal aortic atherosclerosis ([place]) 10/27/2015  . Aortic ectasia, abdominal ([place]) 10/27/2015  . Gout   . Hyperlipidemia   . Hypertension   . Insomnia     Past Surgical History:  Procedure Laterality Date  . CARPAL TUNNEL RELEASE    . rotator cuff surgery   04/20/2016   right arm; [facility]   . TRIGGER FINGER RELEASE      Social History   Tobacco Use  . Smoking  status: Former Research scientist (life sciences)  . Smokeless tobacco: Never Used  Substance Use Topics  . Alcohol use: Yes    Alcohol/week: 0.0 standard drinks     Current Outpatient Medications:  .  allopurinol (ZYLOPRIM) 100 MG tablet, Take 2 tablets (200 mg total) by mouth daily., Disp: 180 tablet, Rfl: 3 .  aspirin EC 81 MG tablet, Take 1 tablet (81 mg total) by mouth daily., Disp: , Rfl:  .  atenolol (TENORMIN) 100 MG tablet, Take 1 tablet (100 mg total) by mouth daily., Disp: 90 tablet, Rfl: 3 .  ezetimibe (ZETIA) 10 MG tablet, Take 1 tablet (10 mg total) by mouth daily., Disp: 90 tablet, Rfl: 3 .  LUMIGAN 0.01 % SOLN, instill 1 drop into right eye once daily at bedtime, Disp: , Rfl: 0 .  rosuvastatin (CRESTOR) 40 MG tablet, Take 1 tablet (40 mg total) by mouth daily., Disp: 90 tablet, Rfl: 3 .  traZODone (DESYREL) 100 MG tablet, TAKE 1/2 TO 1 TABLET BY MOUTH AT BEDTIME IF NEEDED FOR SLEEP, Disp: 90 tablet, Rfl: 3 .  colchicine 0.6 MG tablet, Two by mouth at onset of flare, followed by one pill one hour later; max of 3 pills every 3 days, Disp: , Rfl:   Allergies  Allergen Reactions  . Keflex [Cephalexin] Rash    ROS   No other specific complaints in a complete review of  systems (except as listed in HPI above).  Objective  Vitals:   04/30/18 1515  BP: 118/68  Pulse: 71  Resp: 16  Temp: 99 F (37.2 C)  TempSrc: Oral  SpO2: 94%  Weight: 189 lb 8 oz (86 kg)  Height: 5\' 9"  (1.753 m)    Body mass index is 27.98 kg/m.  Nursing Note and Vital Signs reviewed.  Physical Exam HENT:     Head: Normocephalic and atraumatic.     Right Ear: Hearing, tympanic membrane, ear canal and external ear normal.     Left Ear: Hearing, tympanic membrane, ear canal and external ear normal.     Nose: Nose normal.     Right Sinus: No maxillary sinus tenderness or frontal sinus tenderness.     Left Sinus: No maxillary sinus tenderness or frontal sinus tenderness.     Mouth/Throat:     Mouth: Mucous membranes  are moist.     Pharynx: Uvula midline. No oropharyngeal exudate or posterior oropharyngeal erythema.  Eyes:     General:        Right eye: No discharge.        Left eye: No discharge.     Conjunctiva/sclera: Conjunctivae normal.  Neck:     Musculoskeletal: Normal range of motion.  Cardiovascular:     Rate and Rhythm: Normal rate.  Pulmonary:     Effort: Pulmonary effort is normal.     Breath sounds: Normal breath sounds.  Lymphadenopathy:     Cervical: No cervical adenopathy.  Skin:    General: Skin is warm and dry.     Findings: No rash.  Neurological:     Mental Status: He is alert.  Psychiatric:        Judgment: Judgment normal.       No results found for this or any previous visit (from the past 48 hour(s)).  Assessment & Plan  1. Upper respiratory tract infection, unspecified type Discussed course of URI and OTC treatments and prevention of secondary infections. - fluticasone (FLONASE) 50 MCG/ACT nasal spray; Place 2 sprays into both nostrils daily.  Dispense: 16 g; Refill: 6

## 2018-05-19 ENCOUNTER — Other Ambulatory Visit: Payer: Self-pay | Admitting: Family Medicine

## 2018-07-05 ENCOUNTER — Ambulatory Visit: Payer: PPO

## 2018-08-23 ENCOUNTER — Ambulatory Visit: Payer: PPO

## 2018-08-23 ENCOUNTER — Ambulatory Visit: Payer: PPO | Admitting: Family Medicine

## 2018-08-26 ENCOUNTER — Ambulatory Visit (INDEPENDENT_AMBULATORY_CARE_PROVIDER_SITE_OTHER): Payer: PPO | Admitting: Nurse Practitioner

## 2018-08-26 ENCOUNTER — Other Ambulatory Visit: Payer: Self-pay

## 2018-08-26 ENCOUNTER — Encounter: Payer: Self-pay | Admitting: Nurse Practitioner

## 2018-08-26 VITALS — BP 133/82 | HR 60 | Ht 70.0 in | Wt 185.0 lb

## 2018-08-26 DIAGNOSIS — F5101 Primary insomnia: Secondary | ICD-10-CM

## 2018-08-26 DIAGNOSIS — I10 Essential (primary) hypertension: Secondary | ICD-10-CM

## 2018-08-26 DIAGNOSIS — M1000 Idiopathic gout, unspecified site: Secondary | ICD-10-CM

## 2018-08-26 DIAGNOSIS — E785 Hyperlipidemia, unspecified: Secondary | ICD-10-CM

## 2018-08-26 DIAGNOSIS — I700 Atherosclerosis of aorta: Secondary | ICD-10-CM

## 2018-08-26 MED ORDER — EZETIMIBE 10 MG PO TABS: 10.0000 mg | ORAL_TABLET | Freq: Every day | ORAL | 1 refills | Status: DC

## 2018-08-26 MED ORDER — ALLOPURINOL 100 MG PO TABS: 200.0000 mg | ORAL_TABLET | Freq: Every day | ORAL | 1 refills | Status: DC

## 2018-08-26 NOTE — Progress Notes (Signed)
Virtual Visit via Video Note  I connected with Victor Knight on 08/26/18 at 10:20 AM EDT by a video enabled telemedicine application and verified that I am speaking with the correct person using two identifiers.   Staff discussed the limitations of evaluation and management by telemedicine and the availability of in person appointments. The patient expressed understanding and agreed to proceed.  Patient location: home  My location: work office Other people present:  none HPI  Hypertension rx atenolol 100mg  daily no missed doses  BP Readings from Last 3 Encounters:  08/26/18 133/82  04/30/18 118/68  02/22/18 128/78    Hyperlipidemia rx Crestor 40mg  and zetia 10mg  takes daily with no missed doses.  Eats some vegetables at least twice daily  Lab Results  Component Value Date   CHOL 126 02/22/2018   HDL 62 02/22/2018   LDLCALC 51 02/22/2018   TRIG 53 02/22/2018   CHOLHDL 2.0 02/22/2018    Gout  rx allopurinol 100mg  daily and colchicine 0.6mg  PRN  States last gout flare was over 4 years ago, states cut way back red meat and alcohol and his much improved.   Insomnia rx trazodone takes it nightly and it works well for him, gets about 7 hours of sleep a night and feels refreshed in the morning.  PHQ2/9: Depression screen [facility] 2/9 08/26/2018 04/30/2018 02/22/2018 08/23/2017 07/03/2017  Decreased Interest 0 0 0 0 0  Down, Depressed, Hopeless 0 0 0 0 0  PHQ - 2 Score 0 0 0 0 0  Altered sleeping 0 - 0 - -  Tired, decreased energy 0 - 0 - -  Change in appetite 0 - 0 - -  Feeling bad or failure about yourself  0 - 0 - -  Trouble concentrating 0 - 0 - -  Moving slowly or fidgety/restless 0 - 0 - -  Suicidal thoughts 0 - 0 - -  PHQ-9 Score 0 - 0 - -  Difficult doing work/chores Not difficult at all - Not difficult at all - -     PHQ reviewed. Negative  Patient Active Problem List   Diagnosis Date Noted  . Medicare annual wellness visit, subsequent 07/03/2017  . Tendinitis of  shoulder 04/09/2017  . Bursitis of shoulder 04/09/2017  . Shoulder pain 04/09/2017  . Adhesive capsulitis of shoulder 04/09/2017  . Dupuytren contracture 04/09/2017  . Complete tear of right rotator cuff 04/20/2016  . Preventative health care 12/05/2015  . Medication monitoring encounter 11/02/2015  . Abdominal aortic atherosclerosis ([place]) 10/27/2015  . Aortic ectasia, abdominal ([place]) 10/27/2015  . Hx of smoking 10/05/2015  . History of hepatitis C 10/05/2015  . Prostate cancer screening 10/05/2015  . Disease of liver 09/29/2015  . Hypertension 10/05/2014  . Insomnia 10/05/2014  . Hyperlipidemia LDL goal <70 10/05/2014  . Gout 10/05/2014    Past Medical History:  Diagnosis Date  . Abdominal aortic atherosclerosis ([place]) 10/27/2015  . Aortic ectasia, abdominal ([place]) 10/27/2015  . Gout   . Hyperlipidemia   . Hypertension   . Insomnia     Past Surgical History:  Procedure Laterality Date  . CARPAL TUNNEL RELEASE    . rotator cuff surgery   04/20/2016   right arm; [facility]   . TRIGGER FINGER RELEASE      Social History   Tobacco Use  . Smoking status: Former Research scientist (life sciences)  . Smokeless tobacco: Never Used  Substance Use Topics  . Alcohol use: Yes    Alcohol/week: 0.0 standard drinks  Current Outpatient Medications:  .  allopurinol (ZYLOPRIM) 100 MG tablet, Take 2 tablets (200 mg total) by mouth daily., Disp: 180 tablet, Rfl: 1 .  aspirin EC 81 MG tablet, Take 1 tablet (81 mg total) by mouth daily., Disp: , Rfl:  .  atenolol (TENORMIN) 100 MG tablet, Take 1 tablet (100 mg total) by mouth daily., Disp: 90 tablet, Rfl: 3 .  colchicine 0.6 MG tablet, Two by mouth at onset of flare, followed by one pill one hour later; max of 3 pills every 3 days, Disp: , Rfl:  .  ezetimibe (ZETIA) 10 MG tablet, Take 1 tablet (10 mg total) by mouth daily., Disp: 90 tablet, Rfl: 1 .  fluticasone (FLONASE) 50 MCG/ACT nasal spray, Place 2 sprays into both nostrils daily., Disp: 16 g, Rfl: 6 .  LUMIGAN  0.01 % SOLN, instill 1 drop into right eye once daily at bedtime, Disp: , Rfl: 0 .  rosuvastatin (CRESTOR) 40 MG tablet, Take 1 tablet (40 mg total) by mouth daily., Disp: 90 tablet, Rfl: 3 .  traZODone (DESYREL) 100 MG tablet, TAKE 1/2 TO 1 TABLET BY MOUTH AT BEDTIME IF NEEDED FOR SLEEP, Disp: 90 tablet, Rfl: 3  Allergies  Allergen Reactions  . Keflex [Cephalexin] Rash    Review of Systems  Constitutional: Negative for chills, fever and malaise/fatigue.  Respiratory: Negative for cough and shortness of breath.   Cardiovascular: Negative for chest pain, palpitations and leg swelling.  Gastrointestinal: Negative for abdominal pain and nausea.  Musculoskeletal: Negative for joint pain and myalgias.  Skin: Negative for rash.  Neurological: Negative for dizziness and headaches.  Psychiatric/Behavioral: Negative for depression. The patient is not nervous/anxious and does not have insomnia.      No other specific complaints in a complete review of systems (except as listed in HPI above).  Objective  Vitals:   08/26/18 1020  BP: 133/82  Pulse: 60  Weight: 185 lb (83.9 kg)  Height: 5\' 10"  (1.778 m)    Body mass index is 26.54 kg/m.  Nursing Note and Vital Signs reviewed.  Physical Exam  Constitutional: Patient appears well-developed and well-nourished. No distress.  HENT: Head: Normocephalic and atraumatic. Cardiovascular: Normal rate  Pulmonary/Chest: Effort normal  Neurological: alert and oriented, speech normal.  Skin: No rash noted. No erythema.  Psychiatric: Patient has a normal mood and affect. behavior is normal. Judgment and thought content normal.    Assessment & Plan 1. Essential hypertension Stable, continue atenolol   2. Abdominal aortic atherosclerosis ([place]) Well controlled BP and lipids   3. Hyperlipidemia LDL goal <70 Continue statin and zetia and diet, lipids looked great last time, recheck at followup - ezetimibe (ZETIA) 10 MG tablet; Take 1 tablet  (10 mg total) by mouth daily.  Dispense: 90 tablet; Refill: 1  4. Idiopathic gout, unspecified chronicity, unspecified site stalbe - allopurinol (ZYLOPRIM) 100 MG tablet; Take 2 tablets (200 mg total) by mouth daily.  Dispense: 180 tablet; Refill: 1  5. Primary insomnia Stable, continue trazadone      Follow Up Instructions:   6 month follow-up CPE  I discussed the assessment and treatment plan with the patient. The patient was provided an opportunity to ask questions and all were answered. The patient agreed with the plan and demonstrated an understanding of the instructions.   The patient was advised to call back or seek an in-person evaluation if the symptoms worsen or if the condition fails to improve as anticipated.  I provided 12 minutes of non-face-to-face  time during this encounter.   Fredderick Severance, NP

## 2018-10-02 DIAGNOSIS — H401131 Primary open-angle glaucoma, bilateral, mild stage: Secondary | ICD-10-CM | POA: Diagnosis not present

## 2018-10-19 ENCOUNTER — Other Ambulatory Visit: Payer: Self-pay | Admitting: Family Medicine

## 2018-10-19 DIAGNOSIS — E785 Hyperlipidemia, unspecified: Secondary | ICD-10-CM

## 2019-01-23 ENCOUNTER — Ambulatory Visit (INDEPENDENT_AMBULATORY_CARE_PROVIDER_SITE_OTHER): Payer: PPO

## 2019-01-23 ENCOUNTER — Other Ambulatory Visit: Payer: Self-pay

## 2019-01-23 DIAGNOSIS — Z23 Encounter for immunization: Secondary | ICD-10-CM | POA: Diagnosis not present

## 2019-02-17 ENCOUNTER — Other Ambulatory Visit: Payer: Self-pay

## 2019-02-17 DIAGNOSIS — Z87891 Personal history of nicotine dependence: Secondary | ICD-10-CM

## 2019-02-17 DIAGNOSIS — I700 Atherosclerosis of aorta: Secondary | ICD-10-CM

## 2019-02-17 DIAGNOSIS — E785 Hyperlipidemia, unspecified: Secondary | ICD-10-CM

## 2019-02-17 MED ORDER — ROSUVASTATIN CALCIUM 40 MG PO TABS: 40.0000 mg | ORAL_TABLET | Freq: Every day | ORAL | 3 refills | Status: DC

## 2019-02-17 MED ORDER — ATENOLOL 100 MG PO TABS: 100.0000 mg | ORAL_TABLET | Freq: Every day | ORAL | 3 refills | Status: DC

## 2019-02-26 ENCOUNTER — Other Ambulatory Visit: Payer: Self-pay

## 2019-02-26 ENCOUNTER — Ambulatory Visit (INDEPENDENT_AMBULATORY_CARE_PROVIDER_SITE_OTHER): Payer: PPO | Admitting: Family Medicine

## 2019-02-26 ENCOUNTER — Encounter: Payer: Self-pay | Admitting: Family Medicine

## 2019-02-26 VITALS — BP 122/80 | HR 64 | Temp 97.8°F | Resp 14 | Ht 70.0 in | Wt 192.7 lb

## 2019-02-26 DIAGNOSIS — Z5181 Encounter for therapeutic drug level monitoring: Secondary | ICD-10-CM | POA: Diagnosis not present

## 2019-02-26 DIAGNOSIS — I10 Essential (primary) hypertension: Secondary | ICD-10-CM

## 2019-02-26 DIAGNOSIS — Z8619 Personal history of other infectious and parasitic diseases: Secondary | ICD-10-CM

## 2019-02-26 DIAGNOSIS — F5101 Primary insomnia: Secondary | ICD-10-CM | POA: Diagnosis not present

## 2019-02-26 DIAGNOSIS — I700 Atherosclerosis of aorta: Secondary | ICD-10-CM

## 2019-02-26 DIAGNOSIS — E785 Hyperlipidemia, unspecified: Secondary | ICD-10-CM

## 2019-02-26 DIAGNOSIS — M1000 Idiopathic gout, unspecified site: Secondary | ICD-10-CM

## 2019-02-26 LAB — COMPLETE METABOLIC PANEL WITH GFR
AG Ratio: 1.9 (calc) (ref 1.0–2.5)
ALT: 40 U/L (ref 9–46)
AST: 34 U/L (ref 10–35)
Albumin: 4.4 g/dL (ref 3.6–5.1)
Alkaline phosphatase (APISO): 35 U/L (ref 35–144)
BUN: 20 mg/dL (ref 7–25)
CO2: 28 mmol/L (ref 20–32)
Calcium: 9.5 mg/dL (ref 8.6–10.3)
Chloride: 107 mmol/L (ref 98–110)
Creat: 1.04 mg/dL (ref 0.70–1.25)
GFR, Est African American: 85 mL/min/{1.73_m2} (ref >=60)
GFR, Est Non African American: 73 mL/min/{1.73_m2} (ref >=60)
Globulin: 2.3 g/dL (calc) (ref 1.9–3.7)
Glucose, Bld: 100 mg/dL — ABNORMAL HIGH (ref 65–99)
Potassium: 4.5 mmol/L (ref 3.5–5.3)
Sodium: 138 mmol/L (ref 135–146)
Total Bilirubin: 0.5 mg/dL (ref 0.2–1.2)
Total Protein: 6.7 g/dL (ref 6.1–8.1)

## 2019-02-26 LAB — LIPID PANEL
Cholesterol: 134 mg/dL (ref <200)
HDL: 62 mg/dL (ref >=40)
LDL Cholesterol (Calc): 59 mg/dL (calc)
Non-HDL Cholesterol (Calc): 72 mg/dL (calc) (ref <130)
Total CHOL/HDL Ratio: 2.2 (calc) (ref <5.0)
Triglycerides: 53 mg/dL (ref <150)

## 2019-02-26 LAB — URIC ACID: Uric Acid, Serum: 6.2 mg/dL (ref 4.0–8.0)

## 2019-02-26 MED ORDER — ALLOPURINOL 100 MG PO TABS: 100.0000 mg | ORAL_TABLET | Freq: Every day | ORAL | 3 refills | Status: DC

## 2019-02-26 MED ORDER — TRAZODONE HCL 100 MG PO TABS: ORAL_TABLET | ORAL | 3 refills | Status: DC

## 2019-02-26 MED ORDER — EZETIMIBE 10 MG PO TABS: 10.0000 mg | ORAL_TABLET | Freq: Every day | ORAL | 1 refills | Status: DC

## 2019-02-26 NOTE — Progress Notes (Signed)
Name: Victor Knight   MRN: [id number]    DOB: 1950-12-24   Date:02/26/2019       Progress Note  Chief Complaint  Patient presents with  . Hyperlipidemia  . Hypertension     Subjective:   Victor Knight is a 68 y.o. male, presents to clinic for routine follow up on the conditions listed above.  Will do PSSV 23 today UTD on other vaccines and screenings  Hyperlipidemia:  Current Medication Regimen:  crestor 40  Mg  And zetia 10 mg Last Lipids: Lab Results  Component Value Date   CHOL 126 02/22/2018   HDL 62 02/22/2018   LDLCALC 51 02/22/2018   TRIG 53 02/22/2018   CHOLHDL 2.0 02/22/2018   - Current Diet:  Doesn't eat red meat or pork, eating healthier, no fast foods, all cooking at home, eating more grains and vegetables - Denies: Chest pain, shortness of breath, myalgias. - Documented aortic atherosclerosis? Yes - Risk factors for atherosclerosis: hypercholesterolemia and hypertension  Patient denies any exertional symptoms, palpitations, claudication.  Family hx of aFib in oldest brother, another brother has Psychologist, forensic, father passed away from [place] in his 31's   Hypertension:  Pt diagnosed with HTN 15+ ago Currently managed on atenolol Pt reports good med compliance and denies any SE.  No lightheadedness, hypotension, syncope. Blood pressure today is well controlled. BP Readings from Last 3 Encounters:  02/26/19 122/80  08/26/18 133/82  04/30/18 118/68   Pt denies CP, SOB, exertional sx, LE edema, palpitation, Ha's, visual disturbances Dietary efforts for BP? In addition to diet efforts above, low salt   Gout: He changed his diet drastically 4 years ago and he hasn't had a flare in a long time, no ETOH, cut back meats  Insomnia:  Trazodone 100 mg at bedtime for sleep, is still effective, flees like he's getting good sleep and enough of it  Patient Active Problem List   Diagnosis Date Noted  . Medicare annual wellness visit, subsequent 07/03/2017  .  Tendinitis of shoulder 04/09/2017  . Bursitis of shoulder 04/09/2017  . Shoulder pain 04/09/2017  . Adhesive capsulitis of shoulder 04/09/2017  . Dupuytren contracture 04/09/2017  . Complete tear of right rotator cuff 04/20/2016  . Preventative health care 12/05/2015  . Medication monitoring encounter 11/02/2015  . Abdominal aortic atherosclerosis ([place]) 10/27/2015  . Aortic ectasia, abdominal ([place]) 10/27/2015  . Hx of smoking 10/05/2015  . History of hepatitis C 10/05/2015  . Prostate cancer screening 10/05/2015  . Disease of liver 09/29/2015  . Hypertension 10/05/2014  . Insomnia 10/05/2014  . Hyperlipidemia LDL goal <70 10/05/2014  . Gout 10/05/2014    Past Surgical History:  Procedure Laterality Date  . CARPAL TUNNEL RELEASE    . rotator cuff surgery   04/20/2016   right arm; [facility]   . TRIGGER FINGER RELEASE      Family History  Problem Relation Age of Onset  . Hypertension Father   . Heart disease Father   . Hypertension Brother   . Hypertension Sister   . Supraventricular tachycardia Daughter   . Heart disease Daughter   . Hypertension Brother   . Hypertension Brother   . Hypertension Brother   . Heart disease Brother     Social History   Socioeconomic History  . Marital status: Married    Spouse name: [name]  . Number of children: 1  . Years of education: Not on file  . Highest education level: High school graduate  Occupational  History  . Occupation: Retired  Scientific laboratory technician  . Financial resource strain: Not hard at all  . Food insecurity    Worry: Never true    Inability: Never true  . Transportation needs    Medical: No    Non-medical: No  Tobacco Use  . Smoking status: Former Research scientist (life sciences)  . Smokeless tobacco: Never Used  Substance and Sexual Activity  . Alcohol use: Yes    Alcohol/week: 0.0 standard drinks  . Drug use: No  . Sexual activity: Yes    Partners: Female  Lifestyle  . Physical activity    Days per week: 4 days    Minutes per  session: 60 min  . Stress: Not at all  Relationships  . Social connections    Talks on phone: More than three times a week    Gets together: Three times a week    Attends religious service: More than 4 times per year    Active member of club or organization: Yes    Attends meetings of clubs or organizations: More than 4 times per year    Relationship status: Married  . Intimate partner violence    Fear of current or ex partner: No    Emotionally abused: No    Physically abused: No    Forced sexual activity: No  Other Topics Concern  . Not on file  Social History Narrative  . Not on file     Current Outpatient Medications:  .  allopurinol (ZYLOPRIM) 100 MG tablet, Take 2 tablets (200 mg total) by mouth daily., Disp: 180 tablet, Rfl: 1 .  aspirin EC 81 MG tablet, Take 1 tablet (81 mg total) by mouth daily., Disp: , Rfl:  .  atenolol (TENORMIN) 100 MG tablet, Take 1 tablet (100 mg total) by mouth daily., Disp: 90 tablet, Rfl: 3 .  colchicine 0.6 MG tablet, Two by mouth at onset of flare, followed by one pill one hour later; max of 3 pills every 3 days, Disp: , Rfl:  .  ezetimibe (ZETIA) 10 MG tablet, Take 1 tablet (10 mg total) by mouth daily., Disp: 90 tablet, Rfl: 1 .  LUMIGAN 0.01 % SOLN, instill 1 drop into right eye once daily at bedtime, Disp: , Rfl: 0 .  rosuvastatin (CRESTOR) 40 MG tablet, Take 1 tablet (40 mg total) by mouth daily., Disp: 90 tablet, Rfl: 3 .  traZODone (DESYREL) 100 MG tablet, TAKE 1/2 TO 1 TABLET BY MOUTH AT BEDTIME IF NEEDED FOR SLEEP, Disp: 90 tablet, Rfl: 3 .  fluticasone (FLONASE) 50 MCG/ACT nasal spray, Place 2 sprays into both nostrils daily. (Patient not taking: Reported on 02/26/2019), Disp: 16 g, Rfl: 6  Allergies  Allergen Reactions  . Keflex [Cephalexin] Rash    I personally reviewed active problem list, medication list, allergies, family history, social history, health maintenance, notes from last encounter, lab results, imaging with the  patient/caregiver today.  Review of Systems  Constitutional: Negative.   HENT: Negative.   Eyes: Negative.   Respiratory: Negative.   Cardiovascular: Negative.   Gastrointestinal: Negative.   Endocrine: Negative.   Genitourinary: Negative.   Musculoskeletal: Negative.   Skin: Negative.   Allergic/Immunologic: Negative.   Neurological: Negative.   Hematological: Negative.   Psychiatric/Behavioral: Negative.   All other systems reviewed and are negative.    Objective:    Vitals:   02/26/19 1001  BP: 122/80  Pulse: 64  Resp: 14  Temp: 97.8 F (36.6 C)  SpO2: 99%  Weight: 192  lb 11.2 oz (87.4 kg)  Height: 5\' 10"  (1.778 m)    Body mass index is 27.65 kg/m.  Physical Exam Vitals signs and nursing note reviewed.  Constitutional:      General: He is not in acute distress.    Appearance: Normal appearance. He is well-developed. He is not ill-appearing, toxic-appearing or diaphoretic.     Interventions: Face mask in place.  HENT:     Head: Normocephalic and atraumatic.     Jaw: No trismus.     Right Ear: External ear normal.     Left Ear: External ear normal.  Eyes:     General: Lids are normal. No scleral icterus.    Conjunctiva/sclera: Conjunctivae normal.     Pupils: Pupils are equal, round, and reactive to light.  Neck:     Musculoskeletal: Normal range of motion and neck supple.     Trachea: Trachea and phonation normal. No tracheal deviation.  Cardiovascular:     Rate and Rhythm: Normal rate and regular rhythm.     Pulses: Normal pulses.          Radial pulses are 2+ on the right side and 2+ on the left side.       Posterior tibial pulses are 2+ on the right side and 2+ on the left side.     Heart sounds: Normal heart sounds. No murmur. No friction rub. No gallop.   Pulmonary:     Effort: Pulmonary effort is normal. No respiratory distress.     Breath sounds: Normal breath sounds. No stridor. No wheezing, rhonchi or rales.  Abdominal:     General: Bowel  sounds are normal. There is no distension.     Palpations: Abdomen is soft.     Tenderness: There is no abdominal tenderness. There is no guarding or rebound.  Musculoskeletal: Normal range of motion.     Right lower leg: No edema.     Left lower leg: No edema.  Skin:    General: Skin is warm and dry.     Capillary Refill: Capillary refill takes less than 2 seconds.     Coloration: Skin is not jaundiced or pale.     Findings: No rash.     Nails: There is no clubbing.   Neurological:     Mental Status: He is alert.     Cranial Nerves: No dysarthria or facial asymmetry.     Motor: No tremor or abnormal muscle tone.     Coordination: Coordination normal.     Gait: Gait normal.  Psychiatric:        Mood and Affect: Mood normal.        Speech: Speech normal.        Behavior: Behavior normal. Behavior is cooperative.       PHQ2/9: Depression screen [facility] 2/9 02/26/2019 08/26/2018 04/30/2018 02/22/2018 08/23/2017  Decreased Interest 0 0 0 0 0  Down, Depressed, Hopeless 0 0 0 0 0  PHQ - 2 Score 0 0 0 0 0  Altered sleeping 0 0 - 0 -  Tired, decreased energy 0 0 - 0 -  Change in appetite 0 0 - 0 -  Feeling bad or failure about yourself  0 0 - 0 -  Trouble concentrating 0 0 - 0 -  Moving slowly or fidgety/restless 0 0 - 0 -  Suicidal thoughts 0 0 - 0 -  PHQ-9 Score 0 0 - 0 -  Difficult doing work/chores Not difficult at all Not difficult at  all - Not difficult at all -    phq 9 is positive Reviewed today  Fall Risk: Fall Risk  02/26/2019 08/26/2018 04/30/2018 02/22/2018 08/23/2017  Falls in the past year? 0 0 0 0 No  Number falls in past yr: 0 - 0 0 -  Injury with Fall? 0 - 0 - -      Functional Status Survey: Is the patient deaf or have difficulty hearing?: No Does the patient have difficulty seeing, even when wearing glasses/contacts?: No Does the patient have difficulty concentrating, remembering, or making decisions?: No Does the patient have difficulty walking or climbing  stairs?: No Does the patient have difficulty dressing or bathing?: No Does the patient have difficulty doing errands alone such as visiting a doctor's office or shopping?: No    Assessment & Plan:     ICD-10-CM   1. Hyperlipidemia LDL goal <70  E78.5 ezetimibe (ZETIA) 10 MG tablet    CMP w GFR    Lipid Panel   Has been well controlled with his current medicines, compliant and no concerning side effects, no myalgias, recheck labs  2. Essential hypertension  I10 CMP w GFR   Well-controlled with current medications, no concerning symptoms or side effects  3. Abdominal aortic atherosclerosis (HCC)  I70.0 ezetimibe (ZETIA) 10 MG tablet    CMP w GFR    Lipid Panel   See above -atherosclerosis found on screening abdominal ultrasound for AAA, no claudication or PAD symptoms  4. Idiopathic gout, unspecified chronicity, unspecified site  M10.00 allopurinol (ZYLOPRIM) 100 MG tablet    CMP w GFR    Uric acid   Clinically well controlled he has decreased dose, recheck uric acid and renal function  5. Primary insomnia  F51.01 traZODone (DESYREL) 100 MG tablet   Stable, well controlled with trazodone  6. History of hepatitis C  Z86.19 CMP w GFR   Patient mentions past hep C infection wants to check his liver function which we are already checking with other labs  7. Medication monitoring encounter  Z51.81 CMP w GFR    Lipid Panel    Uric acid      Return in about 6 months (around 08/26/2019) for Routine follow-up (virtual) would want him to do labs.   Delsa Grana, PA-C 02/26/19 10:23 AM

## 2019-02-28 ENCOUNTER — Other Ambulatory Visit: Payer: Self-pay

## 2019-02-28 ENCOUNTER — Ambulatory Visit (INDEPENDENT_AMBULATORY_CARE_PROVIDER_SITE_OTHER): Payer: PPO

## 2019-02-28 DIAGNOSIS — Z23 Encounter for immunization: Secondary | ICD-10-CM

## 2019-03-07 ENCOUNTER — Other Ambulatory Visit: Payer: Self-pay

## 2019-03-07 ENCOUNTER — Ambulatory Visit (INDEPENDENT_AMBULATORY_CARE_PROVIDER_SITE_OTHER): Payer: PPO

## 2019-03-07 VITALS — BP 122/87 | Ht 70.0 in | Wt 190.0 lb

## 2019-03-07 DIAGNOSIS — Z0000 Encounter for general adult medical examination without abnormal findings: Secondary | ICD-10-CM | POA: Diagnosis not present

## 2019-03-07 DIAGNOSIS — Z125 Encounter for screening for malignant neoplasm of prostate: Secondary | ICD-10-CM

## 2019-03-07 NOTE — Progress Notes (Signed)
Subjective:   Victor Knight is a 68 y.o. male who presents for Medicare Annual/Subsequent preventive examination.   Virtual Visit via Telephone Note  I connected with Victor Knight on 03/07/19 at 11:20 AM EST by telephone and verified that I am speaking with the correct person using two identifiers.  Medicare Annual Wellness visit completed telephonically due to Covid-19 pandemic.   Location: Patient: home Provider: office   I discussed the limitations, risks, security and privacy concerns of performing an evaluation and management service by telephone and the availability of in person appointments. The patient expressed understanding and agreed to proceed.  Some vital signs may be absent or patient reported.   Clemetine Marker, LPN    Review of Systems:   Cardiac Risk Factors include: advanced age (>31men, >68 women);hypertension;male gender;dyslipidemia     Objective:    Vitals: BP 122/87   Ht 5\' 10"  (1.778 m)   Wt 190 lb (86.2 kg)   BMI 27.26 kg/m   Body mass index is 27.26 kg/m.  Advanced Directives 03/07/2019 02/12/2017 08/11/2016 07/25/2016 05/12/2016 01/31/2016 10/05/2015  Does Patient Have a Medical Advance Directive? Yes Yes Yes Yes Yes Yes Yes  Type of Paramedic of [place];Living will - - Living will [place];Living will Living will Living will  Copy of [place] in Chart? Yes - validated most recent copy scanned in chart (See row information) - - - - No - copy requested No - copy requested    Tobacco Social History   Tobacco Use  Smoking Status Former Smoker  Smokeless Tobacco Never Used     Counseling given: Not Answered   Clinical Intake:  Pre-visit preparation completed: Yes  Pain : No/denies pain     BMI - recorded: 27.26 Nutritional Status: BMI 25 -29 Overweight Nutritional Risks: None Diabetes: No  How often do you need to have someone help you when you read  instructions, pamphlets, or other written materials from your doctor or pharmacy?: 1 - Never  Interpreter Needed?: No  Information entered by :: Clemetine Marker LPN  Past Medical History:  Diagnosis Date  . Abdominal aortic atherosclerosis ([place]) 10/27/2015  . Aortic ectasia, abdominal ([place]) 10/27/2015  . Gout   . Hyperlipidemia   . Hypertension   . Insomnia    Past Surgical History:  Procedure Laterality Date  . CARPAL TUNNEL RELEASE    . rotator cuff surgery   04/20/2016   right arm; [facility]   . TRIGGER FINGER RELEASE     Family History  Problem Relation Age of Onset  . Hypertension Father   . Heart disease Father   . Hypertension Brother   . Hypertension Sister   . Supraventricular tachycardia Daughter   . Heart disease Daughter   . Hypertension Brother   . Hypertension Brother   . Hypertension Brother   . Heart disease Brother    Social History   Socioeconomic History  . Marital status: Married    Spouse name: [name]  . Number of children: 1  . Years of education: Not on file  . Highest education level: High school graduate  Occupational History  . Occupation: Retired  Scientific laboratory technician  . Financial resource strain: Not hard at all  . Food insecurity    Worry: Never true    Inability: Never true  . Transportation needs    Medical: No    Non-medical: No  Tobacco Use  . Smoking status: Former Research scientist (life sciences)  .  Smokeless tobacco: Never Used  Substance and Sexual Activity  . Alcohol use: Yes    Alcohol/week: 1.0 standard drinks    Types: 1 Glasses of wine per week  . Drug use: No  . Sexual activity: Yes    Partners: Female  Lifestyle  . Physical activity    Days per week: 0 days    Minutes per session: 0 min  . Stress: Not at all  Relationships  . Social connections    Talks on phone: More than three times a week    Gets together: Three times a week    Attends religious service: More than 4 times per year    Active member of club or organization: Yes    Attends  meetings of clubs or organizations: More than 4 times per year    Relationship status: Married  Other Topics Concern  . Not on file  Social History Narrative  . Not on file    Outpatient Encounter Medications as of 03/07/2019  Medication Sig  . allopurinol (ZYLOPRIM) 100 MG tablet Take 1 tablet (100 mg total) by mouth daily.  Marland Kitchen aspirin EC 81 MG tablet Take 1 tablet (81 mg total) by mouth daily.  Marland Kitchen atenolol (TENORMIN) 100 MG tablet Take 1 tablet (100 mg total) by mouth daily.  . colchicine 0.6 MG tablet Two by mouth at onset of flare, followed by one pill one hour later; max of 3 pills every 3 days  . ezetimibe (ZETIA) 10 MG tablet Take 1 tablet (10 mg total) by mouth daily.  Marland Kitchen LUMIGAN 0.01 % SOLN instill 1 drop into right eye once daily at bedtime  . Multiple Vitamin (MULTIVITAMIN) tablet Take 1 tablet by mouth daily.  . rosuvastatin (CRESTOR) 40 MG tablet Take 1 tablet (40 mg total) by mouth daily.  . traZODone (DESYREL) 100 MG tablet TAKE 1/2 TO 1 TABLET BY MOUTH AT BEDTIME IF NEEDED FOR SLEEP   No facility-administered encounter medications on file as of 03/07/2019.     Activities of Daily Living In your present state of health, do you have any difficulty performing the following activities: 03/07/2019 02/26/2019  Hearing? N N  Comment declines hearing aids -  Vision? N N  Difficulty concentrating or making decisions? N N  Walking or climbing stairs? N N  Dressing or bathing? N N  Doing errands, shopping? N N  Preparing Food and eating ? N -  Using the Toilet? N -  In the past six months, have you accidently leaked urine? N -  Do you have problems with loss of bowel control? N -  Managing your Medications? N -  Managing your Finances? N -  Housekeeping or managing your Housekeeping? N -  Some recent data might be hidden    Patient Care Team: Delsa Grana, PA-C as PCP - General (Family Medicine) Ralene Bathe, MD (Dermatology) Estill Cotta, MD (Ophthalmology)    Assessment:   This is a routine wellness examination for Victor Knight.  Exercise Activities and Dietary recommendations Current Exercise Habits: The patient does not participate in regular exercise at present, Exercise limited by: orthopedic condition(s)  Goals    . DIET - INCREASE WATER INTAKE (pt-stated)       Fall Risk Fall Risk  03/07/2019 02/26/2019 08/26/2018 04/30/2018 02/22/2018  Falls in the past year? 0 0 0 0 0  Number falls in past yr: 0 0 - 0 0  Injury with Fall? 0 0 - 0 -  Follow up Falls prevention discussed - - - -  FALL RISK PREVENTION PERTAINING TO THE HOME:  Any stairs in or around the home? Yes  If so, do they handrails? Yes   Home free of loose throw rugs in walkways, pet beds, electrical cords, etc? Yes  Adequate lighting in your home to reduce risk of falls? Yes   ASSISTIVE DEVICES UTILIZED TO PREVENT FALLS:  Life alert? No  Use of a cane, walker or w/c? No  Grab bars in the bathroom? No  Shower chair or bench in shower? Yes  Elevated toilet seat or a handicapped toilet? Yes   DME ORDERS:  DME order needed?  No   TIMED UP AND GO:  Was the test performed? No . Telephonic visit.  Education: Fall risk prevention has been discussed.  Intervention(s) required? No   Depression Screen PHQ 2/9 Scores 03/07/2019 02/26/2019 08/26/2018 04/30/2018  PHQ - 2 Score 0 0 0 0  PHQ- 9 Score - 0 0 -    Cognitive Function - 6CIT deferred for 2020 AWV. Pt has no memory issues.     6CIT Screen 07/03/2017  What Year? 0 points  What month? 0 points  What time? 0 points  Count back from 20 0 points  Months in reverse 0 points  Repeat phrase 0 points  Total Score 0    Immunization History  Administered Date(s) Administered  . Fluad Quad(high Dose 65+) 01/23/2019  . Influenza, High Dose Seasonal PF 01/08/2017, 02/02/2018  . Influenza-Unspecified 12/17/2015  . Pneumococcal Conjugate-13 10/05/2015, 01/08/2017  . Pneumococcal Polysaccharide-23 02/28/2019  . Tdap  09/29/2015    Qualifies for Shingles Vaccine? Yes  . Due for Shingrix. Education has been provided regarding the importance of this vaccine. Pt has been advised to call insurance company to determine out of pocket expense. Advised may also receive vaccine at local pharmacy or Health Dept. Verbalized acceptance and understanding.  Tdap:  Up to date  Flu Vaccine: Up to date  Pneumococcal Vaccine: Up to date   Screening Tests Health Maintenance  Topic Date Due  . COLONOSCOPY  04/18/2023  . TETANUS/TDAP  09/28/2025  . INFLUENZA VACCINE  Completed  . Hepatitis C Screening  Completed  . PNA vac Low Risk Adult  Completed   Cancer Screenings:  Colorectal Screening: Completed 2015. Repeat every 10 years;   Lung Cancer Screening: (Low Dose CT Chest recommended if Age 37-80 years, 30 pack-year currently smoking OR have quit w/in 15years.) does not qualify.    Additional Screening:  Hepatitis C Screening: does qualify; Completed 04/25/17  Vision Screening: Recommended annual ophthalmology exams for early detection of glaucoma and other disorders of the eye. Is the patient up to date with their annual eye exam?  Yes  Who is the provider or what is the name of the office in which the pt attends annual eye exams? [place] Screening: Recommended annual dental exams for proper oral hygiene  Community Resource Referral:  CRR required this visit?  No       Plan:    I have personally reviewed and addressed the Medicare Annual Wellness questionnaire and have noted the following in the patient's chart:  A. Medical and social history B. Use of alcohol, tobacco or illicit drugs  C. Current medications and supplements D. Functional ability and status E.  Nutritional status F.  Physical activity G. Advance directives H. List of other physicians I.  Hospitalizations, surgeries, and ER visits in previous 12 months J.  [place] such as hearing and vision if  needed, cognitive and depression L. Referrals and appointments   In addition, I have reviewed and discussed with patient certain preventive protocols, quality metrics, and best practice recommendations. A written personalized care plan for preventive services as well as general preventive health recommendations were provided to patient.   Signed,  Clemetine Marker, LPN Nurse Health Advisor   Nurse Notes: pt doing well and appreciative of visit today.

## 2019-03-07 NOTE — Patient Instructions (Signed)
Victor Knight , Thank you for taking time to come for your Medicare Wellness Visit. I appreciate your ongoing commitment to your health goals. Please review the following plan we discussed and let me know if I can assist you in the future.   Screening recommendations/referrals: Colonoscopy: done 2015. Repeat in 2025. Recommended yearly ophthalmology/optometry visit for glaucoma screening and checkup Recommended yearly dental visit for hygiene and checkup  Vaccinations: Influenza vaccine: done 01/23/19 Pneumococcal vaccine: done 02/28/19 Tdap vaccine: done 09/29/15 Shingles vaccine: Shingrix discussed. Please contact your pharmacy for coverage information.   Conditions/risks identified: Recommend drinking 6-8 glasses of water per day  Next appointment: Please follow up in one year for your Medicare Annual Wellness visit.    Preventive Care 54 Years and Older, Male Preventive care refers to lifestyle choices and visits with your health care provider that can promote health and wellness. What does preventive care include?  A yearly physical exam. This is also called an annual well check.  Dental exams once or twice a year.  Routine eye exams. Ask your health care provider how often you should have your eyes checked.  Personal lifestyle choices, including:  Daily care of your teeth and gums.  Regular physical activity.  Eating a healthy diet.  Avoiding tobacco and drug use.  Limiting alcohol use.  Practicing safe sex.  Taking low doses of aspirin every day.  Taking vitamin and mineral supplements as recommended by your health care provider. What happens during an annual well check? The services and screenings done by your health care provider during your annual well check will depend on your age, overall health, lifestyle risk factors, and family history of disease. Counseling  Your health care provider may ask you questions about your:  Alcohol use.  Tobacco use.   Drug use.  Emotional well-being.  Home and relationship well-being.  Sexual activity.  Eating habits.  History of falls.  Memory and ability to understand (cognition).  Work and work Statistician. Screening  You may have the following tests or measurements:  Height, weight, and BMI.  Blood pressure.  Lipid and cholesterol levels. These may be checked every 5 years, or more frequently if you are over 64 years old.  Skin check.  Lung cancer screening. You may have this screening every year starting at age 17 if you have a 30-pack-year history of smoking and currently smoke or have quit within the past 15 years.  Fecal occult blood test (FOBT) of the stool. You may have this test every year starting at age 72.  Flexible sigmoidoscopy or colonoscopy. You may have a sigmoidoscopy every 5 years or a colonoscopy every 10 years starting at age 25.  Prostate cancer screening. Recommendations will vary depending on your family history and other risks.  Hepatitis C blood test.  Hepatitis B blood test.  Sexually transmitted disease (STD) testing.  Diabetes screening. This is done by checking your blood sugar (glucose) after you have not eaten for a while (fasting). You may have this done every 1-3 years.  Abdominal aortic aneurysm (AAA) screening. You may need this if you are a current or former smoker.  Osteoporosis. You may be screened starting at age 12 if you are at high risk. Talk with your health care provider about your test results, treatment options, and if necessary, the need for more tests. Vaccines  Your health care provider may recommend certain vaccines, such as:  Influenza vaccine. This is recommended every year.  Tetanus, diphtheria, and acellular pertussis (  Tdap, Td) vaccine. You may need a Td booster every 10 years.  Zoster vaccine. You may need this after age 44.  Pneumococcal 13-valent conjugate (PCV13) vaccine. One dose is recommended after age 65.   Pneumococcal polysaccharide (PPSV23) vaccine. One dose is recommended after age 21. Talk to your health care provider about which screenings and vaccines you need and how often you need them. This information is not intended to replace advice given to you by your health care provider. Make sure you discuss any questions you have with your health care provider. Document Released: 04/30/2015 Document Revised: 12/22/2015 Document Reviewed: 02/02/2015 Elsevier Interactive Patient Education  2017 [place] Prevention in the Home Falls can cause injuries. They can happen to people of all ages. There are many things you can do to make your home safe and to help prevent falls. What can I do on the outside of my home?  Regularly fix the edges of walkways and driveways and fix any cracks.  Remove anything that might make you trip as you walk through a door, such as a raised step or threshold.  Trim any bushes or trees on the path to your home.  Use bright outdoor lighting.  Clear any walking paths of anything that might make someone trip, such as rocks or tools.  Regularly check to see if handrails are loose or broken. Make sure that both sides of any steps have handrails.  Any raised decks and porches should have guardrails on the edges.  Have any leaves, snow, or ice cleared regularly.  Use sand or salt on walking paths during winter.  Clean up any spills in your garage right away. This includes oil or grease spills. What can I do in the bathroom?  Use night lights.  Install grab bars by the toilet and in the tub and shower. Do not use towel bars as grab bars.  Use non-skid mats or decals in the tub or shower.  If you need to sit down in the shower, use a plastic, non-slip stool.  Keep the floor dry. Clean up any water that spills on the floor as soon as it happens.  Remove soap buildup in the tub or shower regularly.  Attach bath mats securely with double-sided non-slip  rug tape.  Do not have throw rugs and other things on the floor that can make you trip. What can I do in the bedroom?  Use night lights.  Make sure that you have a light by your bed that is easy to reach.  Do not use any sheets or blankets that are too big for your bed. They should not hang down onto the floor.  Have a firm chair that has side arms. You can use this for support while you get dressed.  Do not have throw rugs and other things on the floor that can make you trip. What can I do in the kitchen?  Clean up any spills right away.  Avoid walking on wet floors.  Keep items that you use a lot in easy-to-reach places.  If you need to reach something above you, use a strong step stool that has a grab bar.  Keep electrical cords out of the way.  Do not use floor polish or wax that makes floors slippery. If you must use wax, use non-skid floor wax.  Do not have throw rugs and other things on the floor that can make you trip. What can I do with my stairs?  Do  not leave any items on the stairs.  Make sure that there are handrails on both sides of the stairs and use them. Fix handrails that are broken or loose. Make sure that handrails are as long as the stairways.  Check any carpeting to make sure that it is firmly attached to the stairs. Fix any carpet that is loose or worn.  Avoid having throw rugs at the top or bottom of the stairs. If you do have throw rugs, attach them to the floor with carpet tape.  Make sure that you have a light switch at the top of the stairs and the bottom of the stairs. If you do not have them, ask someone to add them for you. What else can I do to help prevent falls?  Wear shoes that:  Do not have high heels.  Have rubber bottoms.  Are comfortable and fit you well.  Are closed at the toe. Do not wear sandals.  If you use a stepladder:  Make sure that it is fully opened. Do not climb a closed stepladder.  Make sure that both sides of  the stepladder are locked into place.  Ask someone to hold it for you, if possible.  Clearly mark and make sure that you can see:  Any grab bars or handrails.  First and last steps.  Where the edge of each step is.  Use tools that help you move around (mobility aids) if they are needed. These include:  Canes.  Walkers.  Scooters.  Crutches.  Turn on the lights when you go into a dark area. Replace any light bulbs as soon as they burn out.  Set up your furniture so you have a clear path. Avoid moving your furniture around.  If any of your floors are uneven, fix them.  If there are any pets around you, be aware of where they are.  Review your medicines with your doctor. Some medicines can make you feel dizzy. This can increase your chance of falling. Ask your doctor what other things that you can do to help prevent falls. This information is not intended to replace advice given to you by your health care provider. Make sure you discuss any questions you have with your health care provider. Document Released: 01/28/2009 Document Revised: 09/09/2015 Document Reviewed: 05/08/2014 Elsevier Interactive Patient Education  2017 [employer].

## 2019-03-28 DIAGNOSIS — Z125 Encounter for screening for malignant neoplasm of prostate: Secondary | ICD-10-CM | POA: Diagnosis not present

## 2019-03-29 LAB — PSA: PSA: 1.2 ng/mL

## 2019-03-31 DIAGNOSIS — M722 Plantar fascial fibromatosis: Secondary | ICD-10-CM | POA: Diagnosis not present

## 2019-03-31 HISTORY — DX: Plantar fascial fibromatosis: M72.2

## 2019-04-01 DIAGNOSIS — H401131 Primary open-angle glaucoma, bilateral, mild stage: Secondary | ICD-10-CM | POA: Diagnosis not present

## 2019-04-02 ENCOUNTER — Telehealth: Payer: Self-pay

## 2019-04-02 DIAGNOSIS — R972 Elevated prostate specific antigen [PSA]: Secondary | ICD-10-CM

## 2019-04-02 NOTE — Telephone Encounter (Signed)
-----   Message from Delsa Grana, [place] sent at 04/02/2019  2:45 PM EST ----- Please notify pt that his PSA is gradually trending up.  I consulted Dr. Ancil Boozer and she advised urology referral just to check on the prostate.  Can you see if he's ever had BPH (enlarged prostate) or seen urology before?   We can refer him for it to be checked.   Overall the number is higher but still in normal range for age.   Thank you

## 2019-04-03 NOTE — Telephone Encounter (Signed)
-----   Message from Delsa Grana, [place] sent at 04/03/2019  9:29 AM EST ----- Try dx increasing PSA or elevated PSA thx

## 2019-04-04 DIAGNOSIS — H401131 Primary open-angle glaucoma, bilateral, mild stage: Secondary | ICD-10-CM | POA: Diagnosis not present

## 2019-04-04 DIAGNOSIS — M7732 Calcaneal spur, left foot: Secondary | ICD-10-CM | POA: Diagnosis not present

## 2019-04-04 DIAGNOSIS — M722 Plantar fascial fibromatosis: Secondary | ICD-10-CM | POA: Diagnosis not present

## 2019-04-28 ENCOUNTER — Emergency Department: Payer: Medicare PPO

## 2019-04-28 ENCOUNTER — Observation Stay
Admission: EM | Admit: 2019-04-28 | Discharge: 2019-04-28 | Disposition: A | Discharge status: Home / Self Care | Payer: Medicare PPO | Attending: Internal Medicine | Admitting: Internal Medicine

## 2019-04-28 ENCOUNTER — Observation Stay: Payer: Medicare PPO

## 2019-04-28 ENCOUNTER — Other Ambulatory Visit: Payer: Self-pay

## 2019-04-28 DIAGNOSIS — I2699 Other pulmonary embolism without acute cor pulmonale: Principal | ICD-10-CM | POA: Diagnosis present

## 2019-04-28 DIAGNOSIS — Z79899 Other long term (current) drug therapy: Secondary | ICD-10-CM | POA: Insufficient documentation

## 2019-04-28 DIAGNOSIS — Z20822 Contact with and (suspected) exposure to covid-19: Secondary | ICD-10-CM | POA: Diagnosis not present

## 2019-04-28 DIAGNOSIS — R1011 Right upper quadrant pain: Secondary | ICD-10-CM | POA: Diagnosis not present

## 2019-04-28 DIAGNOSIS — I10 Essential (primary) hypertension: Secondary | ICD-10-CM | POA: Diagnosis not present

## 2019-04-28 DIAGNOSIS — M109 Gout, unspecified: Secondary | ICD-10-CM | POA: Diagnosis not present

## 2019-04-28 DIAGNOSIS — R0600 Dyspnea, unspecified: Secondary | ICD-10-CM | POA: Insufficient documentation

## 2019-04-28 DIAGNOSIS — R0789 Other chest pain: Secondary | ICD-10-CM | POA: Insufficient documentation

## 2019-04-28 DIAGNOSIS — Z87891 Personal history of nicotine dependence: Secondary | ICD-10-CM | POA: Insufficient documentation

## 2019-04-28 DIAGNOSIS — E785 Hyperlipidemia, unspecified: Secondary | ICD-10-CM | POA: Diagnosis present

## 2019-04-28 DIAGNOSIS — R079 Chest pain, unspecified: Secondary | ICD-10-CM

## 2019-04-28 HISTORY — DX: Other pulmonary embolism without acute cor pulmonale: I26.99

## 2019-04-28 LAB — CBC WITH DIFFERENTIAL/PLATELET
Abs Immature Granulocytes: 0.05 10*3/uL (ref 0.00–0.07)
Basophils Absolute: 0 10*3/uL (ref 0.0–0.1)
Basophils Relative: 0 %
Eosinophils Absolute: 0.1 10*3/uL (ref 0.0–0.5)
Eosinophils Relative: 1 %
HCT: 42.7 % (ref 39.0–52.0)
Hemoglobin: 14.6 g/dL (ref 13.0–17.0)
Immature Granulocytes: 1 %
Lymphocytes Relative: 18 %
Lymphs Abs: 1.4 10*3/uL (ref 0.7–4.0)
MCH: 30.5 pg (ref 26.0–34.0)
MCHC: 34.2 g/dL (ref 30.0–36.0)
MCV: 89.1 fL (ref 80.0–100.0)
Monocytes Absolute: 0.8 10*3/uL (ref 0.1–1.0)
Monocytes Relative: 9 %
Neutro Abs: 5.8 10*3/uL (ref 1.7–7.7)
Neutrophils Relative %: 71 %
Platelets: 171 10*3/uL (ref 150–400)
RBC: 4.79 MIL/uL (ref 4.22–5.81)
RDW: 11.9 % (ref 11.5–15.5)
WBC: 8.2 10*3/uL (ref 4.0–10.5)
nRBC: 0 % (ref 0.0–0.2)

## 2019-04-28 LAB — COMPREHENSIVE METABOLIC PANEL
ALT: 50 U/L — ABNORMAL HIGH (ref 0–44)
AST: 40 U/L (ref 15–41)
Albumin: 3.9 g/dL (ref 3.5–5.0)
Alkaline Phosphatase: 39 U/L (ref 38–126)
Anion gap: 10 (ref 5–15)
BUN: 18 mg/dL (ref 8–23)
CO2: 23 mmol/L (ref 22–32)
Calcium: 8.9 mg/dL (ref 8.9–10.3)
Chloride: 104 mmol/L (ref 98–111)
Creatinine, Ser: 1 mg/dL (ref 0.61–1.24)
GFR calc Af Amer: >60 mL/min (ref >60)
GFR calc non Af Amer: >60 mL/min (ref >60)
Glucose, Bld: 109 mg/dL — ABNORMAL HIGH (ref 70–99)
Potassium: 4.2 mmol/L (ref 3.5–5.1)
Sodium: 137 mmol/L (ref 135–145)
Total Bilirubin: 0.4 mg/dL (ref 0.3–1.2)
Total Protein: 6.9 g/dL (ref 6.5–8.1)

## 2019-04-28 LAB — PROTIME-INR
INR: 0.9 (ref 0.8–1.2)
INR: 1 (ref 0.8–1.2)
Prothrombin Time: 11.8 seconds (ref 11.4–15.2)
Prothrombin Time: 12.7 seconds (ref 11.4–15.2)

## 2019-04-28 LAB — CBC
HCT: 43.3 % (ref 39.0–52.0)
Hemoglobin: 14.6 g/dL (ref 13.0–17.0)
MCH: 30 pg (ref 26.0–34.0)
MCHC: 33.7 g/dL (ref 30.0–36.0)
MCV: 88.9 fL (ref 80.0–100.0)
Platelets: 162 10*3/uL (ref 150–400)
RBC: 4.87 MIL/uL (ref 4.22–5.81)
RDW: 12.1 % (ref 11.5–15.5)
WBC: 7.9 10*3/uL (ref 4.0–10.5)
nRBC: 0 % (ref 0.0–0.2)

## 2019-04-28 LAB — APTT
aPTT: 26 seconds (ref 24–36)
aPTT: >160 seconds (ref 24–36)

## 2019-04-28 LAB — TROPONIN I (HIGH SENSITIVITY)
Troponin I (High Sensitivity): 3 ng/L (ref <18)
Troponin I (High Sensitivity): <2 ng/L (ref <18)

## 2019-04-28 LAB — HIV ANTIBODY (ROUTINE TESTING W REFLEX): HIV Screen 4th Generation wRfx: NONREACTIVE

## 2019-04-28 LAB — LIPASE, BLOOD: Lipase: 24 U/L (ref 11–51)

## 2019-04-28 LAB — SARS CORONAVIRUS 2 (TAT 6-24 HRS): SARS Coronavirus 2: NEGATIVE

## 2019-04-28 LAB — BRAIN NATRIURETIC PEPTIDE: B Natriuretic Peptide: 27 pg/mL (ref 0.0–100.0)

## 2019-04-28 MED ORDER — IOHEXOL 350 MG/ML SOLN
75.0000 mL | Freq: Once | INTRAVENOUS | Status: AC | PRN
  Administered 2019-04-28: 75 mL via INTRAVENOUS

## 2019-04-28 MED ORDER — APIXABAN 5 MG PO TABS
10.0000 mg | ORAL_TABLET | Freq: Two times a day (BID) | ORAL | Status: DC
  Administered 2019-04-28: 10 mg via ORAL
  Filled 2019-04-28: qty 2

## 2019-04-28 MED ORDER — HEPARIN (PORCINE) 25000 UT/250ML-% IV SOLN
1400.0000 [IU]/h | INTRAVENOUS | Status: DC
  Administered 2019-04-28: 1400 [IU]/h via INTRAVENOUS
  Filled 2019-04-28: qty 250

## 2019-04-28 MED ORDER — MORPHINE SULFATE (PF) 2 MG/ML IV SOLN
2.0000 mg | Freq: Once | INTRAVENOUS | Status: AC
  Administered 2019-04-28: 2 mg via INTRAVENOUS
  Filled 2019-04-28: qty 1

## 2019-04-28 MED ORDER — OXYCODONE-ACETAMINOPHEN 5-325 MG PO TABS: 1.0000 | ORAL_TABLET | Freq: Four times a day (QID) | ORAL | 0 refills | Status: DC | PRN

## 2019-04-28 MED ORDER — APIXABAN 5 MG PO TABS: 10.0000 mg | ORAL_TABLET | Freq: Two times a day (BID) | ORAL | 3 refills | Status: DC

## 2019-04-28 MED ORDER — HEPARIN (PORCINE) 25000 UT/250ML-% IV SOLN: 16.0000 [IU]/kg/h | INTRAVENOUS | Status: DC

## 2019-04-28 MED ORDER — OXYCODONE-ACETAMINOPHEN 5-325 MG PO TABS
1.0000 | ORAL_TABLET | Freq: Four times a day (QID) | ORAL | Status: DC | PRN
  Administered 2019-04-28: 1 via ORAL
  Filled 2019-04-28: qty 1

## 2019-04-28 MED ORDER — HEPARIN SODIUM (PORCINE) 5000 UNIT/ML IJ SOLN: 4000.0000 [IU] | Freq: Once | INTRAMUSCULAR | Status: DC

## 2019-04-28 MED ORDER — HEPARIN BOLUS VIA INFUSION
5000.0000 [IU] | Freq: Once | INTRAVENOUS | Status: AC
  Administered 2019-04-28: 5000 [IU] via INTRAVENOUS
  Filled 2019-04-28: qty 5000

## 2019-04-28 MED ORDER — APIXABAN 5 MG PO TABS: 5.0000 mg | ORAL_TABLET | Freq: Two times a day (BID) | ORAL | Status: DC

## 2019-04-28 MED ORDER — ONDANSETRON HCL 4 MG/2ML IJ SOLN
4.0000 mg | Freq: Once | INTRAMUSCULAR | Status: AC
  Administered 2019-04-28: 4 mg via INTRAVENOUS
  Filled 2019-04-28: qty 2

## 2019-04-28 NOTE — ED Provider Notes (Signed)
[facility] Emergency Department Provider Note   ____________________________________________   First MD Initiated Contact with Patient 04/28/19 [phone]     (approximate)  I have reviewed the triage vital signs and the nursing notes.   HISTORY  Chief Complaint Chest pain   HPI Victor Knight is a 69 y.o. male brought to the ED from home via EMS with a chief complaint of chest pain.  Patient reports right-sided chest pain beginning around lunchtime.  Pain began in his right upper quadrant and over time has migrated into his right chest.  Describes aching pain radiating to his right arm.  Not associated with diaphoresis, shortness of breath, nausea/vomiting, palpitations or dizziness.  Pain is not exacerbated with eating, moving or deep breathing.  Thinks pain would be exacerbated if he were to exert himself.  Denies recent fever, cough.  Denies COVID-19 exposure.      Past Medical History:  Diagnosis Date   Abdominal aortic atherosclerosis ([place]) 10/27/2015   Aortic ectasia, abdominal ([place]) 10/27/2015   Gout    Hyperlipidemia    Hypertension    Insomnia     Patient Active Problem List   Diagnosis Date Noted   Medicare annual wellness visit, subsequent 07/03/2017   Tendinitis of shoulder 04/09/2017   Bursitis of shoulder 04/09/2017   Shoulder pain 04/09/2017   Adhesive capsulitis of shoulder 04/09/2017   Dupuytren contracture 04/09/2017   Complete tear of right rotator cuff 04/20/2016   Preventative health care 12/05/2015   Medication monitoring encounter 11/02/2015   Abdominal aortic atherosclerosis ([place]) 10/27/2015   Aortic ectasia, abdominal ([place]) 10/27/2015   Hx of smoking 10/05/2015   History of hepatitis C 10/05/2015   Prostate cancer screening 10/05/2015   Disease of liver 09/29/2015   Hypertension 10/05/2014   Insomnia 10/05/2014   Hyperlipidemia LDL goal <70 10/05/2014   Gout 10/05/2014    Past Surgical  History:  Procedure Laterality Date   CARPAL TUNNEL RELEASE     rotator cuff surgery   04/20/2016   right arm; [facility]    TRIGGER FINGER RELEASE      Prior to Admission medications   Medication Sig Start Date End Date Taking? Authorizing Provider  allopurinol (ZYLOPRIM) 100 MG tablet Take 1 tablet (100 mg total) by mouth daily. 02/26/19   Delsa Grana, PA-C  aspirin EC 81 MG tablet Take 1 tablet (81 mg total) by mouth daily. 04/25/17   Hubbard Hartshorn, FNP  atenolol (TENORMIN) 100 MG tablet Take 1 tablet (100 mg total) by mouth daily. 02/17/19   Delsa Grana, PA-C  colchicine 0.6 MG tablet Two by mouth at onset of flare, followed by one pill one hour later; max of 3 pills every 3 days 08/23/17   Arnetha Courser, MD  ezetimibe (ZETIA) 10 MG tablet Take 1 tablet (10 mg total) by mouth daily. 02/26/19   Delsa Grana, PA-C  LUMIGAN 0.01 % SOLN instill 1 drop into right eye once daily at bedtime 08/14/14   [provider]  Multiple Vitamin (MULTIVITAMIN) tablet Take 1 tablet by mouth daily.    [provider]  rosuvastatin (CRESTOR) 40 MG tablet Take 1 tablet (40 mg total) by mouth daily. 02/17/19   Delsa Grana, PA-C  traZODone (DESYREL) 100 MG tablet TAKE 1/2 TO 1 TABLET BY MOUTH AT BEDTIME IF NEEDED FOR SLEEP 02/26/19   Delsa Grana, PA-C    Allergies Keflex [cephalexin]  Family History  Problem Relation Age of Onset   Hypertension Father  Heart disease Father    Hypertension Brother    Hypertension Sister    Supraventricular tachycardia Daughter    Heart disease Daughter    Hypertension Brother    Hypertension Brother    Hypertension Brother    Heart disease Brother     Social History Social History   Tobacco Use   Smoking status: Former Smoker   Smokeless tobacco: Never Used  Substance Use Topics   Alcohol use: Yes    Alcohol/week: 7.0 standard drinks    Types: 7 Glasses of wine per week   Drug use: No    Review of  Systems  Constitutional: No fever/chills Eyes: No visual changes. ENT: No sore throat. Cardiovascular: Positive for right-sided chest pain. Respiratory: Denies shortness of breath. Gastrointestinal: Positive for right upper quadrant abdominal pain.  No nausea, no vomiting.  No diarrhea.  No constipation. Genitourinary: Negative for dysuria. Musculoskeletal: Negative for back pain. Skin: Negative for rash. Neurological: Negative for headaches, focal weakness or numbness.   ____________________________________________   PHYSICAL EXAM:  VITAL SIGNS: ED Triage Vitals  Enc Vitals Group     BP      Pulse      Resp      Temp      Temp src      SpO2      Weight      Height      Head Circumference      Peak Flow      Pain Score      Pain Loc      Pain Edu?      Excl. in [place]?     Constitutional: Alert and oriented. Well appearing and in no acute distress. Eyes: Conjunctivae are normal. PERRL. EOMI. Head: Atraumatic. Nose: No congestion/rhinnorhea. Mouth/Throat: Mucous membranes are moist.  Oropharynx non-erythematous. Neck: No stridor.   Cardiovascular: Normal rate, regular rhythm. Grossly normal heart sounds.  Good peripheral circulation. Respiratory: Normal respiratory effort.  No retractions. Lungs CTAB. Gastrointestinal: Soft and nontender to D palpation. No distention. No abdominal bruits. No CVA tenderness. Musculoskeletal: No lower extremity tenderness nor edema.  No joint effusions. Neurologic:  Normal speech and language. No gross focal neurologic deficits are appreciated.  Skin:  Skin is warm, dry and intact. No rash noted. Psychiatric: Mood and affect are normal. Speech and behavior are normal.  ____________________________________________   LABS (all labs ordered are listed, but only abnormal results are displayed)  Labs Reviewed  COMPREHENSIVE METABOLIC PANEL - Abnormal; Notable for the following components:      Result Value   Glucose, Bld 109 (*)     ALT 50 (*)    All other components within normal limits  SARS CORONAVIRUS 2 (TAT 6-24 HRS)  CBC WITH DIFFERENTIAL/PLATELET  LIPASE, BLOOD  TROPONIN I (HIGH SENSITIVITY)  TROPONIN I (HIGH SENSITIVITY)   ____________________________________________  EKG  ED ECG REPORT I, Joziyah Roblero J, the attending physician, personally viewed and interpreted this ECG.   Date: 04/28/2019  EKG Time: 0221  Rate: 59  Rhythm: normal EKG, normal sinus rhythm  Axis: Normal  Intervals:none  ST&T Change: Nonspecific  ____________________________________________  RADIOLOGY  ED MD interpretation: No acute cardiopulmonary process; no cholelithiasis; stable aortic ultrasound without aneurysm; discussed CTA with radiologist which indicates bilateral PE all right-sided lung infarct  Official radiology report(s): CT Angio Chest PE W/Cm &/Or Wo Cm  Result Date: 04/28/2019 CLINICAL DATA:  Right-sided chest pain EXAM: CT ANGIOGRAPHY CHEST WITH CONTRAST TECHNIQUE: Multidetector CT imaging of the chest was  performed using the standard protocol during bolus administration of intravenous contrast. Multiplanar CT image reconstructions and MIPs were obtained to evaluate the vascular anatomy. CONTRAST:  21mL OMNIPAQUE IOHEXOL 350 MG/ML SOLN COMPARISON:  None. FINDINGS: Cardiovascular: Bilateral central and branching pulmonary artery filling defects, subsegmental on the left and up to segmental on the right. Most proximal clot is seen in the anterior basal segment of the right lower lobe where there is occlusive clot and lung infarct appearance. The emboli are marked on series 5. There is insufficient clot burden to implicate RV strain. Coronary atherosclerosis in both the left and right circulation. Aortic atherosclerosis. Mediastinum/Nodes: Negative for adenopathy or mass. Lungs/Pleura: Wedge of airspace disease in the subpleural right lower lobe with mild dependent atelectasis on both sides. Subpleural nodule along the lower  right major fissure measuring 5 mm average diameter. No pulmonary edema. Upper Abdomen: Partially covered left renal cystic density. Musculoskeletal: Spondylosis Critical Value/emergent results were called by telephone at the time of interpretation on 04/28/2019 at 4:40 am to providerJADE Kee Drudge , who verbally acknowledged these results. Review of the MIP images confirms the above findings. IMPRESSION: 1. Acute bilateral pulmonary emboli that are segmental to subsegmental in size. A small lung infarct is seen in the right lower lobe. 2. 5 mm subpleural nodule along the lower right major fissure, likely lymph node. No follow-up needed if patient is low-risk. Non-contrast chest CT can be considered in 12 months if patient is high-risk. This recommendation follows the consensus statement: Guidelines for Management of Incidental Pulmonary Nodules Detected on CT Images: From the Fleischner Society 2017; Radiology 2017; 284:228-243. 3.  Aortic Atherosclerosis (ICD10-I70.0).  Coronary atherosclerosis. Electronically Signed   By: Monte Fantasia M.D.   On: 04/28/2019 04:42   US Aorta  Result Date: 04/28/2019 CLINICAL DATA:  Right upper quadrant pain EXAM: ULTRASOUND OF ABDOMINAL AORTA TECHNIQUE: Ultrasound examination of the abdominal aorta and proximal common iliac arteries was performed to evaluate for aneurysm. Additional color and Doppler images of the distal aorta were obtained to document patency. COMPARISON:  October 26, 2015 FINDINGS: Abdominal aortic measurements as follows: Proximal:  2.2 x 2.8 cm Mid:  1.7 x 1.9 cm Distal:  1.9 x 2.0 cm.  Distal aorta is slightly ectatic. Patent: Yes, peak systolic velocity is [id number] cm/s Right common iliac artery: 1.2 x 1.6 cm Left common iliac artery: 1.4 x 1.3 cm IMPRESSION: Slightly ectatic distal aorta without evidence of aneurysm. This does not appear to be significantly changed since the prior exam of 2017. Electronically Signed   By: Prudencio Pair M.D.   On: 04/28/2019 03:48    DG Chest Port 1 View  Result Date: 04/28/2019 CLINICAL DATA:  Chest pain EXAM: PORTABLE CHEST 1 VIEW COMPARISON:  04/28/2019 at 2:15 a.m. FINDINGS: The heart size and mediastinal contours are within normal limits. Both lungs are clear. The visualized skeletal structures are unremarkable. IMPRESSION: No active disease. Electronically Signed   By: Ulyses Jarred M.D.   On: 04/28/2019 02:41   US Abdomen Limited RUQ  Result Date: 04/28/2019 CLINICAL DATA:  Right upper quadrant pain EXAM: ULTRASOUND ABDOMEN LIMITED RIGHT UPPER QUADRANT COMPARISON:  None. FINDINGS: Gallbladder: No gallstones or wall thickening visualized. No sonographic Murphy sign noted by sonographer. Common bile duct: Diameter: 4.0 mm Liver: No focal lesion identified. Within normal limits in parenchymal echogenicity. Portal vein is patent on color Doppler imaging with normal direction of blood flow towards the liver. Other: None. IMPRESSION: Normal examination. Electronically Signed   By: Kerby Moors  [place]u M.D.   On: 04/28/2019 03:49    ____________________________________________   PROCEDURES  Procedure(s) performed (including Critical Care):  Procedures  CRITICAL CARE Performed by: Paulette Blanch   Total critical care time: 45 minutes  Critical care time was exclusive of separately billable procedures and treating other patients.  Critical care was necessary to treat or prevent imminent or life-threatening deterioration.  Critical care was time spent personally by me on the following activities: development of treatment plan with patient and/or surrogate as well as nursing, discussions with consultants, evaluation of patient's response to treatment, examination of patient, obtaining history from patient or surrogate, ordering and performing treatments and interventions, ordering and review of laboratory studies, ordering and review of radiographic studies, pulse oximetry and re-evaluation of patient's  condition.  ____________________________________________   INITIAL IMPRESSION / ASSESSMENT AND PLAN / ED COURSE  As part of my medical decision making, I reviewed the following data within the [place] notes reviewed and incorporated, Labs reviewed, EKG interpreted, Old chart reviewed, Radiograph reviewed and Notes from prior ED visits     Victor Knight was evaluated in Emergency Department on 04/28/2019 for the symptoms described in the history of present illness. He was evaluated in the context of the global COVID-19 pandemic, which necessitated consideration that the patient might be at risk for infection with the SARS-CoV-2 virus that causes COVID-19. Institutional protocols and algorithms that pertain to the evaluation of patients at risk for COVID-19 are in a state of rapid change based on information released by regulatory bodies including the CDC and federal and state organizations. These policies and algorithms were followed during the patient's care in the ED.    69 year old male who presents with right-sided chest pain. Differential diagnosis includes, but is not limited to, ACS, aortic dissection, pulmonary embolism, cardiac tamponade, pneumothorax, pneumonia, pericarditis, myocarditis, GI-related causes including esophagitis/gastritis, and musculoskeletal chest wall pain.    Rates pain currently 4/10.  Reviewed old chart and noted patient had abdominal ultrasound in 2017 indicated of aortic ectasia 2.5 cm.  Given pain began in the right upper quadrant, will obtain ultrasound to evaluate cholelithiasis as well as aorta.   Clinical Course as of Apr 27 445  Mon Apr 28, 2019  0404 Updated patient of all test results thus far including imaging studies.  Will draw a repeat troponin.  States now it hurts his chest when he takes a deep breath.  Will obtain CT to evaluate for PE.   [JS]    Clinical Course User Index [JS] Paulette Blanch, MD      ____________________________________________   FINAL CLINICAL IMPRESSION(S) / ED DIAGNOSES  Final diagnoses:  Chest pain, unspecified type  Other acute pulmonary embolism, unspecified whether acute cor pulmonale present [facility])  Pulmonary infarct [facility])     ED Discharge Orders    None       Note:  This document was prepared using Dragon voice recognition software and may include unintentional dictation errors.   Paulette Blanch, MD 04/28/19 [phone]

## 2019-04-28 NOTE — H&P (Signed)
History and Physical    Victor Knight [id number] DOB: 22-Mar-1951 DOA: 04/28/2019  PCP: Delsa Grana, PA-C   Patient coming from: home  I have personally briefly reviewed patient's old medical records in [place]  Chief Complaint: Right-sided chest pain  HPI: Victor Knight is a 69 y.o. male with medical history significant for hypertension who presents to the emergency room with a complaint of right upper quadrant pain radiating into the right chest pain on deep inspiration .  He denied fever or chills.  Denied nausea vomiting or diaphoresis ED Course: On arrival in the emergency room temperature was 98.3 blood pressure 147/93 heart rate 59 respirations 18 with O2 sat 94% on room air.  WBC was 8.2 hemoglobin 14.  Chemistries unremarkable, troponin normal, lipase negative and chemistries unremarkable.  He had a right upper quadrant sonogram that showed no acute abnormality.  CTA chest showed bilateral PE.  Patient was started on a heparin bolus followed by infusion and hospitalist consulted for admission  Review of Systems: As per HPI otherwise 10 point review of systems negative.    Past Medical History:  Diagnosis Date  . Abdominal aortic atherosclerosis ([place]) 10/27/2015  . Aortic ectasia, abdominal ([place]) 10/27/2015  . Gout   . Hyperlipidemia   . Hypertension   . Insomnia     Past Surgical History:  Procedure Laterality Date  . CARPAL TUNNEL RELEASE    . rotator cuff surgery   04/20/2016   right arm; [facility]   . TRIGGER FINGER RELEASE       reports that he has quit smoking. He has never used smokeless tobacco. He reports current alcohol use of about 7.0 standard drinks of alcohol per week. He reports that he does not use drugs.  Allergies  Allergen Reactions  . Keflex [Cephalexin] Rash    Family History  Problem Relation Age of Onset  . Hypertension Father   . Heart disease Father   . Hypertension Brother   . Hypertension Sister   . Supraventricular  tachycardia Daughter   . Heart disease Daughter   . Hypertension Brother   . Hypertension Brother   . Hypertension Brother   . Heart disease Brother      Prior to Admission medications   Medication Sig Start Date End Date Taking? Authorizing Provider  allopurinol (ZYLOPRIM) 100 MG tablet Take 1 tablet (100 mg total) by mouth daily. 02/26/19  Yes Delsa Grana, PA-C  aspirin EC 81 MG tablet Take 1 tablet (81 mg total) by mouth daily. 04/25/17  Yes Hubbard Hartshorn, FNP  atenolol (TENORMIN) 100 MG tablet Take 1 tablet (100 mg total) by mouth daily. 02/17/19  Yes Delsa Grana, PA-C  ezetimibe (ZETIA) 10 MG tablet Take 1 tablet (10 mg total) by mouth daily. 02/26/19  Yes Tapia, Leisa, PA-C  LUMIGAN 0.01 % SOLN Place 1 drop into the right eye at bedtime.  08/14/14  Yes [provider]  meloxicam (MOBIC) 15 MG tablet Take 15 mg by mouth daily. 04/04/19  Yes [provider]  Multiple Vitamin (MULTIVITAMIN) tablet Take 1 tablet by mouth daily.   Yes [provider]  rosuvastatin (CRESTOR) 40 MG tablet Take 1 tablet (40 mg total) by mouth daily. 02/17/19  Yes Delsa Grana, PA-C  traZODone (DESYREL) 100 MG tablet TAKE 1/2 TO 1 TABLET BY MOUTH AT BEDTIME IF NEEDED FOR SLEEP Patient taking differently: Take 50-100 mg by mouth at bedtime as needed for sleep.  02/26/19  Yes Delsa Grana, PA-C  Physical Exam: Vitals:   04/28/19 0225 04/28/19 0300 04/28/19 0330 04/28/19 0400  BP:  131/82 133/69 129/84  Pulse:  (!) 53 (!) 51 (!) 50  Resp:  16 17 (!) 26  Temp:      SpO2:  94% 93% 94%  Weight: 86.2 kg     Height: 5\' 9"  (1.753 m)        Vitals:   04/28/19 0225 04/28/19 0300 04/28/19 0330 04/28/19 0400  BP:  131/82 133/69 129/84  Pulse:  (!) 53 (!) 51 (!) 50  Resp:  16 17 (!) 26  Temp:      SpO2:  94% 93% 94%  Weight: 86.2 kg     Height: 5\' 9"  (1.753 m)       Constitutional: NAD, alert and oriented x 3 Eyes: PERRL, lids and conjunctivae normal ENMT: Mucous membranes  are moist.  Neck: normal, supple, no masses, no thyromegaly Respiratory: clear to auscultation bilaterally, no wheezing, no crackles. Normal respiratory effort. No accessory muscle use.  Cardiovascular: Regular rate and rhythm, no murmurs / rubs / gallops. No extremity edema. 2+ pedal pulses. No carotid bruits.  Abdomen: no tenderness, no masses palpated. No hepatosplenomegaly. Bowel sounds positive.  Musculoskeletal: no clubbing / cyanosis. No joint deformity upper and lower extremities.  Skin: no rashes, lesions, ulcers.  Neurologic: No gross focal neurologic deficit. Psychiatric: Normal mood and affect.   Labs on Admission: I have personally reviewed following labs and imaging studies  CBC: Recent Labs  Lab 04/28/19 0256  WBC 8.2  NEUTROABS 5.8  HGB 14.6  HCT 42.7  MCV 89.1  PLT [id number]   Basic Metabolic Panel: Recent Labs  Lab 04/28/19 0256  NA 137  K 4.2  CL 104  CO2 23  GLUCOSE 109*  BUN 18  CREATININE 1.00  CALCIUM 8.9   GFR: Estimated Creatinine Clearance: 76.9 mL/min (by C-G formula based on SCr of 1 mg/dL). Liver Function Tests: Recent Labs  Lab 04/28/19 0256  AST 40  ALT 50*  ALKPHOS 39  BILITOT 0.4  PROT 6.9  ALBUMIN 3.9   Recent Labs  Lab 04/28/19 0256  LIPASE 24   No results for input(s): AMMONIA in the last 168 hours. Coagulation Profile: Recent Labs  Lab 04/28/19 0256  INR 0.9   Cardiac Enzymes: No results for input(s): CKTOTAL, CKMB, CKMBINDEX, TROPONINI in the last 168 hours. BNP (last 3 results) No results for input(s): PROBNP in the last 8760 hours. HbA1C: No results for input(s): HGBA1C in the last 72 hours. CBG: No results for input(s): GLUCAP in the last 168 hours. Lipid Profile: No results for input(s): CHOL, HDL, LDLCALC, TRIG, CHOLHDL, LDLDIRECT in the last 72 hours. Thyroid Function Tests: No results for input(s): TSH, T4TOTAL, FREET4, T3FREE, THYROIDAB in the last 72 hours. Anemia Panel: No results for input(s):  VITAMINB12, FOLATE, FERRITIN, TIBC, IRON, RETICCTPCT in the last 72 hours. Urine analysis: No results found for: COLORURINE, APPEARANCEUR, LABSPEC, PHURINE, GLUCOSEU, HGBUR, BILIRUBINUR, KETONESUR, PROTEINUR, UROBILINOGEN, NITRITE, LEUKOCYTESUR  Radiological Exams on Admission: CT Angio Chest PE W/Cm &/Or Wo Cm  Result Date: 04/28/2019 CLINICAL DATA:  Right-sided chest pain EXAM: CT ANGIOGRAPHY CHEST WITH CONTRAST TECHNIQUE: Multidetector CT imaging of the chest was performed using the standard protocol during bolus administration of intravenous contrast. Multiplanar CT image reconstructions and MIPs were obtained to evaluate the vascular anatomy. CONTRAST:  50mL OMNIPAQUE IOHEXOL 350 MG/ML SOLN COMPARISON:  None. FINDINGS: Cardiovascular: Bilateral central and branching pulmonary artery filling defects, subsegmental on the left  and up to segmental on the right. Most proximal clot is seen in the anterior basal segment of the right lower lobe where there is occlusive clot and lung infarct appearance. The emboli are marked on series 5. There is insufficient clot burden to implicate RV strain. Coronary atherosclerosis in both the left and right circulation. Aortic atherosclerosis. Mediastinum/Nodes: Negative for adenopathy or mass. Lungs/Pleura: Wedge of airspace disease in the subpleural right lower lobe with mild dependent atelectasis on both sides. Subpleural nodule along the lower right major fissure measuring 5 mm average diameter. No pulmonary edema. Upper Abdomen: Partially covered left renal cystic density. Musculoskeletal: Spondylosis Critical Value/emergent results were called by telephone at the time of interpretation on 04/28/2019 at 4:40 am to providerJADE SUNG , who verbally acknowledged these results. Review of the MIP images confirms the above findings. IMPRESSION: 1. Acute bilateral pulmonary emboli that are segmental to subsegmental in size. A small lung infarct is seen in the right lower lobe.  2. 5 mm subpleural nodule along the lower right major fissure, likely lymph node. No follow-up needed if patient is low-risk. Non-contrast chest CT can be considered in 12 months if patient is high-risk. This recommendation follows the consensus statement: Guidelines for Management of Incidental Pulmonary Nodules Detected on CT Images: From the Fleischner Society 2017; Radiology 2017; 284:228-243. 3.  Aortic Atherosclerosis (ICD10-I70.0).  Coronary atherosclerosis. Electronically Signed   By: Monte Fantasia M.D.   On: 04/28/2019 04:42   US Aorta  Result Date: 04/28/2019 CLINICAL DATA:  Right upper quadrant pain EXAM: ULTRASOUND OF ABDOMINAL AORTA TECHNIQUE: Ultrasound examination of the abdominal aorta and proximal common iliac arteries was performed to evaluate for aneurysm. Additional color and Doppler images of the distal aorta were obtained to document patency. COMPARISON:  October 26, 2015 FINDINGS: Abdominal aortic measurements as follows: Proximal:  2.2 x 2.8 cm Mid:  1.7 x 1.9 cm Distal:  1.9 x 2.0 cm.  Distal aorta is slightly ectatic. Patent: Yes, peak systolic velocity is [id number] cm/s Right common iliac artery: 1.2 x 1.6 cm Left common iliac artery: 1.4 x 1.3 cm IMPRESSION: Slightly ectatic distal aorta without evidence of aneurysm. This does not appear to be significantly changed since the prior exam of 2017. Electronically Signed   By: Prudencio Pair M.D.   On: 04/28/2019 03:48   DG Chest Port 1 View  Result Date: 04/28/2019 CLINICAL DATA:  Chest pain EXAM: PORTABLE CHEST 1 VIEW COMPARISON:  04/28/2019 at 2:15 a.m. FINDINGS: The heart size and mediastinal contours are within normal limits. Both lungs are clear. The visualized skeletal structures are unremarkable. IMPRESSION: No active disease. Electronically Signed   By: Ulyses Jarred M.D.   On: 04/28/2019 02:41   US Abdomen Limited RUQ  Result Date: 04/28/2019 CLINICAL DATA:  Right upper quadrant pain EXAM: ULTRASOUND ABDOMEN LIMITED RIGHT UPPER  QUADRANT COMPARISON:  None. FINDINGS: Gallbladder: No gallstones or wall thickening visualized. No sonographic Murphy sign noted by sonographer. Common bile duct: Diameter: 4.0 mm Liver: No focal lesion identified. Within normal limits in parenchymal echogenicity. Portal vein is patent on color Doppler imaging with normal direction of blood flow towards the liver. Other: None. IMPRESSION: Normal examination. Electronically Signed   By: Prudencio Pair M.D.   On: 04/28/2019 03:49    EKG: Independently reviewed.   Assessment/Plan Active Problems:   Hypertension Continue home meds    Bilateral pulmonary embolism (HCC) Unprovoked.  Further work-up for hypercoagulable state Continue heparin per protocol O2 as needed to keep sats  over 92%      DVT prophylaxis: on full dose anticoagulation Code Status: full code  Family Communication: none  Disposition Plan: Back to previous home environment Consults called: none     Athena Masse MD [facility]     04/28/2019, 5:47 AM

## 2019-04-28 NOTE — Discharge Summary (Addendum)
Physician Discharge Summary  Victor Knight [id number] DOB: [birth year] DOA: 04/28/2019  PCP: Delsa Grana, PA-C  Admit date: 04/28/2019 Discharge date: 04/28/2019  Admitted From: Home Disposition: Home  Recommendations for Outpatient Follow-up:  #1 follow up with PCP in 1 week.  Patient is being discharged on Eliquis for acute bilateral pulmonary embolism.  He needs to be on anticoagulation for at least 6 months given unprovoked PE and check for hypercoagulable work-up once off anticoagulation. #2.  Recommend follow-up CT chest in 12 months for finding of 2.5 mm subpleural nodule along the lower right major fissure.  (Patient has history of smoking)  Home Health: None Equipment/Devices: None  Discharge Condition: Fair CODE STATUS: Full code Diet recommendation: Regular    Discharge Diagnoses:  Principal Problem:   Bilateral pulmonary embolism (HCC)  Active Problems: Essential hypertension   Hyperlipidemia LDL goal <70  Brief narrative/HPI 69 year old male with hypertension presented with right upper quadrant pain radiating to the right chest, worsened with deep inspiration.  Denies any fevers, chills, nausea, hematemesis, diaphoresis, syncope, vomiting, abdominal pain bowel or urinary symptoms.  No recent travel, illness, sedentary lifestyle trauma to the extremities or surgery.  In the ED vitals were stable including O2 sat of 94% on room air.  Blood work unremarkable.  Right upper quadrant ultrasound and ultrasound of the aorta was unremarkable.  A CT angiogram of the chest showed acute bilateral pulmonary embolism with a small right lower lobe lung infarct. No right heart strain.  Hospital course Acute bilateral pulmonary embolism With associated small right lower lobe infarction.  Patient hemodynamically stable and maintaining O2 sat on room air in the mid to high 90s, both at rest and on ambulation.  He was started on IV heparin drip. Discussed long-term anticoagulation  option including the route of administration, risks and benefits.  Patient decided to be on Eliquis.  Etiology of PE is unclear and since it is unprovoked,  he will need at least 6 months of anticoagulation.  Following which she will need hypercoagulable work-up and decide on further anticoagulation. Doppler lower extremity done and was negative for DVT in bilateral LE.  Patient is stable to be discharged home.  Will prescribe him some Percocet for chest pain symptoms.  Also explained in detail that patient should return to the ED if he has any possible complication including worsening shortness of breath, respiratory distress, chest pain, dizziness or syncope.  Also discussed all possible bleeding risk with anticoagulation.   Essential hypertension Continue atenolol  Dyslipidemia Continue statin  Procedure: CT angiogram chest, ultrasound abdomen, ultrasound aorta, Doppler lower extremity Consults: None Disposition: Home   Discharge Instructions   Allergies as of 04/28/2019      Reactions   Keflex [cephalexin] Rash      Medication List    STOP taking these medications   meloxicam 15 MG tablet Commonly known as: MOBIC     TAKE these medications   allopurinol 100 MG tablet Commonly known as: ZYLOPRIM Take 1 tablet (100 mg total) by mouth daily.   apixaban 5 MG Tabs tablet Commonly known as: ELIQUIS Take 2 tablets (10 mg total) by mouth 2 (two) times daily. 2 tablets (10 mg) 2 times a day day for 7 days (until 1/18), then 1 tablet (5 mg) 2 times a day.   aspirin EC 81 MG tablet Take 1 tablet (81 mg total) by mouth daily.   atenolol 100 MG tablet Commonly known as: TENORMIN Take 1 tablet (100 mg total) by mouth  daily.   ezetimibe 10 MG tablet Commonly known as: Zetia Take 1 tablet (10 mg total) by mouth daily.   Lumigan 0.01 % Soln Generic drug: bimatoprost Place 1 drop into the right eye at bedtime.   multivitamin tablet Take 1 tablet by mouth daily.    oxyCODONE-acetaminophen 5-325 MG tablet Commonly known as: PERCOCET/ROXICET Take 1 tablet by mouth every 6 (six) hours as needed for moderate pain.   rosuvastatin 40 MG tablet Commonly known as: CRESTOR Take 1 tablet (40 mg total) by mouth daily.   traZODone 100 MG tablet Commonly known as: DESYREL TAKE 1/2 TO 1 TABLET BY MOUTH AT BEDTIME IF NEEDED FOR SLEEP What changed:   how much to take  how to take this  when to take this  reasons to take this  additional instructions      Follow-up Information    Delsa Grana, PA-C. Schedule an appointment as soon as possible for a visit in 1 week(s).   Specialty: Family Medicine Contact information: [address] [place] [address] [phone]          Allergies  Allergen Reactions  . Keflex [Cephalexin] Rash     Procedures/Studies: CT Angio Chest PE W/Cm &/Or Wo Cm  Result Date: 04/28/2019 CLINICAL DATA:  Right-sided chest pain EXAM: CT ANGIOGRAPHY CHEST WITH CONTRAST TECHNIQUE: Multidetector CT imaging of the chest was performed using the standard protocol during bolus administration of intravenous contrast. Multiplanar CT image reconstructions and MIPs were obtained to evaluate the vascular anatomy. CONTRAST:  94mL OMNIPAQUE IOHEXOL 350 MG/ML SOLN COMPARISON:  None. FINDINGS: Cardiovascular: Bilateral central and branching pulmonary artery filling defects, subsegmental on the left and up to segmental on the right. Most proximal clot is seen in the anterior basal segment of the right lower lobe where there is occlusive clot and lung infarct appearance. The emboli are marked on series 5. There is insufficient clot burden to implicate RV strain. Coronary atherosclerosis in both the left and right circulation. Aortic atherosclerosis. Mediastinum/Nodes: Negative for adenopathy or mass. Lungs/Pleura: Wedge of airspace disease in the subpleural right lower lobe with mild dependent atelectasis on both sides.  Subpleural nodule along the lower right major fissure measuring 5 mm average diameter. No pulmonary edema. Upper Abdomen: Partially covered left renal cystic density. Musculoskeletal: Spondylosis Critical Value/emergent results were called by telephone at the time of interpretation on 04/28/2019 at 4:40 am to providerJADE SUNG , who verbally acknowledged these results. Review of the MIP images confirms the above findings. IMPRESSION: 1. Acute bilateral pulmonary emboli that are segmental to subsegmental in size. A small lung infarct is seen in the right lower lobe. 2. 5 mm subpleural nodule along the lower right major fissure, likely lymph node. No follow-up needed if patient is low-risk. Non-contrast chest CT can be considered in 12 months if patient is high-risk. This recommendation follows the consensus statement: Guidelines for Management of Incidental Pulmonary Nodules Detected on CT Images: From the Fleischner Society 2017; Radiology 2017; 284:228-243. 3.  Aortic Atherosclerosis (ICD10-I70.0).  Coronary atherosclerosis. Electronically Signed   By: Monte Fantasia M.D.   On: 04/28/2019 04:42   US Aorta  Result Date: 04/28/2019 CLINICAL DATA:  Right upper quadrant pain EXAM: ULTRASOUND OF ABDOMINAL AORTA TECHNIQUE: Ultrasound examination of the abdominal aorta and proximal common iliac arteries was performed to evaluate for aneurysm. Additional color and Doppler images of the distal aorta were obtained to document patency. COMPARISON:  October 26, 2015 FINDINGS: Abdominal aortic measurements as follows: Proximal:  2.2 x 2.8 cm Mid:  1.7 x 1.9 cm Distal:  1.9 x 2.0 cm.  Distal aorta is slightly ectatic. Patent: Yes, peak systolic velocity is [id number] cm/s Right common iliac artery: 1.2 x 1.6 cm Left common iliac artery: 1.4 x 1.3 cm IMPRESSION: Slightly ectatic distal aorta without evidence of aneurysm. This does not appear to be significantly changed since the prior exam of 2017. Electronically Signed   By: Prudencio Pair M.D.   On: 04/28/2019 03:48   DG Chest Port 1 View  Result Date: 04/28/2019 CLINICAL DATA:  Chest pain EXAM: PORTABLE CHEST 1 VIEW COMPARISON:  04/28/2019 at 2:15 a.m. FINDINGS: The heart size and mediastinal contours are within normal limits. Both lungs are clear. The visualized skeletal structures are unremarkable. IMPRESSION: No active disease. Electronically Signed   By: Ulyses Jarred M.D.   On: 04/28/2019 02:41   US Abdomen Limited RUQ  Result Date: 04/28/2019 CLINICAL DATA:  Right upper quadrant pain EXAM: ULTRASOUND ABDOMEN LIMITED RIGHT UPPER QUADRANT COMPARISON:  None. FINDINGS: Gallbladder: No gallstones or wall thickening visualized. No sonographic Murphy sign noted by sonographer. Common bile duct: Diameter: 4.0 mm Liver: No focal lesion identified. Within normal limits in parenchymal echogenicity. Portal vein is patent on color Doppler imaging with normal direction of blood flow towards the liver. Other: None. IMPRESSION: Normal examination. Electronically Signed   By: Prudencio Pair M.D.   On: 04/28/2019 03:49       Subjective: Elderly male not in distress.  Reports right-sided chest discomfort with deep inspiration.  Discharge Exam: Vitals:   04/28/19 1415 04/28/19 1430  BP:  131/75  Pulse: 61 (!) 58  Resp: 14 19  Temp:    SpO2: 91% 91%   Vitals:   04/28/19 1345 04/28/19 1400 04/28/19 1415 04/28/19 1430  BP:  131/84  131/75  Pulse: 63 62 61 (!) 58  Resp: 13 19 14 19   Temp:      SpO2: 91% 91% 91% 91%  Weight:      Height:        General: Elderly male not in distress HEENT: Moist mucosa, supple neck Chest: Fine bibasilar crackles, no rhonchi or wheeze CVs: Normal S1-S2, no murmurs GI: Soft, nondistended, nontender Musculoskeletal: Warm, no edema, no calf swelling or tenderness.    The results of significant diagnostics from this hospitalization (including imaging, microbiology, ancillary and laboratory) are listed below for reference.      Microbiology: No results found for this or any previous visit (from the past 240 hour(s)).   Labs: BNP (last 3 results) Recent Labs    04/28/19 0653  BNP [id number]   Basic Metabolic Panel: Recent Labs  Lab 04/28/19 0256  NA 137  K 4.2  CL 104  CO2 23  GLUCOSE 109*  BUN 18  CREATININE 1.00  CALCIUM 8.9   Liver Function Tests: Recent Labs  Lab 04/28/19 0256  AST 40  ALT 50*  ALKPHOS 39  BILITOT 0.4  PROT 6.9  ALBUMIN 3.9   Recent Labs  Lab 04/28/19 0256  LIPASE 24   No results for input(s): AMMONIA in the last 168 hours. CBC: Recent Labs  Lab 04/28/19 0256 04/28/19 0653  WBC 8.2 7.9  NEUTROABS 5.8  --   HGB 14.6 14.6  HCT 42.7 43.3  MCV 89.1 88.9  PLT 171 162   Cardiac Enzymes: No results for input(s): CKTOTAL, CKMB, CKMBINDEX, TROPONINI in the last 168 hours. BNP: Invalid input(s): POCBNP CBG: No results for input(s): GLUCAP  in the last 168 hours. D-Dimer No results for input(s): DDIMER in the last 72 hours. Hgb A1c No results for input(s): HGBA1C in the last 72 hours. Lipid Profile No results for input(s): CHOL, HDL, LDLCALC, TRIG, CHOLHDL, LDLDIRECT in the last 72 hours. Thyroid function studies No results for input(s): TSH, T4TOTAL, T3FREE, THYROIDAB in the last 72 hours.  Invalid input(s): FREET3 Anemia work up No results for input(s): VITAMINB12, FOLATE, FERRITIN, TIBC, IRON, RETICCTPCT in the last 72 hours. Urinalysis No results found for: COLORURINE, APPEARANCEUR, LABSPEC, [place], GLUCOSEU, HGBUR, BILIRUBINUR, KETONESUR, PROTEINUR, UROBILINOGEN, NITRITE, LEUKOCYTESUR Sepsis Labs Invalid input(s): PROCALCITONIN,  WBC,  LACTICIDVEN Microbiology No results found for this or any previous visit (from the past 240 hour(s)).   Time coordinating discharge: 25 minutes  SIGNED:   Louellen Molder, MD  [facility] 04/28/2019, 2:52 PM Pager   If 7PM-7AM, please contact night-coverage [email or web address] Password [email or web address]

## 2019-04-28 NOTE — Progress Notes (Addendum)
ANTICOAGULATION CONSULT NOTE - Initial Consult  Pharmacy Consult for Apixaban Dosing Indication: pulmonary embolus  Allergies  Allergen Reactions  . Keflex [Cephalexin] Rash    Patient Measurements: Height: 5\' 9"  (175.3 cm) Weight: 190 lb (86.2 kg) IBW/kg (Calculated) : 70.7 Heparin Dosing Weight: 86.2 kg  Vital Signs: Temp: 98.3 F (36.8 C) (01/11 0224) BP: 129/80 (01/11 1030) Pulse Rate: 52 (01/11 1030)  Labs: Recent Labs    04/28/19 0256 04/28/19 0424 04/28/19 0653  HGB 14.6  --  14.6  HCT 42.7  --  43.3  PLT 171  --  162  APTT 26  --  >160*  LABPROT 11.8  --  12.7  INR 0.9  --  1.0  CREATININE 1.00  --   --   TROPONINIHS 3 <2  --     Estimated Creatinine Clearance: 76.9 mL/min (by C-G formula based on SCr of 1 mg/dL).   Medical History: Past Medical History:  Diagnosis Date  . Abdominal aortic atherosclerosis ([place]) 10/27/2015  . Aortic ectasia, abdominal ([place]) 10/27/2015  . Gout   . Hyperlipidemia   . Hypertension   . Insomnia    Assessment: Patient is a 69yo male admitted for PE. Pharmacy consulted to transition from Heparin to Apixaban dosing.  Goal of Therapy:  Heparin level 0.3-0.7 units/ml Monitor platelets by anticoagulation protocol: Yes   Plan:  Will order Apixaban 10mg  PO BID x 7 days, followed by 5mg  PO BID with the first dose to be given at the discontinuation of the heparin infusion. Will continue to monitor H&H and order CBC at least once every 3 days per protocol.  Pearla Dubonnet, PharmD Clinical Pharmacist 04/28/2019 11:21 AM

## 2019-04-28 NOTE — ED Notes (Signed)
Pt given breakfast tray

## 2019-04-28 NOTE — ED Triage Notes (Signed)
Pt arrives to ED from home via [facility] with c/c of right side chest pain beginning at lunchtime on 1/10. Pt states pain onset after lunch yesterday. Pt had chicken soup. Pain reported as right sided, radiating down right arm. Denies F/C, NVD, SoB. Denies any significant cardiac Hx other than hyperlipidemia and HTN. Pt took four 81mg  baby aspirin at approx 0115. EMS reports transport vitals of 1432/98, p64 NSR, 95% on room air, temp 98.2 oral. Upon arrival, pt A&Ox4, NAD, no respiratory Sx evident. Dr Beather Arbour at pt bedside.

## 2019-04-28 NOTE — ED Notes (Signed)
Ambulated pt on RA in room. Pt denies any CP, SOB or dizziness. Pt's O2 at 94-95%. Pt states he feels better standing up and walking. Pt states it takes some of the pressure off. Pt assisted back to bed. Pt sitting up on the edge. Advised pt I would grab him some pain medication that MD just put order in for.

## 2019-04-28 NOTE — ED Notes (Addendum)
Critical high result notification for this patient received from the lab: APTT [id number]   Dr. Lenise Arena notified.  Acknowledged and read-back.  No new orders verbalized at this time.

## 2019-04-28 NOTE — TOC Initial Note (Signed)
Transition of Care [facility]) - Initial/Assessment Note    Patient Details  Name: Victor Knight MRN: [id number] Date of Birth: August 10, 1950  [facility]) CM/SW Contact:    Shelbie Ammons, RN Phone Number: 04/28/2019, 4:01 PM  Clinical Narrative:         RNCM consulted due to need for medication assistance. RNCM assessed patient at bedside. Patient reports to feeling some better and is thankful he came to ER to find out what was going on. Patient reports that everyone has been very nice to him and he has been pleased with his experience. Patient reports that he normally still drives and is independent in all ADLs. Patient provided with Apixaban card. Denies any further needs at this time.             Expected Discharge Plan: Home/Self Care Barriers to Discharge: Barriers Resolved   Patient Goals and CMS Choice Patient states their goals for this hospitalization and ongoing recovery are:: I want to get back home, I'm glad I came to find out what was going on.      Expected Discharge Plan and Services Expected Discharge Plan: Home/Self Care   Discharge Planning Services: Medication Assistance   Living arrangements for the past 2 months: Single Family Home Expected Discharge Date: 04/28/19                                    Prior Living Arrangements/Services Living arrangements for the past 2 months: Single Family Home Lives with:: Spouse Patient language and need for interpreter reviewed:: Yes Do you feel safe going back to the place where you live?: Yes      Need for Family Participation in Patient Care: Yes (Comment) Care giver support system in place?: Yes (comment)   Criminal Activity/Legal Involvement Pertinent to Current Situation/Hospitalization: No - Comment as needed  Activities of Daily Living      Permission Sought/Granted                  Emotional Assessment Appearance:: Appears stated age Attitude/Demeanor/Rapport: Engaged Affect  (typically observed): Appropriate Orientation: : Oriented to Situation, Oriented to  Time, Oriented to Place, Oriented to Self   Psych Involvement: No (comment)  Admission diagnosis:  Bilateral pulmonary embolism ([place]) [I26.99] Patient Active Problem List   Diagnosis Date Noted  . Bilateral pulmonary embolism ([place]) 04/28/2019  . Medicare annual wellness visit, subsequent 07/03/2017  . Tendinitis of shoulder 04/09/2017  . Bursitis of shoulder 04/09/2017  . Shoulder pain 04/09/2017  . Adhesive capsulitis of shoulder 04/09/2017  . Dupuytren contracture 04/09/2017  . Complete tear of right rotator cuff 04/20/2016  . Preventative health care 12/05/2015  . Medication monitoring encounter 11/02/2015  . Abdominal aortic atherosclerosis ([place]) 10/27/2015  . Aortic ectasia, abdominal ([place]) 10/27/2015  . Hx of smoking 10/05/2015  . History of hepatitis C 10/05/2015  . Prostate cancer screening 10/05/2015  . Disease of liver 09/29/2015  . Hypertension 10/05/2014  . Insomnia 10/05/2014  . Hyperlipidemia LDL goal <70 10/05/2014  . Gout 10/05/2014   PCP:  Delsa Grana, PA-C Pharmacy:   [facility] [place], [place] - [address] [place] [address] Phone: [phone] Fax: [phone]  [facility] [id number], [place] AT [facility] OF [address] [place] [address] Phone: [phone]  Fax: [phone]     Social Determinants of Health (SDOH) Interventions    Readmission Risk Interventions No flowsheet data found.

## 2019-04-28 NOTE — ED Notes (Signed)
Admit MD at bedside

## 2019-04-28 NOTE — ED Notes (Signed)
Pt placed on 2L via nasal cannula.

## 2019-04-28 NOTE — Discharge Instructions (Signed)
Pulmonary Embolism  A pulmonary embolism (PE) is a sudden blockage or decrease of blood flow in one or both lungs. Most blockages come from a blood clot that forms in the vein of a lower leg, thigh, or arm (deep vein thrombosis, DVT) and travels to the lungs. A clot is blood that has thickened into a gel or solid. PE is a dangerous and life-threatening condition that needs to be treated right away. What are the causes? This condition is usually caused by a blood clot that forms in a vein and moves to the lungs. In rare cases, it may be caused by air, fat, part of a tumor, or other tissue that moves through the veins and into the lungs. What increases the risk? The following factors may make you more likely to develop this condition:  Experiencing a traumatic injury, such as breaking a hip or leg.  Having: ? A spinal cord injury. ? Orthopedic surgery, especially hip or knee replacement. ? Any major surgery. ? A stroke. ? DVT. ? Blood clots or blood clotting disease. ? Long-term (chronic) lung or heart disease. ? Cancer treated with chemotherapy. ? A central venous catheter.  Taking medicines that contain estrogen. These include birth control pills and hormone replacement therapy.  Being: ? Pregnant. ? In the period of time after your baby is delivered (postpartum). ? Older than age 16. ? Overweight. ? A smoker, especially if you have other risks. What are the signs or symptoms? Symptoms of this condition usually start suddenly and include:  Shortness of breath during activity or at rest.  Coughing, coughing up blood, or coughing up blood-tinged mucus.  Chest pain that is often worse with deep breaths.  Rapid or irregular heartbeat.  Feeling light-headed or dizzy.  Fainting.  Feeling anxious.  Fever.  Sweating.  Pain and swelling in a leg. This is a symptom of DVT, which can lead to PE. How is this diagnosed? This condition may be diagnosed based on:  Your medical  history.  A physical exam.  Blood tests.  CT pulmonary angiogram. This test checks blood flow in and around your lungs.  Ventilation-perfusion scan, also called a lung VQ scan. This test measures air flow and blood flow to the lungs.  An ultrasound of the legs. How is this treated? Treatment for this condition depends on many factors, such as the cause of your PE, your risk for bleeding or developing more clots, and other medical conditions you have. Treatment aims to remove, dissolve, or stop blood clots from forming or growing larger. Treatment may include:  Medicines, such as: ? Blood thinning medicines (anticoagulants) to stop clots from forming. ? Medicines that dissolve clots (thrombolytics).  Procedures, such as: ? Using a flexible tube to remove a blood clot (embolectomy) or to deliver medicine to destroy it (catheter-directed thrombolysis). ? Inserting a filter into a large vein that carries blood to the heart (inferior vena cava). This filter (vena cava filter) catches blood clots before they reach the lungs. ? Surgery to remove the clot (surgical embolectomy). This is rare. You may need a combination of immediate, long-term (up to 3 months after diagnosis), and extended (more than 3 months after diagnosis) treatments. Your treatment may continue for several months (maintenance therapy). You and your health care provider will work together to choose the treatment program that is best for you. Follow these instructions at home: Medicines  Take over-the-counter and prescription medicines only as told by your health care provider.  If you  are taking an anticoagulant medicine: ? Take the medicine every day at the same time each day. ? Understand what foods and drugs interact with your medicine. ? Understand the side effects of this medicine, including excessive bruising or bleeding. Ask your health care provider or pharmacist about other side effects. General  instructions  Wear a medical alert bracelet or carry a medical alert card that says you have had a PE and lists what medicines you take.  Ask your health care provider when you may return to your normal activities. Avoid sitting or lying for a long time without moving.  Maintain a healthy weight. Ask your health care provider what weight is healthy for you.  Do not use any products that contain nicotine or tobacco, such as cigarettes, e-cigarettes, and chewing tobacco. If you need help quitting, ask your health care provider.  Talk with your health care provider about any travel plans. It is important to make sure that you are still able to take your medicine while on trips.  Keep all follow-up visits as told by your health care provider. This is important. Contact a health care provider if:  You missed a dose of your blood thinner medicine. Get help right away if:  You have: ? New or increased pain, swelling, warmth, or redness in an arm or leg. ? Numbness or tingling in an arm or leg. ? Shortness of breath during activity or at rest. ? A fever. ? Chest pain. ? A rapid or irregular heartbeat. ? A severe headache. ? Vision changes. ? A serious fall or accident, or you hit your head. ? Stomach (abdominal) pain. ? Blood in your vomit, stool, or urine. ? A cut that will not stop bleeding.  You cough up blood.  You feel light-headed or dizzy.  You cannot move your arms or legs.  You are confused or have memory loss. These symptoms may represent a serious problem that is an emergency. Do not wait to see if the symptoms will go away. Get medical help right away. Call your local emergency services (911 in the [place] Do not drive yourself to the hospital. Summary  A pulmonary embolism (PE) is a sudden blockage or decrease of blood flow in one or both lungs. PE is a dangerous and life-threatening condition that needs to be treated right away.  Treatments for this condition usually  include medicines to thin your blood (anticoagulants) or medicines to break apart blood clots (thrombolytics).  If you are given blood thinners, it is important to take the medicine every day at the same time each day.  Understand what foods and drugs interact with any medicines that you are taking.  If you have signs of PE or DVT, call your local emergency services (911 in the [place] This information is not intended to replace advice given to you by your health care provider. Make sure you discuss any questions you have with your health care provider. Document Revised: 01/09/2018 Document Reviewed: 01/09/2018 Elsevier Patient Education  2020 Elsevier Inc.  Apixaban oral tablets What is this medicine? APIXABAN (a PIX a ban) is an anticoagulant (blood thinner). It is used to lower the chance of stroke in people with a medical condition called atrial fibrillation. It is also used to treat or prevent blood clots in the lungs or in the veins. This medicine may be used for other purposes; ask your health care provider or pharmacist if you have questions. COMMON BRAND NAME(S): Eliquis What should I tell my   health care provider before I take this medicine? They need to know if you have any of these conditions:  antiphospholipid antibody syndrome  bleeding disorders  bleeding in the brain  blood in your stools (black or tarry stools) or if you have blood in your vomit  history of blood clots  history of stomach bleeding  kidney disease  liver disease  mechanical heart valve  an unusual or allergic reaction to apixaban, other medicines, foods, dyes, or preservatives  pregnant or trying to get pregnant  breast-feeding How should I use this medicine? Take this medicine by mouth with a glass of water. Follow the directions on the prescription label. You can take it with or without food. If it upsets your stomach, take it with food. Take your medicine at regular intervals. Do not take  it more often than directed. Do not stop taking except on your doctor's advice. Stopping this medicine may increase your risk of a blood clot. Be sure to refill your prescription before you run out of medicine. Talk to your pediatrician regarding the use of this medicine in children. Special care may be needed. Overdosage: If you think you have taken too much of this medicine contact a poison control center or emergency room at once. NOTE: This medicine is only for you. Do not share this medicine with others. What if I miss a dose? If you miss a dose, take it as soon as you can. If it is almost time for your next dose, take only that dose. Do not take double or extra doses. What may interact with this medicine? This medicine may interact with the following:  aspirin and aspirin-like medicines  certain medicines for fungal infections like ketoconazole and itraconazole  certain medicines for seizures like carbamazepine and phenytoin  certain medicines that treat or prevent blood clots like warfarin, enoxaparin, and dalteparin  clarithromycin  NSAIDs, medicines for pain and inflammation, like ibuprofen or naproxen  rifampin  ritonavir  St. John's wort This list may not describe all possible interactions. Give your health care provider a list of all the medicines, herbs, non-prescription drugs, or dietary supplements you use. Also tell them if you smoke, drink alcohol, or use illegal drugs. Some items may interact with your medicine. What should I watch for while using this medicine? Visit your healthcare professional for regular checks on your progress. You may need blood work done while you are taking this medicine. Your condition will be monitored carefully while you are receiving this medicine. It is important not to miss any appointments. Avoid sports and activities that might cause injury while you are using this medicine. Severe falls or injuries can cause unseen bleeding. Be careful  when using sharp tools or knives. Consider using an electric razor. Take special care brushing or flossing your teeth. Report any injuries, bruising, or red spots on the skin to your healthcare professional. If you are going to need surgery or other procedure, tell your healthcare professional that you are taking this medicine. Wear a medical ID bracelet or chain. Carry a card that describes your disease and details of your medicine and dosage times. What side effects may I notice from receiving this medicine? Side effects that you should report to your doctor or health care professional as soon as possible:  allergic reactions like skin rash, itching or hives, swelling of the face, lips, or tongue  signs and symptoms of bleeding such as bloody or black, tarry stools; red or dark-brown urine; spitting up   blood or brown material that looks like coffee grounds; red spots on the skin; unusual bruising or bleeding from the eye, gums, or nose  signs and symptoms of a blood clot such as chest pain; shortness of breath; pain, swelling, or warmth in the leg  signs and symptoms of a stroke such as changes in vision; confusion; trouble speaking or understanding; severe headaches; sudden numbness or weakness of the face, arm or leg; trouble walking; dizziness; loss of coordination This list may not describe all possible side effects. Call your doctor for medical advice about side effects. You may report side effects to FDA at 1-800-FDA-1088. Where should I keep my medicine? Keep out of the reach of children. Store at room temperature between 20 and 25 degrees C (68 and 77 degrees F). Throw away any unused medicine after the expiration date. NOTE: This sheet is a summary. It may not cover all possible information. If you have questions about this medicine, talk to your doctor, pharmacist, or health care provider.  2020 Elsevier/Gold Standard (2017-12-12 17:39:34)   

## 2019-04-28 NOTE — ED Notes (Signed)
Pt given meal tray and updated on status of doppler exam pending covid result

## 2019-04-28 NOTE — ED Notes (Signed)
Messaged MD Admit Dhungel regarding pt's O2 status and possibly plan to go home.

## 2019-04-28 NOTE — Progress Notes (Signed)
ANTICOAGULATION CONSULT NOTE - Initial Consult  Pharmacy Consult for Heparin Infusion Indication: pulmonary embolus  Allergies  Allergen Reactions  . Keflex [Cephalexin] Rash    Patient Measurements: Height: 5\' 9"  (175.3 cm) Weight: 190 lb (86.2 kg) IBW/kg (Calculated) : 70.7 Heparin Dosing Weight: 86.2 kg  Vital Signs: Temp: 98.3 F (36.8 C) (01/11 0224) BP: 129/84 (01/11 0400) Pulse Rate: 50 (01/11 0400)  Labs: Recent Labs    04/28/19 0256 04/28/19 0424  HGB 14.6  --   HCT 42.7  --   PLT 171  --   CREATININE 1.00  --   TROPONINIHS 3 <2    Estimated Creatinine Clearance: 76.9 mL/min (by C-G formula based on SCr of 1 mg/dL).   Medical History: Past Medical History:  Diagnosis Date  . Abdominal aortic atherosclerosis ([place]) 10/27/2015  . Aortic ectasia, abdominal ([place]) 10/27/2015  . Gout   . Hyperlipidemia   . Hypertension   . Insomnia    Assessment: Patient is a 69yo male admitted for PE. Pharmacy consulted for Heparin dosing.  Goal of Therapy:  Heparin level 0.3-0.7 units/ml Monitor platelets by anticoagulation protocol: Yes   Plan:  Give 5000 units bolus x 1 Start heparin infusion at 1400 units/hr Check anti-Xa level in 6 hours and daily while on heparin Continue to monitor H&H and platelets  Victor Knight, PharmD, BCPS 04/28/2019 5:02 AM

## 2019-05-06 ENCOUNTER — Encounter: Payer: Self-pay | Admitting: Family Medicine

## 2019-05-08 ENCOUNTER — Ambulatory Visit: Payer: PPO | Admitting: Urology

## 2019-05-09 ENCOUNTER — Ambulatory Visit: Payer: PPO | Admitting: Family Medicine

## 2019-05-09 ENCOUNTER — Other Ambulatory Visit: Payer: Self-pay

## 2019-05-09 ENCOUNTER — Encounter: Payer: Self-pay | Admitting: Family Medicine

## 2019-05-09 VITALS — BP 140/86 | HR 69 | Temp 98.1°F | Resp 16 | Ht 70.0 in | Wt 182.5 lb

## 2019-05-09 DIAGNOSIS — Z7901 Long term (current) use of anticoagulants: Secondary | ICD-10-CM | POA: Diagnosis not present

## 2019-05-09 DIAGNOSIS — Z09 Encounter for follow-up examination after completed treatment for conditions other than malignant neoplasm: Secondary | ICD-10-CM | POA: Diagnosis not present

## 2019-05-09 DIAGNOSIS — I700 Atherosclerosis of aorta: Secondary | ICD-10-CM

## 2019-05-09 DIAGNOSIS — I77811 Abdominal aortic ectasia: Secondary | ICD-10-CM

## 2019-05-09 DIAGNOSIS — I10 Essential (primary) hypertension: Secondary | ICD-10-CM

## 2019-05-09 DIAGNOSIS — Z8249 Family history of ischemic heart disease and other diseases of the circulatory system: Secondary | ICD-10-CM

## 2019-05-09 DIAGNOSIS — E785 Hyperlipidemia, unspecified: Secondary | ICD-10-CM

## 2019-05-09 DIAGNOSIS — I2699 Other pulmonary embolism without acute cor pulmonale: Secondary | ICD-10-CM

## 2019-05-09 DIAGNOSIS — Z5181 Encounter for therapeutic drug level monitoring: Secondary | ICD-10-CM | POA: Diagnosis not present

## 2019-05-09 DIAGNOSIS — I2510 Atherosclerotic heart disease of native coronary artery without angina pectoris: Secondary | ICD-10-CM

## 2019-05-09 NOTE — Progress Notes (Signed)
Patient ID: Victor Knight, male    DOB: [birth year], 69 y.o.   MRN: [id number]  PCP: Delsa Grana, PA-C  Chief Complaint  Patient presents with  . Follow-up    ER was dx with blood clots in lungs  . Referral    pulmonologist     Subjective:   Victor Knight is a 69 y.o. male, presents to clinic for HFU/ER visit dx with bilateral PE.  Pt presented to the ER on 04/28/2019 after gradual onset of right sided CP that was worsening, it became so severe that he called [phone] and EMS brought him to the ER.  His pain originally started in his right upper quadrant and then started to move up his chest they originally in the ER did right upper quadrant ultrasound, EKG and basic labs.  Patient was being reevaluated after mostly unremarkable work-up and was good to be discharged home but that he noted pleuritic chest pain that have been worsening and a CT angio was done IMPRESSION: 1. Acute bilateral pulmonary emboli that are segmental to subsegmental in size. A small lung infarct is seen in the right lower lobe. 2. 5 mm subpleural nodule along the lower right major fissure, likely lymph node. No follow-up needed if patient is low-risk. Non-contrast chest CT can be considered in 12 months if patient is high-risk. This recommendation follows the consensus statement: Guidelines for Management of Incidental Pulmonary Nodules Detected on CT Images: From the Fleischner Society 2017; Radiology 2017; 284:228-243. 3. Aortic Atherosclerosis (ICD10-I70.0). Coronary atherosclerosis. He was started on Eliquis after meeting in the ER for short period of observation and was discharged home. He is currently taking Eliquis 5 mg twice a day.  He has not had any spontaneous bruising, any hematuria melena, hematochezia.  Patient states prior to his ER visit he denies any prolonged period of immobility, no long car rides or flights, denies any trauma injury or strain, no past medical history of blood clots or  coagulopathy for him or in his family medical history that he knows of.  He had not had any leg pain or unilateral swelling. About a month prior to the ER visit he did have a injection by podiatry, being treated for left plantar fasciitis  Since being home he feels much better he denies any exertional SOB or CP.  He has not gotten back to his regular routine or activity level is not having any limitations or concerns he denies palpitations, orthopnea, PND, near syncope, dyspnea   He states the ER told him to ask for referrals but he is not sure who he is supposed to follow-up with  Discussed findings from the CT angio -  Pulmonary infarct very small, Small 37mm lung nodule  -patient is a former smoker  CT scan also showed known aortic atherosclerosis, abdominal aortic ectasia was unchanged from images from 3 years ago.  Evidence of some coronary artery atherosclerosis without any past history of CAD, cardiac work-up for ACS was negative and patient denies ever seeing a cardiologist but he does have history of heart disease, hypertension in multiple family members.     Patient Active Problem List   Diagnosis Date Noted  . Bilateral pulmonary embolism ([place]) 04/28/2019  . Medicare annual wellness visit, subsequent 07/03/2017  . Tendinitis of shoulder 04/09/2017  . Bursitis of shoulder 04/09/2017  . Shoulder pain 04/09/2017  . Adhesive capsulitis of shoulder 04/09/2017  . Dupuytren contracture 04/09/2017  . Complete tear of right rotator cuff 04/20/2016  .  Preventative health care 12/05/2015  . Medication monitoring encounter 11/02/2015  . Abdominal aortic atherosclerosis ([place]) 10/27/2015  . Aortic ectasia, abdominal ([place]) 10/27/2015  . Hx of smoking 10/05/2015  . History of hepatitis C 10/05/2015  . Prostate cancer screening 10/05/2015  . Disease of liver 09/29/2015  . Hypertension 10/05/2014  . Insomnia 10/05/2014  . Hyperlipidemia LDL goal <70 10/05/2014  . Gout 10/05/2014       Current Outpatient Medications:  .  allopurinol (ZYLOPRIM) 100 MG tablet, Take 1 tablet (100 mg total) by mouth daily., Disp: 90 tablet, Rfl: 3 .  apixaban (ELIQUIS) 5 MG TABS tablet, Take 2 tablets (10 mg total) by mouth 2 (two) times daily. 2 tablets (10 mg) 2 times a day day for 7 days (until 1/18), then 1 tablet (5 mg) 2 times a day., Disp: 60 tablet, Rfl: 3 .  aspirin EC 81 MG tablet, Take 1 tablet (81 mg total) by mouth daily., Disp: , Rfl:  .  atenolol (TENORMIN) 100 MG tablet, Take 1 tablet (100 mg total) by mouth daily., Disp: 90 tablet, Rfl: 3 .  ezetimibe (ZETIA) 10 MG tablet, Take 1 tablet (10 mg total) by mouth daily., Disp: 90 tablet, Rfl: 1 .  LUMIGAN 0.01 % SOLN, Place 1 drop into the right eye at bedtime. , Disp: , Rfl: 0 .  Multiple Vitamin (MULTIVITAMIN) tablet, Take 1 tablet by mouth daily., Disp: , Rfl:  .  rosuvastatin (CRESTOR) 40 MG tablet, Take 1 tablet (40 mg total) by mouth daily., Disp: 90 tablet, Rfl: 3 .  traZODone (DESYREL) 100 MG tablet, TAKE 1/2 TO 1 TABLET BY MOUTH AT BEDTIME IF NEEDED FOR SLEEP (Patient taking differently: Take 50-100 mg by mouth at bedtime as needed for sleep. ), Disp: 90 tablet, Rfl: 3 .  oxyCODONE-acetaminophen (PERCOCET/ROXICET) 5-325 MG tablet, Take 1 tablet by mouth every 6 (six) hours as needed for moderate pain. (Patient not taking: Reported on 05/09/2019), Disp: 15 tablet, Rfl: 0   Allergies  Allergen Reactions  . Keflex [Cephalexin] Rash     Family History  Problem Relation Age of Onset  . Hypertension Father   . Heart disease Father   . Hypertension Brother   . Hypertension Sister   . Supraventricular tachycardia Daughter   . Heart disease Daughter   . Hypertension Brother   . Hypertension Brother   . Hypertension Brother   . Heart disease Brother      Social History   Socioeconomic History  . Marital status: Married    Spouse name: [name]  . Number of children: 1  . Years of education: Not on file  .  Highest education level: High school graduate  Occupational History  . Occupation: Retired  Tobacco Use  . Smoking status: Former Research scientist (life sciences)  . Smokeless tobacco: Never Used  Substance and Sexual Activity  . Alcohol use: Yes    Alcohol/week: 7.0 standard drinks    Types: 7 Glasses of wine per week  . Drug use: No  . Sexual activity: Yes    Partners: Female  Other Topics Concern  . Not on file  Social History Narrative  . Not on file   Social Determinants of Health   Financial Resource Strain:   . Difficulty of Paying Living Expenses: Not on file  Food Insecurity:   . Worried About Charity fundraiser in the Last Year: Not on file  . Ran Out of Food in the Last Year: Not on file  Transportation Needs:   .  Lack of Transportation (Medical): Not on file  . Lack of Transportation (Non-Medical): Not on file  Physical Activity: Inactive  . Days of Exercise per Week: 0 days  . Minutes of Exercise per Session: 0 min  Stress:   . Feeling of Stress : Not on file  Social Connections:   . Frequency of Communication with Friends and Family: Not on file  . Frequency of Social Gatherings with Friends and Family: Not on file  . Attends Religious Services: Not on file  . Active Member of Clubs or Organizations: Not on file  . Attends Archivist Meetings: Not on file  . Marital Status: Not on file  Intimate Partner Violence:   . Fear of Current or Ex-Partner: Not on file  . Emotionally Abused: Not on file  . Physically Abused: Not on file  . Sexually Abused: Not on file    Chart Review Today: I personally reviewed active problem list, medication list, allergies, family history, social history, health maintenance, notes from last encounter, lab results, imaging with the patient/caregiver today.   Review of Systems  Constitutional: Negative.   HENT: Negative.   Eyes: Negative.   Respiratory: Negative.   Cardiovascular: Negative.   Gastrointestinal: Negative.   Endocrine:  Negative.   Genitourinary: Negative.   Musculoskeletal: Negative.   Skin: Negative.   Allergic/Immunologic: Negative.   Neurological: Negative.   Hematological: Negative.   Psychiatric/Behavioral: Negative.   All other systems reviewed and are negative.      Objective:   Vitals:   05/09/19 0919  BP: 140/86  Pulse: 69  Resp: 16  Temp: 98.1 F (36.7 C)  SpO2: 97%  Weight: 182 lb 8 oz (82.8 kg)  Height: 5\' 10"  (1.778 m)    Body mass index is 26.19 kg/m.  Physical Exam Vitals and nursing note reviewed.  Constitutional:      General: He is not in acute distress.    Appearance: Normal appearance. He is well-developed. He is not ill-appearing, toxic-appearing or diaphoretic.     Interventions: Face mask in place.  HENT:     Head: Normocephalic and atraumatic.     Jaw: No trismus.     Right Ear: External ear normal.     Left Ear: External ear normal.  Eyes:     General: Lids are normal. No scleral icterus.    Conjunctiva/sclera: Conjunctivae normal.     Pupils: Pupils are equal, round, and reactive to light.  Neck:     Trachea: Trachea and phonation normal. No tracheal deviation.  Cardiovascular:     Rate and Rhythm: Normal rate and regular rhythm.     Pulses: Normal pulses.          Radial pulses are 2+ on the right side and 2+ on the left side.       Posterior tibial pulses are 2+ on the right side and 2+ on the left side.     Heart sounds: Normal heart sounds. No murmur. No friction rub. No gallop.   Pulmonary:     Effort: Pulmonary effort is normal. No respiratory distress.     Breath sounds: Normal breath sounds. No stridor. No wheezing, rhonchi or rales.  Abdominal:     General: Bowel sounds are normal. There is no distension.     Palpations: Abdomen is soft.     Tenderness: There is no abdominal tenderness. There is no guarding or rebound.  Musculoskeletal:        General: Normal range of motion.  Cervical back: Normal range of motion and neck supple.      Right lower leg: No edema.     Left lower leg: No edema.  Skin:    General: Skin is warm and dry.     Capillary Refill: Capillary refill takes less than 2 seconds.     Coloration: Skin is not jaundiced.     Findings: No rash.     Nails: There is no clubbing.  Neurological:     Mental Status: He is alert.     Cranial Nerves: No dysarthria or facial asymmetry.     Motor: No tremor or abnormal muscle tone.     Gait: Gait normal.  Psychiatric:        Mood and Affect: Mood normal.        Speech: Speech normal.        Behavior: Behavior normal. Behavior is cooperative.      Results for orders placed or performed during the hospital encounter of 04/28/19  SARS CORONAVIRUS 2 (TAT 6-24 HRS) Nasopharyngeal Nasopharyngeal Swab   Specimen: Nasopharyngeal Swab  Result Value Ref Range   SARS Coronavirus 2 NEGATIVE NEGATIVE  CBC with Differential  Result Value Ref Range   WBC 8.2 4.0 - 10.5 K/uL   RBC 4.79 4.22 - 5.81 MIL/uL   Hemoglobin 14.6 13.0 - 17.0 g/dL   HCT 42.7 39.0 - 52.0 %   MCV 89.1 80.0 - 100.0 fL   MCH 30.5 26.0 - 34.0 pg   MCHC 34.2 30.0 - 36.0 g/dL   RDW 11.9 11.5 - 15.5 %   Platelets 171 150 - 400 K/uL   nRBC 0.0 0.0 - 0.2 %   Neutrophils Relative % 71 %   Neutro Abs 5.8 1.7 - 7.7 K/uL   Lymphocytes Relative 18 %   Lymphs Abs 1.4 0.7 - 4.0 K/uL   Monocytes Relative 9 %   Monocytes Absolute 0.8 0.1 - 1.0 K/uL   Eosinophils Relative 1 %   Eosinophils Absolute 0.1 0.0 - 0.5 K/uL   Basophils Relative 0 %   Basophils Absolute 0.0 0.0 - 0.1 K/uL   Immature Granulocytes 1 %   Abs Immature Granulocytes 0.05 0.00 - 0.07 K/uL  Comprehensive metabolic panel  Result Value Ref Range   Sodium 137 135 - 145 mmol/L   Potassium 4.2 3.5 - 5.1 mmol/L   Chloride 104 98 - 111 mmol/L   CO2 23 22 - 32 mmol/L   Glucose, Bld 109 (H) 70 - 99 mg/dL   BUN 18 8 - 23 mg/dL   Creatinine, Ser 1.00 0.61 - 1.24 mg/dL   Calcium 8.9 8.9 - 10.3 mg/dL   Total Protein 6.9 6.5 - 8.1 g/dL    Albumin 3.9 3.5 - 5.0 g/dL   AST 40 15 - 41 U/L   ALT 50 (H) 0 - 44 U/L   Alkaline Phosphatase 39 38 - 126 U/L   Total Bilirubin 0.4 0.3 - 1.2 mg/dL   GFR calc non Af Amer >60 >60 mL/min   GFR calc Af Amer >60 >60 mL/min   Anion gap 10 5 - 15  Lipase, blood  Result Value Ref Range   Lipase 24 11 - 51 U/L  APTT  Result Value Ref Range   aPTT 26 24 - 36 seconds  Protime-INR  Result Value Ref Range   Prothrombin Time 11.8 11.4 - 15.2 seconds   INR 0.9 0.8 - 1.2  HIV Antibody (routine testing w rflx)  Result Value Ref  Range   HIV Screen 4th Generation wRfx NON REACTIVE NON REACTIVE  CBC  Result Value Ref Range   WBC 7.9 4.0 - 10.5 K/uL   RBC 4.87 4.22 - 5.81 MIL/uL   Hemoglobin 14.6 13.0 - 17.0 g/dL   HCT 43.3 39.0 - 52.0 %   MCV 88.9 80.0 - 100.0 fL   MCH 30.0 26.0 - 34.0 pg   MCHC 33.7 30.0 - 36.0 g/dL   RDW 12.1 11.5 - 15.5 %   Platelets 162 150 - 400 K/uL   nRBC 0.0 0.0 - 0.2 %  Brain natriuretic peptide  Result Value Ref Range   B Natriuretic Peptide 27.0 0.0 - 100.0 pg/mL  Protime-INR  Result Value Ref Range   Prothrombin Time 12.7 11.4 - 15.2 seconds   INR 1.0 0.8 - 1.2  APTT  Result Value Ref Range   aPTT >160 (HH) 24 - 36 seconds  Troponin I (High Sensitivity)  Result Value Ref Range   Troponin I (High Sensitivity) 3 <18 ng/L  Troponin I (High Sensitivity)  Result Value Ref Range   Troponin I (High Sensitivity) <2 <18 ng/L        Assessment & Plan:      ICD-10-CM   1. Bilateral pulmonary embolism (HCC)  I26.99    on eliquis, no bleeding concerns, no CP or SOB, unknown cause of PE, possibly related to procedure one month prior? no known coagulopathy, refer heme  2. Encounter for examination following treatment at hospital  Z09    All records reviewed including multiple CTs, labs  3. Anticoagulated by anticoagulation treatment  123456 COMPLETE METABOLIC PANEL WITH GFR    CBC with Differential/Platelet   Doing well on Eliquis 5 mg twice a day  4.  Encounter for medication monitoring  [id number] COMPLETE METABOLIC PANEL WITH GFR    CBC with Differential/Platelet  5. Abdominal aortic atherosclerosis (HCC)  I70.0    On high intensity statin and Zetia  6. Aortic ectasia, abdominal ([place])  I77.811    Diagnosed in 2017 no significant changes with recent ER imaging  7. Hyperlipidemia LDL goal <70  E78.5    on high intensity statin and zetia, last labs ~2 months ago, lipid panel optimal  8. Essential hypertension  I10    stable, well controlled  9. Atherosclerosis of coronary artery of native heart without angina pectoris, unspecified vessel or lesion type  I25.10    Incidental finding on CT angio from ER visit - family hx, evidence of diffuse atherosclerotic disease on maximum medical management, cardiology eval? no sxf  10. Family history of heart disease  Z82.49    multiple family members, daughter, brothers, parents    multiple well controlled chronic conditions, several new Dx with ER visit - PE, CAD, lung infarct, reviewed labs, visit documentation, all imaging, family history.  Patient requesting referral that he was told he should get from the ER -hematology referral for coagulopathy work-up?  No known stasis or injury immediately prior to PE and unknown if he has any coagulopathy none that he knows of and no family history.  DVT study was negative  No evidence of coronary artery disease without any current or recent symptoms of ACS/unstable angina patient has been very active most of his life and up until ER visit did not have any chest pain no exertional symptoms in the past -does have a strong family history would likely benefit from a cardiology evaluation at least to further assess CAD, do a stress test  see if he needs any further work-up imaging or procedures.  CT "Coronary atherosclerosis in both the left and right circulation"  Patient has no history of lung disease, he is a former smoker -quit more than 40 years ago, denies ever  needing inhalers or getting bronchitis frequently.  CT angio did show lung infarct although it is very small and a very small nodule discussed the 75-month follow-up -with 2 small findings we can refer him to pulmonology for consult as well  CT also showed some spondylosis, patient has history of gout Is also a left renal cystic density that was not completely visualized      Delsa Grana, PA-C 05/09/19 9:42 AM

## 2019-05-10 LAB — CBC WITH DIFFERENTIAL/PLATELET
Absolute Monocytes: 553 cells/uL (ref 200–950)
Basophils Absolute: 29 cells/uL (ref 0–200)
Basophils Relative: 0.5 %
Eosinophils Absolute: 68 cells/uL (ref 15–500)
Eosinophils Relative: 1.2 %
HCT: 43.9 % (ref 38.5–50.0)
Hemoglobin: 14.9 g/dL (ref 13.2–17.1)
Lymphs Abs: 1835 cells/uL (ref 850–3900)
MCH: 30.4 pg (ref 27.0–33.0)
MCHC: 33.9 g/dL (ref 32.0–36.0)
MCV: 89.6 fL (ref 80.0–100.0)
MPV: 9.2 fL (ref 7.5–12.5)
Monocytes Relative: 9.7 %
Neutro Abs: 3215 cells/uL (ref 1500–7800)
Neutrophils Relative %: 56.4 %
Platelets: 216 10*3/uL (ref 140–400)
RBC: 4.9 10*6/uL (ref 4.20–5.80)
RDW: 11.8 % (ref 11.0–15.0)
Total Lymphocyte: 32.2 %
WBC: 5.7 10*3/uL (ref 3.8–10.8)

## 2019-05-10 LAB — COMPLETE METABOLIC PANEL WITH GFR
AG Ratio: 1.6 (calc) (ref 1.0–2.5)
ALT: 78 U/L — ABNORMAL HIGH (ref 9–46)
AST: 56 U/L — ABNORMAL HIGH (ref 10–35)
Albumin: 4.1 g/dL (ref 3.6–5.1)
Alkaline phosphatase (APISO): 40 U/L (ref 35–144)
BUN: 16 mg/dL (ref 7–25)
CO2: 25 mmol/L (ref 20–32)
Calcium: 9.4 mg/dL (ref 8.6–10.3)
Chloride: 107 mmol/L (ref 98–110)
Creat: 1.06 mg/dL (ref 0.70–1.25)
GFR, Est African American: 83 mL/min/{1.73_m2} (ref >=60)
GFR, Est Non African American: 72 mL/min/{1.73_m2} (ref >=60)
Globulin: 2.5 g/dL (calc) (ref 1.9–3.7)
Glucose, Bld: 112 mg/dL — ABNORMAL HIGH (ref 65–99)
Potassium: 4.4 mmol/L (ref 3.5–5.3)
Sodium: 140 mmol/L (ref 135–146)
Total Bilirubin: 0.4 mg/dL (ref 0.2–1.2)
Total Protein: 6.6 g/dL (ref 6.1–8.1)

## 2019-05-12 ENCOUNTER — Other Ambulatory Visit: Payer: Self-pay | Admitting: Family Medicine

## 2019-05-12 DIAGNOSIS — F5101 Primary insomnia: Secondary | ICD-10-CM

## 2019-05-12 NOTE — Telephone Encounter (Signed)
Requested Prescriptions  Pending Prescriptions Disp Refills  . traZODone (DESYREL) 100 MG tablet [Pharmacy Med Name: TRAZODONE 100MG  TABLETS] 90 tablet 3    Sig: TAKE 1/2 TO 1 TABLET BY MOUTH AT BEDTIME AS NEEDED FOR SLEEP     Psychiatry: Antidepressants - Serotonin Modulator Passed - 05/12/2019  7:15 AM      Passed - Valid encounter within last 6 months    Recent Outpatient Visits          3 days ago Encounter for medication monitoring   [facility] Delsa Grana, PA-C   2 months ago Hyperlipidemia LDL goal <70   [facility] Delsa Grana, PA-C   8 months ago Essential hypertension   [place], NP   1 year ago Upper respiratory tract infection, unspecified type   [place], Bethel Born, NP   1 year ago Essential hypertension   [place], Satira Anis, MD      Future Appointments            In 3 days [facility] [place], Herbert Seta, MD [place]   In 1 month Delsa Grana, PA-C [facility], [place]   In 3 months Delsa Grana, PA-C [facility], [place]   In 10 months  [facility], [facility] [place]

## 2019-05-15 ENCOUNTER — Encounter: Payer: Self-pay | Admitting: Urology

## 2019-05-15 ENCOUNTER — Other Ambulatory Visit: Payer: Self-pay | Admitting: Family Medicine

## 2019-05-15 ENCOUNTER — Encounter: Payer: Self-pay | Admitting: Family Medicine

## 2019-05-15 ENCOUNTER — Ambulatory Visit: Payer: PPO | Admitting: Urology

## 2019-05-15 ENCOUNTER — Other Ambulatory Visit: Payer: Self-pay

## 2019-05-15 VITALS — BP 138/87 | HR 69 | Ht 69.0 in | Wt 182.0 lb

## 2019-05-15 DIAGNOSIS — Z125 Encounter for screening for malignant neoplasm of prostate: Secondary | ICD-10-CM

## 2019-05-15 DIAGNOSIS — I2699 Other pulmonary embolism without acute cor pulmonale: Secondary | ICD-10-CM

## 2019-05-15 DIAGNOSIS — Z7901 Long term (current) use of anticoagulants: Secondary | ICD-10-CM

## 2019-05-15 DIAGNOSIS — R911 Solitary pulmonary nodule: Secondary | ICD-10-CM

## 2019-05-15 NOTE — Progress Notes (Signed)
05/15/19 2:58 PM   Victor Knight [birth year] [id number]  Referring provider: Delsa Grana, PA-C [address] [place],  [place] [address]  CC: PSA screening  HPI: I saw Victor Knight in urology clinic for PSA screening.  He is a 69 year old male who was recently diagnosed with bilateral pulmonary embolus and started on Eliquis who was recently found to have a PSA of 1.2.  This is relatively stable from his prior values of 0.8 in November 2019, 0.6 in October 2018, and 0.7 in June 2017.  He denies any urinary symptoms, history of gross hematuria, or dysuria.  He has no family history of prostate or breast cancer.  He has never undergone prostate biopsy previously.  He denies any weight loss or bone pain.   PMH: Past Medical History:  Diagnosis Date  . Abdominal aortic atherosclerosis ([place]) 10/27/2015  . Aortic ectasia, abdominal ([place]) 10/27/2015  . Gout   . Hyperlipidemia   . Hypertension   . Insomnia   Pulmonary embolus  Surgical History: Past Surgical History:  Procedure Laterality Date  . CARPAL TUNNEL RELEASE    . rotator cuff surgery   04/20/2016   right arm; [facility]   . TRIGGER FINGER RELEASE      Allergies:  Allergies  Allergen Reactions  . Keflex [Cephalexin] Rash    Family History: Family History  Problem Relation Age of Onset  . Hypertension Father   . Heart disease Father   . Hypertension Brother   . Hypertension Sister   . Supraventricular tachycardia Daughter   . Heart disease Daughter   . Hypertension Brother   . Hypertension Brother   . Hypertension Brother   . Heart disease Brother     Social History:  reports that he has quit smoking. He has never used smokeless tobacco. He reports current alcohol use of about 7.0 standard drinks of alcohol per week. He reports that he does not use drugs.  ROS: Please see flowsheet from today's date for complete review of systems.  Physical Exam: BP 138/87 (BP Location: Left Arm, Patient  Position: Sitting, Cuff Size: Normal)   Pulse 69   Ht 5\' 9"  (1.753 m)   Wt 182 lb (82.6 kg)   BMI 26.88 kg/m    Constitutional:  Alert and oriented, No acute distress. Cardiovascular: No clubbing, cyanosis, or edema. Respiratory: Normal respiratory effort, no increased work of breathing. GI: Abdomen is soft, nontender, nondistended, no abdominal masses Lymph: No cervical or inguinal lymphadenopathy. Skin: No rashes, bruises or suspicious lesions. Neurologic: Grossly intact, no focal deficits, moving all 4 extremities. Psychiatric: Normal mood and affect.  Laboratory Data: Reviewed, see HPI for PSA history  Pertinent Imaging: None to review  Assessment & Plan:   In summary, the patient is a 69 year old male with recent diagnosis of bilateral pulmonary emboli on Eliquis who was referred for PSA of 1.2 which was felt to be slightly increased from his prior values.  I reviewed his PSA history with him today and provided reassurance that these are all well within the normal range.  We reviewed the AUA guidelines that recommend considering routine screening in men age 50-69, as well as the risks and benefits.  We also discussed that digital rectal exam is phasing out in urology secondary to its low ability to decrease prostate cancer, especially in patients with a PSA less than 3.  He elects to defer DRE today which is very reasonable.  Continue yearly PSA screening up to age 57 with  PCP  A total of 30 minutes were spent face-to-face with the patient, greater than 50% was spent in patient education, counseling, and coordination of care regarding risks and benefits of PSA screening.   [place], [facility] [address], [place], [place] [address] [phone]

## 2019-05-15 NOTE — Patient Instructions (Signed)
Prostate Cancer Screening  Prostate cancer screening is a test that is done to check for the presence of prostate cancer in men. The prostate gland is a walnut-sized gland that is located below the bladder and in front of the rectum in males. The function of the prostate is to add fluid to semen during ejaculation. Prostate cancer is the second most common type of cancer in men. Who should have prostate cancer screening?  Screening recommendations vary based on age and other risk factors. Screening is recommended if:  You are older than age 55. If you are age 55-69, talk with your health care provider about your need for screening and how often screening should be done. Because most prostate cancers are slow growing and will not cause death, screening is generally reserved in this age group for men who have a 10-15-year life expectancy.  You are younger than age 55, and you have these risk factors: ? Being a black male or a male of African descent. ? Having a father, brother, or uncle who has been diagnosed with prostate cancer. The risk is higher if your family member's cancer occurred at an early age. Screening is not recommended if:  You are younger than age 40.  You are between the ages of 40 and 54 and you have no risk factors.  You are 70 years of age or older. At this age, the risks that screening can cause are greater than the benefits that it may provide. If you are at high risk for prostate cancer, your health care provider may recommend that you have screenings more often or that you start screening at a younger age. How is screening for prostate cancer done? The recommended prostate cancer screening test is a blood test called the prostate-specific antigen (PSA) test. PSA is a protein that is made in the prostate. As you age, your prostate naturally produces more PSA. Abnormally high PSA levels may be caused by:  Prostate cancer.  An enlarged prostate that is not caused by cancer  (benign prostatic hyperplasia, BPH). This condition is very common in older men.  A prostate gland infection (prostatitis). Depending on the PSA results, you may need more tests, such as:  A physical exam to check the size of your prostate gland.  Blood and imaging tests.  A procedure to remove tissue samples from your prostate gland for testing (biopsy). What are the benefits of prostate cancer screening?  Screening can help to identify cancer at an early stage, before symptoms start and when the cancer can be treated more easily.  There is a small chance that screening may lower your risk of dying from prostate cancer. The chance is small because prostate cancer is a slow-growing cancer, and most men with prostate cancer die from a different cause. What are the risks of prostate cancer screening? The main risk of prostate cancer screening is diagnosing and treating prostate cancer that would never have caused any symptoms or problems. This is called overdiagnosisand overtreatment. PSA screening cannot tell you if your PSA is high due to cancer or a different cause. A prostate biopsy is the only procedure to diagnose prostate cancer. Even the results of a biopsy may not tell you if your cancer needs to be treated. Slow-growing prostate cancer may not need any treatment other than monitoring, so diagnosing and treating it may cause unnecessary stress or other side effects. A prostate biopsy may also cause:  Infection or fever.  A false negative. This is   a result that shows that you do not have prostate cancer when you actually do have prostate cancer. Questions to ask your health care provider  When should I start prostate cancer screening?  What is my risk for prostate cancer?  How often do I need screening?  What type of screening tests do I need?  How do I get my test results?  What do my results mean?  Do I need treatment? Where to find more information  The American Cancer  Society: www.cancer.org  American Urological Association: www.auanet.org Contact a health care provider if:  You have difficulty urinating.  You have pain when you urinate or ejaculate.  You have blood in your urine or semen.  You have pain in your back or in the area of your prostate. Summary  Prostate cancer is a common type of cancer in men. The prostate gland is located below the bladder and in front of the rectum. This gland adds fluid to semen during ejaculation.  Prostate cancer screening may identify cancer at an early stage, when the cancer can be treated more easily.  The prostate-specific antigen (PSA) test is the recommended screening test for prostate cancer.  Discuss the risks and benefits of prostate cancer screening with your health care provider. If you are age 70 or older, the risks that screening can cause are greater than the benefits that it may provide. This information is not intended to replace advice given to you by your health care provider. Make sure you discuss any questions you have with your health care provider. Document Revised: 11/14/2018 Document Reviewed: 11/14/2018 Elsevier Patient Education  2020 Elsevier Inc.  

## 2019-05-16 ENCOUNTER — Other Ambulatory Visit: Payer: Self-pay

## 2019-05-16 NOTE — Progress Notes (Signed)
Pt called to go over information related to [facility]. Questions and concerns gone over. Pt has no specific questions or concerns at this time.  

## 2019-05-18 ENCOUNTER — Encounter: Payer: Self-pay | Admitting: Family Medicine

## 2019-05-19 ENCOUNTER — Inpatient Hospital Stay: Payer: Medicare PPO | Attending: Oncology | Admitting: Oncology

## 2019-05-19 ENCOUNTER — Other Ambulatory Visit: Payer: Self-pay

## 2019-05-19 ENCOUNTER — Inpatient Hospital Stay: Payer: Medicare PPO

## 2019-05-19 VITALS — BP 140/91 | HR 55 | Temp 97.8°F | Resp 18 | Ht 69.0 in | Wt 183.0 lb

## 2019-05-19 DIAGNOSIS — I2699 Other pulmonary embolism without acute cor pulmonale: Secondary | ICD-10-CM | POA: Diagnosis present

## 2019-05-19 DIAGNOSIS — Z7901 Long term (current) use of anticoagulants: Secondary | ICD-10-CM | POA: Insufficient documentation

## 2019-05-19 DIAGNOSIS — E785 Hyperlipidemia, unspecified: Secondary | ICD-10-CM | POA: Diagnosis not present

## 2019-05-19 DIAGNOSIS — I10 Essential (primary) hypertension: Secondary | ICD-10-CM | POA: Diagnosis not present

## 2019-05-19 DIAGNOSIS — I2510 Atherosclerotic heart disease of native coronary artery without angina pectoris: Secondary | ICD-10-CM | POA: Diagnosis not present

## 2019-05-19 DIAGNOSIS — I700 Atherosclerosis of aorta: Secondary | ICD-10-CM | POA: Insufficient documentation

## 2019-05-19 DIAGNOSIS — R911 Solitary pulmonary nodule: Secondary | ICD-10-CM

## 2019-05-19 LAB — ANTITHROMBIN III: AntiThromb III Func: 97 % (ref 75–120)

## 2019-05-19 MED ORDER — APIXABAN 5 MG PO TABS: 5.0000 mg | ORAL_TABLET | Freq: Two times a day (BID) | ORAL | 0 refills | Status: DC

## 2019-05-19 NOTE — Addendum Note (Signed)
Addended by: Mila Merry on: 05/19/2019 10:38 AM   Modules accepted: Orders

## 2019-05-19 NOTE — Progress Notes (Signed)
[facility]  [phone] Fax:(336) [phone]  ID: Victor Knight OB: 04/03/1951  MR#: [id number]  [id number]  Patient Care Team: Delsa Grana, Hershal Coria as PCP - General (Family Medicine) Ralene Bathe, MD (Dermatology) Estill Cotta, MD (Ophthalmology)  CHIEF COMPLAINT: Bilateral pulmonary embolism.  INTERVAL HISTORY: Patient is a 69 year old male who presented to the emergency room in early January with pleuritic right-sided chest pain.  Subsequent work-up revealed bilateral pulmonary embolism with small lung infarct.  He was placed on Eliquis and did not require admission to the hospital.  He reports no transient risk factors such as extended travel or surgeries.  Patient currently feels well and no longer complains of chest pain or shortness of breath.  He has no neurologic complaints.  He denies any recent fevers or illnesses.  He has a good appetite and denies weight loss.  He denies any hemoptysis or cough.  He has no nausea, vomiting, constipation, or diarrhea.  He has no urinary complaints.  Patient feels at his baseline offers no specific complaints today.  REVIEW OF SYSTEMS:   Review of Systems  Constitutional: Negative.  Negative for fever, malaise/fatigue and weight loss.  Respiratory: Negative.  Negative for cough, hemoptysis and shortness of breath.   Cardiovascular: Negative.  Negative for chest pain and leg swelling.  Gastrointestinal: Negative.  Negative for abdominal pain.  Genitourinary: Negative.  Negative for dysuria.  Musculoskeletal: Negative.  Negative for back pain.  Skin: Negative.  Negative for rash.  Neurological: Negative.  Negative for dizziness, focal weakness, weakness and headaches.  Endo/Heme/Allergies: Does not bruise/bleed easily.  Psychiatric/Behavioral: Negative.  The patient is not nervous/anxious.     As per HPI. Otherwise, a complete review of systems is negative.  PAST MEDICAL HISTORY: Past Medical  History:  Diagnosis Date  . Abdominal aortic atherosclerosis ([place]) 10/27/2015  . Aortic ectasia, abdominal ([place]) 10/27/2015  . Gout   . Hyperlipidemia   . Hypertension   . Insomnia     PAST SURGICAL HISTORY: Past Surgical History:  Procedure Laterality Date  . CARPAL TUNNEL RELEASE    . rotator cuff surgery   04/20/2016   right arm; [facility]   . TRIGGER FINGER RELEASE      FAMILY HISTORY: Family History  Problem Relation Age of Onset  . Hypertension Father   . Heart disease Father   . Hypertension Brother   . Hypertension Sister   . Supraventricular tachycardia Daughter   . Heart disease Daughter   . Hypertension Brother   . Hypertension Brother   . Hypertension Brother   . Heart disease Brother     ADVANCED DIRECTIVES (Y/N):  N  HEALTH MAINTENANCE: Social History   Tobacco Use  . Smoking status: Former Research scientist (life sciences)  . Smokeless tobacco: Never Used  Substance Use Topics  . Alcohol use: Yes    Alcohol/week: 7.0 standard drinks    Types: 7 Glasses of wine per week  . Drug use: No     Colonoscopy:  PAP:  Bone density:  Lipid panel:  Allergies  Allergen Reactions  . Keflex [Cephalexin] Rash    Current Outpatient Medications  Medication Sig Dispense Refill  . allopurinol (ZYLOPRIM) 100 MG tablet Take 1 tablet (100 mg total) by mouth daily. 90 tablet 3  . apixaban (ELIQUIS) 5 MG TABS tablet Take 2 tablets (10 mg total) by mouth 2 (two) times daily. 2 tablets (10 mg) 2 times a day day for 7 days (until 1/18), then 1 tablet (5 mg) 2  times a day. 60 tablet 3  . atenolol (TENORMIN) 100 MG tablet Take 1 tablet (100 mg total) by mouth daily. 90 tablet 3  . ezetimibe (ZETIA) 10 MG tablet Take 1 tablet (10 mg total) by mouth daily. 90 tablet 1  . LUMIGAN 0.01 % SOLN Place 1 drop into the right eye at bedtime.   0  . Multiple Vitamin (MULTIVITAMIN) tablet Take 1 tablet by mouth daily.    . rosuvastatin (CRESTOR) 40 MG tablet Take 1 tablet (40 mg total) by mouth daily. 90  tablet 3  . traZODone (DESYREL) 100 MG tablet TAKE 1/2 TO 1 TABLET BY MOUTH AT BEDTIME AS NEEDED FOR SLEEP 90 tablet 3   No current facility-administered medications for this visit.    OBJECTIVE: Vitals:   05/19/19 0856  BP: (!) 140/91  Pulse: (!) 55  Resp: 18  Temp: 97.8 F (36.6 C)  SpO2: 99%     Body mass index is 27.02 kg/m.    ECOG FS:0 - Asymptomatic  General: Well-developed, well-nourished, no acute distress. Eyes: Pink conjunctiva, anicteric sclera. HEENT: Normocephalic, moist mucous membranes. Lungs: No audible wheezing or coughing. Heart: Regular rate and rhythm. Abdomen: Soft, nontender, no obvious distention. Musculoskeletal: No edema, cyanosis, or clubbing. Neuro: Alert, answering all questions appropriately. Cranial nerves grossly intact. Skin: No rashes or petechiae noted. Psych: Normal affect. Lymphatics: No cervical, calvicular, axillary or inguinal LAD.   LAB RESULTS:  Lab Results  Component Value Date   NA 140 05/09/2019   K 4.4 05/09/2019   CL 107 05/09/2019   CO2 25 05/09/2019   GLUCOSE 112 (H) 05/09/2019   BUN 16 05/09/2019   CREATININE 1.06 05/09/2019   CALCIUM 9.4 05/09/2019   PROT 6.6 05/09/2019   ALBUMIN 3.9 04/28/2019   AST 56 (H) 05/09/2019   ALT 78 (H) 05/09/2019   ALKPHOS 39 04/28/2019   BILITOT 0.4 05/09/2019   GFRNONAA 72 05/09/2019   GFRAA 83 05/09/2019    Lab Results  Component Value Date   WBC 5.7 05/09/2019   NEUTROABS 3,215 05/09/2019   HGB 14.9 05/09/2019   HCT 43.9 05/09/2019   MCV 89.6 05/09/2019   PLT 216 05/09/2019     STUDIES: CT Angio Chest PE W/Cm &/Or Wo Cm  Result Date: 04/28/2019 CLINICAL DATA:  Right-sided chest pain EXAM: CT ANGIOGRAPHY CHEST WITH CONTRAST TECHNIQUE: Multidetector CT imaging of the chest was performed using the standard protocol during bolus administration of intravenous contrast. Multiplanar CT image reconstructions and MIPs were obtained to evaluate the vascular anatomy. CONTRAST:   21mL OMNIPAQUE IOHEXOL 350 MG/ML SOLN COMPARISON:  None. FINDINGS: Cardiovascular: Bilateral central and branching pulmonary artery filling defects, subsegmental on the left and up to segmental on the right. Most proximal clot is seen in the anterior basal segment of the right lower lobe where there is occlusive clot and lung infarct appearance. The emboli are marked on series 5. There is insufficient clot burden to implicate RV strain. Coronary atherosclerosis in both the left and right circulation. Aortic atherosclerosis. Mediastinum/Nodes: Negative for adenopathy or mass. Lungs/Pleura: Wedge of airspace disease in the subpleural right lower lobe with mild dependent atelectasis on both sides. Subpleural nodule along the lower right major fissure measuring 5 mm average diameter. No pulmonary edema. Upper Abdomen: Partially covered left renal cystic density. Musculoskeletal: Spondylosis Critical Value/emergent results were called by telephone at the time of interpretation on 04/28/2019 at 4:40 am to providerJADE SUNG , who verbally acknowledged these results. Review of the MIP images  confirms the above findings. IMPRESSION: 1. Acute bilateral pulmonary emboli that are segmental to subsegmental in size. A small lung infarct is seen in the right lower lobe. 2. 5 mm subpleural nodule along the lower right major fissure, likely lymph node. No follow-up needed if patient is low-risk. Non-contrast chest CT can be considered in 12 months if patient is high-risk. This recommendation follows the consensus statement: Guidelines for Management of Incidental Pulmonary Nodules Detected on CT Images: From the Fleischner Society 2017; Radiology 2017; 284:228-243. 3.  Aortic Atherosclerosis (ICD10-I70.0).  Coronary atherosclerosis. Electronically Signed   By: Monte Fantasia M.D.   On: 04/28/2019 04:42   US Venous Img Lower Bilateral (DVT)  Result Date: 04/28/2019 CLINICAL DATA:  Pulmonary embolism.  Evaluate for DVT. EXAM:  BILATERAL LOWER EXTREMITY VENOUS DOPPLER ULTRASOUND TECHNIQUE: Gray-scale sonography with graded compression, as well as color Doppler and duplex ultrasound were performed to evaluate the lower extremity deep venous systems from the level of the common femoral vein and including the common femoral, femoral, profunda femoral, popliteal and calf veins including the posterior tibial, peroneal and gastrocnemius veins when visible. The superficial great saphenous vein was also interrogated. Spectral Doppler was utilized to evaluate flow at rest and with distal augmentation maneuvers in the common femoral, femoral and popliteal veins. COMPARISON:  Chest CTA - earlier same day FINDINGS: RIGHT LOWER EXTREMITY Common Femoral Vein: No evidence of thrombus. Normal compressibility, respiratory phasicity and response to augmentation. Saphenofemoral Junction: No evidence of thrombus. Normal compressibility and flow on color Doppler imaging. Profunda Femoral Vein: No evidence of thrombus. Normal compressibility and flow on color Doppler imaging. Femoral Vein: No evidence of thrombus. Normal compressibility, respiratory phasicity and response to augmentation. Popliteal Vein: No evidence of thrombus. Normal compressibility, respiratory phasicity and response to augmentation. Calf Veins: No evidence of thrombus. Normal compressibility and flow on color Doppler imaging. Superficial Great Saphenous Vein: No evidence of thrombus. Normal compressibility. Venous Reflux:  None. Other Findings:  None. LEFT LOWER EXTREMITY Common Femoral Vein: No evidence of thrombus. Normal compressibility, respiratory phasicity and response to augmentation. Saphenofemoral Junction: No evidence of thrombus. Normal compressibility and flow on color Doppler imaging. Profunda Femoral Vein: No evidence of thrombus. Normal compressibility and flow on color Doppler imaging. Femoral Vein: No evidence of thrombus. Normal compressibility, respiratory phasicity and  response to augmentation. Popliteal Vein: No evidence of thrombus. Normal compressibility, respiratory phasicity and response to augmentation. Calf Veins: No evidence of thrombus. Normal compressibility and flow on color Doppler imaging. Superficial Great Saphenous Vein: No evidence of thrombus. Normal compressibility. Venous Reflux:  None. Other Findings:  None. IMPRESSION: No evidence of DVT within either lower extremity. Electronically Signed   By: Sandi Mariscal M.D.   On: 04/28/2019 15:47   US Aorta  Result Date: 04/28/2019 CLINICAL DATA:  Right upper quadrant pain EXAM: ULTRASOUND OF ABDOMINAL AORTA TECHNIQUE: Ultrasound examination of the abdominal aorta and proximal common iliac arteries was performed to evaluate for aneurysm. Additional color and Doppler images of the distal aorta were obtained to document patency. COMPARISON:  October 26, 2015 FINDINGS: Abdominal aortic measurements as follows: Proximal:  2.2 x 2.8 cm Mid:  1.7 x 1.9 cm Distal:  1.9 x 2.0 cm.  Distal aorta is slightly ectatic. Patent: Yes, peak systolic velocity is [id number] cm/s Right common iliac artery: 1.2 x 1.6 cm Left common iliac artery: 1.4 x 1.3 cm IMPRESSION: Slightly ectatic distal aorta without evidence of aneurysm. This does not appear to be significantly changed since the prior  exam of 2017. Electronically Signed   By: Prudencio Pair M.D.   On: 04/28/2019 03:48   DG Chest Port 1 View  Result Date: 04/28/2019 CLINICAL DATA:  Chest pain EXAM: PORTABLE CHEST 1 VIEW COMPARISON:  04/28/2019 at 2:15 a.m. FINDINGS: The heart size and mediastinal contours are within normal limits. Both lungs are clear. The visualized skeletal structures are unremarkable. IMPRESSION: No active disease. Electronically Signed   By: Ulyses Jarred M.D.   On: 04/28/2019 02:41   US Abdomen Limited RUQ  Result Date: 04/28/2019 CLINICAL DATA:  Right upper quadrant pain EXAM: ULTRASOUND ABDOMEN LIMITED RIGHT UPPER QUADRANT COMPARISON:  None. FINDINGS:  Gallbladder: No gallstones or wall thickening visualized. No sonographic Murphy sign noted by sonographer. Common bile duct: Diameter: 4.0 mm Liver: No focal lesion identified. Within normal limits in parenchymal echogenicity. Portal vein is patent on color Doppler imaging with normal direction of blood flow towards the liver. Other: None. IMPRESSION: Normal examination. Electronically Signed   By: Prudencio Pair M.D.   On: 04/28/2019 03:49    ASSESSMENT: Bilateral pulmonary embolism.  PLAN:    1.  Bilateral pulmonary embolism: Patient has no personal or family history of blood clots.  He has no apparent transient risk factors such as recent surgery or travel.  He does not lead a sedentary lifestyle and remains active.  Will initiate full hypercoagulable work-up today.  Patient will require a minimum of 6 months of anticoagulation through July 2021.  Patient will have follow-up video assisted telemedicine visit in approximately 1 month to discuss the results and to determine if extended anticoagulation is needed.  Patient expressed understanding and was in agreement with this plan. He also understands that He can call clinic at any time with any questions, concerns, or complaints.   I spent a total of 45 minutes reviewing chart data, face-to-face evaluation with the patient, counseling and coordination of care as detailed above.    Lloyd Huger, MD   05/19/2019 10:07 AM

## 2019-05-20 LAB — LUPUS ANTICOAGULANT PANEL
DRVVT: 42.2 s (ref 0.0–47.0)
PTT Lupus Anticoagulant: 29.4 s (ref 0.0–51.9)

## 2019-05-20 LAB — PROTEIN C ACTIVITY: Protein C Activity: 124 % (ref 73–180)

## 2019-05-20 LAB — HOMOCYSTEINE: Homocysteine: 9.2 umol/L (ref 0.0–17.2)

## 2019-05-20 LAB — PROTEIN S ACTIVITY: Protein S Activity: 123 % (ref 63–140)

## 2019-05-20 LAB — PROTEIN S, TOTAL: Protein S Ag, Total: 93 % (ref 60–150)

## 2019-05-21 LAB — CARDIOLIPIN ANTIBODIES, IGG, IGM, IGA
Anticardiolipin IgA: <9 APL U/mL (ref 0–11)
Anticardiolipin IgG: <9 GPL U/mL (ref 0–14)
Anticardiolipin IgM: <9 MPL U/mL (ref 0–12)

## 2019-05-21 LAB — BETA-2-GLYCOPROTEIN I ABS, IGG/M/A
Beta-2 Glyco I IgG: <9 GPI IgG units (ref 0–20)
Beta-2-Glycoprotein I IgA: <9 GPI IgA units (ref 0–25)
Beta-2-Glycoprotein I IgM: <9 GPI IgM units (ref 0–32)

## 2019-05-21 LAB — PROTEIN C, TOTAL: Protein C, Total: 89 % (ref 60–150)

## 2019-05-21 NOTE — Progress Notes (Signed)
This is a guy we discussed in the past about PSA changes and recommended follow-up.  His PSA 0.8 increased to 1.2 and we chatted and I asked Sowles - she recommended urology eval for 50% increase in PSA  Urology was not concerned at all   "In summary, the patient is a 69 year old male with recent diagnosis of bilateral pulmonary emboli on Eliquis who was referred for PSA of 1.2 which was felt to be slightly increased from his prior values.  I reviewed his PSA history with him today and provided reassurance that these are all well within the normal range.  We reviewed the AUA guidelines that recommend considering routine screening in men age 87-69, as well as the risks and benefits.  We also discussed that digital rectal exam is phasing out in urology secondary to its low ability to decrease prostate cancer, especially in patients with a PSA less than 3.  He elects to defer DRE today which is very reasonable.   Continue yearly PSA screening up to age 20 with PCP"

## 2019-05-22 ENCOUNTER — Other Ambulatory Visit: Payer: Self-pay

## 2019-05-22 ENCOUNTER — Encounter: Payer: Self-pay | Admitting: Family Medicine

## 2019-05-22 ENCOUNTER — Ambulatory Visit: Payer: Medicare PPO | Admitting: Family Medicine

## 2019-05-22 VITALS — BP 124/62 | HR 65 | Temp 98.3°F | Resp 14 | Ht 69.0 in | Wt 184.6 lb

## 2019-05-22 DIAGNOSIS — R7989 Other specified abnormal findings of blood chemistry: Secondary | ICD-10-CM | POA: Diagnosis not present

## 2019-05-22 DIAGNOSIS — Z7901 Long term (current) use of anticoagulants: Secondary | ICD-10-CM

## 2019-05-22 DIAGNOSIS — R791 Abnormal coagulation profile: Secondary | ICD-10-CM

## 2019-05-22 LAB — FACTOR 5 LEIDEN

## 2019-05-22 LAB — PROTHROMBIN GENE MUTATION

## 2019-05-22 NOTE — Progress Notes (Signed)
Patient ID: Victor Knight, male    DOB: [birth year], 69 y.o.   MRN: [id number]  PCP: Delsa Grana, PA-C  Chief Complaint  Patient presents with  . Follow-up    abnormal liver function labs    Subjective:   Victor Knight is a 69 y.o. male, presents to clinic with CC of the following:  Presents for f/up on elevated LFTs 04/28/2019 AST/ALT 40 and 50 05/09/2019 AST/ALT 56/78  No recent travel  Hx of Hep C treated with saw Dr. Allen Norris treated and cleared 2 years ago  No tylenol use ETOH wine at night - a glass no other [place] New meds eliquis No abd pain no GI sx   Patient Active Problem List   Diagnosis Date Noted  . Bilateral pulmonary embolism ([place]) 04/28/2019  . Plantar fasciitis of left foot 03/31/2019  . Medicare annual wellness visit, subsequent 07/03/2017  . Tendinitis of shoulder 04/09/2017  . Bursitis of shoulder 04/09/2017  . Shoulder pain 04/09/2017  . Adhesive capsulitis of shoulder 04/09/2017  . Dupuytren contracture 04/09/2017  . Complete tear of right rotator cuff 04/20/2016  . Preventative health care 12/05/2015  . Medication monitoring encounter 11/02/2015  . Abdominal aortic atherosclerosis ([place]) 10/27/2015  . Aortic ectasia, abdominal ([place]) 10/27/2015  . Hx of smoking 10/05/2015  . History of hepatitis C 10/05/2015  . Prostate cancer screening 10/05/2015  . Disease of liver 09/29/2015  . Hypertension 10/05/2014  . Insomnia 10/05/2014  . Hyperlipidemia LDL goal <70 10/05/2014  . Gout 10/05/2014      Current Outpatient Medications:  .  allopurinol (ZYLOPRIM) 100 MG tablet, Take 1 tablet (100 mg total) by mouth daily., Disp: 90 tablet, Rfl: 3 .  apixaban (ELIQUIS) 5 MG TABS tablet, Take 1 tablet (5 mg total) by mouth 2 (two) times daily., Disp: 180 tablet, Rfl: 0 .  atenolol (TENORMIN) 100 MG tablet, Take 1 tablet (100 mg total) by mouth daily., Disp: 90 tablet, Rfl: 3 .  ezetimibe (ZETIA) 10 MG tablet, Take 1 tablet (10 mg total) by mouth  daily., Disp: 90 tablet, Rfl: 1 .  LUMIGAN 0.01 % SOLN, Place 1 drop into the right eye at bedtime. , Disp: , Rfl: 0 .  Multiple Vitamin (MULTIVITAMIN) tablet, Take 1 tablet by mouth daily., Disp: , Rfl:  .  rosuvastatin (CRESTOR) 40 MG tablet, Take 1 tablet (40 mg total) by mouth daily., Disp: 90 tablet, Rfl: 3 .  traZODone (DESYREL) 100 MG tablet, TAKE 1/2 TO 1 TABLET BY MOUTH AT BEDTIME AS NEEDED FOR SLEEP, Disp: 90 tablet, Rfl: 3   Allergies  Allergen Reactions  . Keflex [Cephalexin] Rash     Family History  Problem Relation Age of Onset  . Hypertension Father   . Heart disease Father   . Hypertension Brother   . Hypertension Sister   . Supraventricular tachycardia Daughter   . Heart disease Daughter   . Hypertension Brother   . Hypertension Brother   . Hypertension Brother   . Heart disease Brother      Social History   Socioeconomic History  . Marital status: Married    Spouse name: [name]  . Number of children: 1  . Years of education: Not on file  . Highest education level: High school graduate  Occupational History  . Occupation: Retired  Tobacco Use  . Smoking status: Former Research scientist (life sciences)  . Smokeless tobacco: Never Used  Substance and Sexual Activity  . Alcohol use: Yes    Alcohol/week: 7.0  standard drinks    Types: 7 Glasses of wine per week  . Drug use: No  . Sexual activity: Yes    Partners: Female  Other Topics Concern  . Not on file  Social History Narrative  . Not on file   Social Determinants of Health   Financial Resource Strain:   . Difficulty of Paying Living Expenses: Not on file  Food Insecurity:   . Worried About Charity fundraiser in the Last Year: Not on file  . Ran Out of Food in the Last Year: Not on file  Transportation Needs:   . Lack of Transportation (Medical): Not on file  . Lack of Transportation (Non-Medical): Not on file  Physical Activity: Inactive  . Days of Exercise per Week: 0 days  . Minutes of Exercise per Session:  0 min  Stress:   . Feeling of Stress : Not on file  Social Connections:   . Frequency of Communication with Friends and Family: Not on file  . Frequency of Social Gatherings with Friends and Family: Not on file  . Attends Religious Services: Not on file  . Active Member of Clubs or Organizations: Not on file  . Attends Archivist Meetings: Not on file  . Marital Status: Not on file  Intimate Partner Violence:   . Fear of Current or Ex-Partner: Not on file  . Emotionally Abused: Not on file  . Physically Abused: Not on file  . Sexually Abused: Not on file    Chart Review Today: I personally reviewed active problem list, medication list, allergies, family history, social history, health maintenance, notes from last encounter, lab results, imaging with the patient/caregiver today.  Review of Systems  Constitutional: Negative.   HENT: Negative.   Eyes: Negative.   Respiratory: Negative.   Cardiovascular: Negative.   Gastrointestinal: Negative.   Endocrine: Negative.   Genitourinary: Negative.   Musculoskeletal: Negative.   Skin: Negative.   Allergic/Immunologic: Negative.   Neurological: Negative.   Hematological: Negative.   Psychiatric/Behavioral: Negative.   All other systems reviewed and are negative.      Objective:   Vitals:   05/22/19 1122  BP: 124/62  Pulse: 65  Resp: 14  Temp: 98.3 F (36.8 C)  SpO2: 96%  Weight: 184 lb 9.6 oz (83.7 kg)  Height: 5\' 9"  (1.753 m)    Body mass index is 27.26 kg/m.  Physical Exam Vitals and nursing note reviewed.  Constitutional:      General: He is not in acute distress.    Appearance: Normal appearance. He is well-developed. He is not ill-appearing, toxic-appearing or diaphoretic.     Interventions: Face mask in place.  HENT:     Head: Normocephalic and atraumatic.     Jaw: No trismus.     Right Ear: External ear normal.     Left Ear: External ear normal.  Eyes:     General: Lids are normal. No scleral  icterus.    Conjunctiva/sclera: Conjunctivae normal.     Pupils: Pupils are equal, round, and reactive to light.  Neck:     Trachea: Trachea and phonation normal. No tracheal deviation.  Cardiovascular:     Rate and Rhythm: Normal rate and regular rhythm.     Pulses: Normal pulses.          Radial pulses are 2+ on the right side and 2+ on the left side.       Posterior tibial pulses are 2+ on the right side and  2+ on the left side.     Heart sounds: Normal heart sounds. No murmur. No friction rub. No gallop.   Pulmonary:     Effort: Pulmonary effort is normal. No respiratory distress.     Breath sounds: Normal breath sounds. No stridor. No wheezing, rhonchi or rales.  Abdominal:     General: Bowel sounds are normal. There is no distension.     Palpations: Abdomen is soft.     Tenderness: There is no abdominal tenderness. There is no guarding or rebound.  Musculoskeletal:        General: Normal range of motion.     Cervical back: Normal range of motion and neck supple.     Right lower leg: No edema.     Left lower leg: No edema.  Skin:    General: Skin is warm and dry.     Capillary Refill: Capillary refill takes less than 2 seconds.     Coloration: Skin is not jaundiced or pale.     Findings: No rash.     Nails: There is no clubbing.  Neurological:     Mental Status: He is alert.     Cranial Nerves: No dysarthria or facial asymmetry.     Motor: No tremor or abnormal muscle tone.     Gait: Gait normal.  Psychiatric:        Mood and Affect: Mood normal.        Speech: Speech normal.        Behavior: Behavior normal. Behavior is cooperative.      Results for orders placed or performed in visit on 05/19/19  Homocysteine  Result Value Ref Range   Homocysteine 9.2 0.0 - 17.2 umol/L  Beta-2-glycoprotein i abs, IgG/M/A  Result Value Ref Range   Beta-2 Glyco I IgG <9 0 - 20 GPI IgG units   Beta-2-Glycoprotein I IgM <9 0 - 32 GPI IgM units   Beta-2-Glycoprotein I IgA <9 0 - 25  GPI IgA units  Protein S, total  Result Value Ref Range   Protein S Ag, Total 93 60 - 150 %  Antithrombin III  Result Value Ref Range   AntiThromb III Func 97 75 - 120 %  Protein S activity  Result Value Ref Range   Protein S Activity 123 63 - 140 %  Protein C, total  Result Value Ref Range   Protein C, Total 89 60 - 150 %  Protein C activity  Result Value Ref Range   Protein C Activity 124 73 - 180 %  Lupus anticoagulant panel  Result Value Ref Range   PTT Lupus Anticoagulant 29.4 0.0 - 51.9 sec   DRVVT 42.2 0.0 - 47.0 sec   Lupus Anticoag Interp Comment:   Cardiolipin antibodies, IgG, IgM, IgA  Result Value Ref Range   Anticardiolipin IgG <9 0 - 14 GPL U/mL   Anticardiolipin IgM <9 0 - 12 MPL U/mL   Anticardiolipin IgA <9 0 - 11 APL U/mL        Assessment & Plan:      ICD-10-CM   1. Elevated LFTs  [id number] COMPLETE METABOLIC PANEL WITH GFR    Lipid panel    Hepatitis panel, acute    Iron, TIBC and Ferritin Panel    Protime-INR    PTT  2. Anticoagulated by anticoagulation treatment  Z79.01 Protime-INR    PTT  3. Abnormal partial thromboplastin time (PTT)  R79.1 Protime-INR    PTT    No obvious cause  of slightly elevated liver enzyme tests may be secondary to Eliquis use in setting of prior hep C infection, patient drinks wine every night encouraged him to decrease gradually -but he states he is only having 1 glass of wine a night.  When in the hospital he did have right upper quadrant ultrasound which was unremarkable did not show any fatty liver disease. He just consulted with hematology did extensive work-up his CBC has been normal in the past month  Of note while he is in the hospital he was elevated PTT lab w/o any repeated tests afterwards.    Patient does not want to do any blood work today because he just had labs done a few days ago.  I explained work-up to rule out other viral causes, would like to recheck a hepatitis panel, cholesterol panel and recheck  basic chemistry and liver function tests.  Patient wishes not to do that right now and he will follow up later.  He is established with gastroenterology and I did explain that we can monitor and if LFTs remain elevated or worsen he could follow-up with GI    Delsa Grana, PA-C 05/22/19 11:36 AM

## 2019-05-26 ENCOUNTER — Other Ambulatory Visit: Payer: Self-pay | Admitting: Oncology

## 2019-05-26 DIAGNOSIS — R911 Solitary pulmonary nodule: Secondary | ICD-10-CM

## 2019-05-26 NOTE — Progress Notes (Signed)
    Pulmonary Nodule Clinic Telephone Note  Received referral from Dr. Grayland Ormond.  HPI: Patient was recently evaluated in the emergency room for pleuritic right chest pain and found to have bilateral pulmonary embolus with small lung infarct.  He was started on Eliquis and did not require hospital stay.  Imaging with CT angio chest PE protocol revealed acute bilateral pulmonary emboli and a 5 mm subpleural nodule.  Review/recommendations:  I personally reviewed all patient's previous imaging including most recent CT angio chest PE revealing 5 mm subpleural nodule along the lower right major fissure likely a lymph node.  I recommend follow-up with noncontrasted CT scan of the chest in approximately 12 months given he is a former smoker.  Social history: Former smoker.  Unclear of quit date or packs per day.  High risk factors include: History of heavy smoking, exposure to asbestos, radium or uranium, personal family history of lung cancer, older age, sex (females greater than males), race (black and native [place] greater than weight), marginal speculation, upper lobe location, multiplicity (less than 5 nodules increases risk for malignancy) and emphysema and/or pulmonary fibrosis.   This recommendation follows the consensus statement: Guidelines for Management of Incidental Pulmonary Nodules Detected on CT Images: From the Fleischner Society 2017; Radiology 2017; 284:228-243.     I have placed order for CT scan without contrast to be completed approximately 12 months from previous.  Approximately January 2022.  Disposition: Order placed for repeat CT chest. Will notify Lenox Ponds in scheduling. Hayley Rhode to call patient with appointment date and time. Return to pulmonary nodule clinic a few days after his repeat imaging to discuss results and plan moving forward.  Faythe Casa, NP 05/26/2019 3:14 PM

## 2019-05-28 ENCOUNTER — Encounter: Payer: Self-pay | Admitting: Family Medicine

## 2019-05-28 NOTE — Patient Instructions (Signed)
Most common causes of abnormal liver tests are fatty liver disease from diet/high cholesterol, alcohol use and here is a list of other drugs/prescriptions that can cause.  I do want to recheck your labs, please come by the clinic to do that when you can!

## 2019-06-02 ENCOUNTER — Institutional Professional Consult (permissible substitution): Payer: Medicare PPO | Admitting: Pulmonary Disease

## 2019-06-03 ENCOUNTER — Telehealth: Payer: Medicare PPO | Admitting: Oncology

## 2019-06-14 NOTE — Progress Notes (Signed)
[facility]  [phone] [phone]  ID: Victor Knight OB: Oct 23, 1950  MR#: [id number]  [id number]  Patient Care Team: Delsa Grana, Hershal Coria as PCP - General (Family Medicine) Ralene Bathe, MD (Dermatology) Dingeldein, Remo Lipps, MD (Ophthalmology)  I connected with Victor Knight on 06/17/19 at  2:15 PM EST by video enabled telemedicine visit and verified that I am speaking with the correct person using two identifiers.   I discussed the limitations, risks, security and privacy concerns of performing an evaluation and management service by telemedicine and the availability of in-person appointments. I also discussed with the patient that there may be a patient responsible charge related to this service. The patient expressed understanding and agreed to proceed.   Other persons participating in the visit and their role in the encounter: Patient, MD.  Patient's location: Home. Provider's location: Clinic.  CHIEF COMPLAINT: Bilateral pulmonary embolism.  INTERVAL HISTORY: Patient agreed to video enabled telemedicine visit for further evaluation and discussion of his laboratory work.  He currently feels well and is asymptomatic.  He is tolerating Eliquis without significant side effects.   He has no neurologic complaints.  He denies any recent fevers or illnesses.  He has a good appetite and denies weight loss.  He denies any chest pain, shortness of breath, cough, or hemoptysis.  He has no nausea, vomiting, constipation, or diarrhea.  He has no urinary complaints.  Patient feels at his baseline offers no specific complaints today.  REVIEW OF SYSTEMS:   Review of Systems  Constitutional: Negative.  Negative for fever, malaise/fatigue and weight loss.  Respiratory: Negative.  Negative for cough, hemoptysis and shortness of breath.   Cardiovascular: Negative.  Negative for chest pain and leg swelling.  Gastrointestinal: Negative.  Negative for  abdominal pain.  Genitourinary: Negative.  Negative for dysuria.  Musculoskeletal: Negative.  Negative for back pain.  Skin: Negative.  Negative for rash.  Neurological: Negative.  Negative for dizziness, focal weakness, weakness and headaches.  Endo/Heme/Allergies: Does not bruise/bleed easily.  Psychiatric/Behavioral: Negative.  The patient is not nervous/anxious.     As per HPI. Otherwise, a complete review of systems is negative.  PAST MEDICAL HISTORY: Past Medical History:  Diagnosis Date  . Abdominal aortic atherosclerosis ([place]) 10/27/2015  . Aortic ectasia, abdominal ([place]) 10/27/2015  . Gout   . Hyperlipidemia   . Hypertension   . Insomnia     PAST SURGICAL HISTORY: Past Surgical History:  Procedure Laterality Date  . CARPAL TUNNEL RELEASE    . rotator cuff surgery   04/20/2016   right arm; [facility]   . TRIGGER FINGER RELEASE      FAMILY HISTORY: Family History  Problem Relation Age of Onset  . Hypertension Father   . Heart disease Father   . Hypertension Brother   . Hypertension Sister   . Supraventricular tachycardia Daughter   . Heart disease Daughter   . Hypertension Brother   . Hypertension Brother   . Hypertension Brother   . Heart disease Brother     ADVANCED DIRECTIVES (Y/N):  N  HEALTH MAINTENANCE: Social History   Tobacco Use  . Smoking status: Former Research scientist (life sciences)  . Smokeless tobacco: Never Used  Substance Use Topics  . Alcohol use: Yes    Alcohol/week: 7.0 standard drinks    Types: 7 Glasses of wine per week  . Drug use: No     Colonoscopy:  PAP:  Bone density:  Lipid panel:  Allergies  Allergen Reactions  .  Keflex [Cephalexin] Rash    Current Outpatient Medications  Medication Sig Dispense Refill  . allopurinol (ZYLOPRIM) 100 MG tablet Take 1 tablet (100 mg total) by mouth daily. 90 tablet 3  . apixaban (ELIQUIS) 5 MG TABS tablet Take 1 tablet (5 mg total) by mouth 2 (two) times daily. 180 tablet 0  . atenolol (TENORMIN) 100 MG tablet  Take 1 tablet (100 mg total) by mouth daily. 90 tablet 3  . ezetimibe (ZETIA) 10 MG tablet Take 1 tablet (10 mg total) by mouth daily. 90 tablet 1  . LUMIGAN 0.01 % SOLN Place 1 drop into the right eye at bedtime.   0  . Multiple Vitamin (MULTIVITAMIN) tablet Take 1 tablet by mouth daily.    . rosuvastatin (CRESTOR) 40 MG tablet Take 1 tablet (40 mg total) by mouth daily. 90 tablet 3  . traZODone (DESYREL) 100 MG tablet TAKE 1/2 TO 1 TABLET BY MOUTH AT BEDTIME AS NEEDED FOR SLEEP 90 tablet 3   No current facility-administered medications for this visit.    OBJECTIVE: There were no vitals filed for this visit.   There is no height or weight on file to calculate BMI.    ECOG FS:0 - Asymptomatic  General: Well-developed, well-nourished, no acute distress. HEENT: Normocephalic. Neuro: Alert, answering all questions appropriately. Cranial nerves grossly intact. Psych: Normal affect.   LAB RESULTS:  Lab Results  Component Value Date   NA 140 05/09/2019   K 4.4 05/09/2019   CL 107 05/09/2019   CO2 25 05/09/2019   GLUCOSE 112 (H) 05/09/2019   BUN 16 05/09/2019   CREATININE 1.06 05/09/2019   CALCIUM 9.4 05/09/2019   PROT 6.6 05/09/2019   ALBUMIN 3.9 04/28/2019   AST 56 (H) 05/09/2019   ALT 78 (H) 05/09/2019   ALKPHOS 39 04/28/2019   BILITOT 0.4 05/09/2019   GFRNONAA 72 05/09/2019   GFRAA 83 05/09/2019    Lab Results  Component Value Date   WBC 5.7 05/09/2019   NEUTROABS 3,215 05/09/2019   HGB 14.9 05/09/2019   HCT 43.9 05/09/2019   MCV 89.6 05/09/2019   PLT 216 05/09/2019     STUDIES: No results found.  ASSESSMENT: Bilateral pulmonary embolism.  PLAN:    1.  Bilateral pulmonary embolism: Patient has no personal or family history of blood clots.  He had no apparent transient risk factors such as recent surgery or travel.  He does not lead a sedentary lifestyle and remains active.  Full hypercoagulable work-up was either negative or within normal limits.  Patient has  no obvious discernible reason for his pulmonary embolism.  Patient will require a minimum of 6 months of anticoagulation completing treatment in July 2021.  He was given a refill of his Eliquis today.  We had a lengthy discussion regarding risk factors for DVT and blood clot.  Patient expressed understanding that although his hypercoagulable work-up was negative, he has had slightly higher increase for a second blood clot than the general population and that he should take appropriate measures when needed.  He also has been instructed that if he ever had a second blood clot for any reason in the future, would recommend lifelong anticoagulation at that point.  No further follow-up is necessary.  Please refer patient back if there are any questions or concerns.   Patient expressed understanding and was in agreement with this plan. He also understands that He can call clinic at any time with any questions, concerns, or complaints.   I provided  20 minutes of face-to-face video visit time during this encounter which included chart review, counseling, and coordination of care as documented above.    Lloyd Huger, MD   06/17/2019 2:56 PM

## 2019-06-17 ENCOUNTER — Encounter: Payer: Self-pay | Admitting: Oncology

## 2019-06-17 ENCOUNTER — Inpatient Hospital Stay: Payer: Medicare PPO | Attending: Oncology | Admitting: Oncology

## 2019-06-17 DIAGNOSIS — I2699 Other pulmonary embolism without acute cor pulmonale: Secondary | ICD-10-CM

## 2019-06-17 MED ORDER — APIXABAN 5 MG PO TABS: 5.0000 mg | ORAL_TABLET | Freq: Two times a day (BID) | ORAL | 0 refills | Status: DC

## 2019-06-19 ENCOUNTER — Other Ambulatory Visit: Payer: Self-pay | Admitting: Oncology

## 2019-07-03 LAB — LIPID PANEL
Cholesterol: 138 mg/dL (ref <200)
HDL: 62 mg/dL (ref >=40)
LDL Cholesterol (Calc): 62 mg/dL (calc)
Non-HDL Cholesterol (Calc): 76 mg/dL (calc) (ref <130)
Total CHOL/HDL Ratio: 2.2 (calc) (ref <5.0)
Triglycerides: 55 mg/dL (ref <150)

## 2019-07-03 LAB — IRON,TIBC AND FERRITIN PANEL
%SAT: 52 % (calc) — ABNORMAL HIGH (ref 20–48)
Ferritin: 180 ng/mL (ref 24–380)
Iron: 156 ug/dL (ref 50–180)
TIBC: 300 mcg/dL (calc) (ref 250–425)

## 2019-07-03 LAB — COMPLETE METABOLIC PANEL WITH GFR
AG Ratio: 1.7 (calc) (ref 1.0–2.5)
ALT: 29 U/L (ref 9–46)
AST: 28 U/L (ref 10–35)
Albumin: 4.1 g/dL (ref 3.6–5.1)
Alkaline phosphatase (APISO): 33 U/L — ABNORMAL LOW (ref 35–144)
BUN: 16 mg/dL (ref 7–25)
CO2: 26 mmol/L (ref 20–32)
Calcium: 9.2 mg/dL (ref 8.6–10.3)
Chloride: 107 mmol/L (ref 98–110)
Creat: 1.02 mg/dL (ref 0.70–1.25)
GFR, Est African American: 87 mL/min/{1.73_m2} (ref >=60)
GFR, Est Non African American: 75 mL/min/{1.73_m2} (ref >=60)
Globulin: 2.4 g/dL (calc) (ref 1.9–3.7)
Glucose, Bld: 93 mg/dL (ref 65–99)
Potassium: 4.4 mmol/L (ref 3.5–5.3)
Sodium: 140 mmol/L (ref 135–146)
Total Bilirubin: 0.7 mg/dL (ref 0.2–1.2)
Total Protein: 6.5 g/dL (ref 6.1–8.1)

## 2019-07-03 LAB — APTT: aPTT: 26 s (ref 23–32)

## 2019-07-03 LAB — PROTIME-INR
INR: 1
Prothrombin Time: 10.4 s (ref 9.0–11.5)

## 2019-07-03 LAB — ADVANCED WRITTEN NOTIFICATION (AWN) TEST REFUSAL

## 2019-07-07 ENCOUNTER — Encounter: Payer: Self-pay | Admitting: Family Medicine

## 2019-07-07 ENCOUNTER — Ambulatory Visit: Payer: Medicare PPO | Admitting: Family Medicine

## 2019-07-07 ENCOUNTER — Other Ambulatory Visit: Payer: Self-pay

## 2019-07-07 VITALS — BP 122/78 | HR 62 | Temp 98.1°F | Resp 14 | Ht 69.0 in | Wt 187.1 lb

## 2019-07-07 DIAGNOSIS — E785 Hyperlipidemia, unspecified: Secondary | ICD-10-CM | POA: Diagnosis not present

## 2019-07-07 DIAGNOSIS — I10 Essential (primary) hypertension: Secondary | ICD-10-CM

## 2019-07-07 DIAGNOSIS — I77811 Abdominal aortic ectasia: Secondary | ICD-10-CM

## 2019-07-07 DIAGNOSIS — Z5181 Encounter for therapeutic drug level monitoring: Secondary | ICD-10-CM

## 2019-07-07 DIAGNOSIS — M109 Gout, unspecified: Secondary | ICD-10-CM

## 2019-07-07 DIAGNOSIS — I700 Atherosclerosis of aorta: Secondary | ICD-10-CM

## 2019-07-07 DIAGNOSIS — G4700 Insomnia, unspecified: Secondary | ICD-10-CM

## 2019-07-07 DIAGNOSIS — I2699 Other pulmonary embolism without acute cor pulmonale: Secondary | ICD-10-CM

## 2019-07-07 MED ORDER — EZETIMIBE 10 MG PO TABS: 10.0000 mg | ORAL_TABLET | Freq: Every day | ORAL | 3 refills | Status: DC

## 2019-07-07 NOTE — Progress Notes (Signed)
Patient ID: Victor Knight, male    DOB: 06/07/1950, 69 y.o.   MRN: [id number]  PCP: Delsa Grana, PA-C  Chief Complaint  Patient presents with  . Follow-up  . Hypertension  . Hyperlipidemia  . Insomnia    Subjective:   Victor Knight is a 69 y.o. male, presents to clinic for routine f/up on the above conditions:  He also recently did lab work for further work up of new elevated LFTs.  Repeated lab work did show that he had normal iron, LFTs returned to normal, cholesterol was well controlled.  He declined to have hepatitis panel testing due to cost.  Bilateral pulmonary embolism with negative coagulopathy work-up, patient on Eliquis since diagnosis  04/28/2019 Pt recently had a virtual visit with hematology/Dr. Grayland Ormond for f/up on PE - he recommended staying on NOAC until July, after negative work-up  ASSESSMENT: Bilateral pulmonary embolism. PLAN:   1.  Bilateral pulmonary embolism: Patient has no personal or family history of blood clots.  He had no apparent transient risk factors such as recent surgery or travel.  He does not lead a sedentary lifestyle and remains active.  Full hypercoagulable work-up was either negative or within normal limits.  Patient has no obvious discernible reason for his pulmonary embolism.  Patient will require a minimum of 6 months of anticoagulation completing treatment in July 2021.  He was given a refill of his Eliquis today.  We had a lengthy discussion regarding risk factors for DVT and blood clot.  Patient expressed understanding that although his hypercoagulable work-up was negative, he has had slightly higher increase for a second blood clot than the general population and that he should take appropriate measures when needed.  He also has been instructed that if he ever had a second blood clot for any reason in the future, would recommend lifelong anticoagulation at that point.  No further follow-up is necessary.  Please refer patient back if  there are any questions or concerns.  Patient expressed understanding and was in agreement with this plan. He also understands that He can call clinic at any time with any questions, concerns, or complaints.  Victor Huger, MD   06/17/2019 2:56 PM  Hypertension:  Currently managed on atenolol - on for several years, briefly in the past had HCTZ on, no exertional sx, HR runs 50-60's Pt reports good med compliance and denies any SE.  No lightheadedness, hypotension, syncope. Blood pressure today is well controlled. BP Readings from Last 3 Encounters:  07/07/19 122/78  05/22/19 124/62  05/19/19 (!) 140/91   Pt denies CP, SOB, exertional sx, LE edema, palpitation, Ha's, visual disturbances Dietary efforts for BP? Still does healthy diet, very active,   Hyperlipidemia: Current Medication Regimen:  Rosuvastatin 40 mg nightly, good compliance  Last Lipids: Lab Results  Component Value Date   CHOL 138 07/02/2019   HDL 62 07/02/2019   LDLCALC 62 07/02/2019   TRIG 55 07/02/2019   CHOLHDL 2.2 07/02/2019  - Denies: Chest pain, shortness of breath, myalgias. - Documented aortic atherosclerosis? Yes - Risk factors for atherosclerosis: hypercholesterolemia and hypertension  Diet for HLD and HTN Diet avoids red meat and pork, eats veggies and fruit, doesn't salt anything Prior to PE he was very active, but that has declined a little bit in the last couple months  Gout:   to 1st MTPs mostly in his right foot but has been on R & L before, he has been on allopurinol for years  w/o any recent problems Gout - no flare in years - on allopuirnol 100 mg once daily, changed diet has been well controlled Lab Results  Component Value Date   LABURIC 6.2 02/26/2019  From 2017-2020 uric acid has been mostly well controlled is very gradual increase from 5.7 up to 6.2 without any recent flares patient does not want a repeat blood work today for this  He has a history of abdominal aortic atherosclerosis  and some mild abdominal aortic ectasia that was found in 2017 with his routine Medicare AAA screening, we reviewed the images and reports today and he is due for a follow-up ultrasound in 2022  Lung nodule: Found with his ER work-up and CT of his chest for PE, he notes to Dr. Grayland Ormond and the hematology oncology group will do the follow-up on this with a repeated CT next January  Insomnia: Patient's insomnia he states is well controlled he takes trazodone 50 to 100 mg most days at bedtime and this helps him get adequate sleep and he denies any side effects or concerns at this time    Patient Active Problem List   Diagnosis Date Noted  . Bilateral pulmonary embolism ([place]) 04/28/2019  . Plantar fasciitis of left foot 03/31/2019  . Medicare annual wellness visit, subsequent 07/03/2017  . Tendinitis of shoulder 04/09/2017  . Bursitis of shoulder 04/09/2017  . Shoulder pain 04/09/2017  . Adhesive capsulitis of shoulder 04/09/2017  . Dupuytren contracture 04/09/2017  . Complete tear of right rotator cuff 04/20/2016  . Preventative health care 12/05/2015  . Medication monitoring encounter 11/02/2015  . Abdominal aortic atherosclerosis ([place]) 10/27/2015  . Aortic ectasia, abdominal ([place]) 10/27/2015  . Hx of smoking 10/05/2015  . History of hepatitis C 10/05/2015  . Prostate cancer screening 10/05/2015  . Disease of liver 09/29/2015  . Hypertension 10/05/2014  . Insomnia 10/05/2014  . Hyperlipidemia LDL goal <70 10/05/2014  . Gout 10/05/2014      Current Outpatient Medications:  .  allopurinol (ZYLOPRIM) 100 MG tablet, Take 1 tablet (100 mg total) by mouth daily., Disp: 90 tablet, Rfl: 3 .  apixaban (ELIQUIS) 5 MG TABS tablet, Take 1 tablet (5 mg total) by mouth 2 (two) times daily., Disp: 180 tablet, Rfl: 0 .  atenolol (TENORMIN) 100 MG tablet, Take 1 tablet (100 mg total) by mouth daily., Disp: 90 tablet, Rfl: 3 .  ezetimibe (ZETIA) 10 MG tablet, Take 1 tablet (10 mg total) by mouth  daily., Disp: 90 tablet, Rfl: 1 .  LUMIGAN 0.01 % SOLN, Place 1 drop into the right eye at bedtime. , Disp: , Rfl: 0 .  Multiple Vitamin (MULTIVITAMIN) tablet, Take 1 tablet by mouth daily., Disp: , Rfl:  .  rosuvastatin (CRESTOR) 40 MG tablet, Take 1 tablet (40 mg total) by mouth daily., Disp: 90 tablet, Rfl: 3 .  traZODone (DESYREL) 100 MG tablet, TAKE 1/2 TO 1 TABLET BY MOUTH AT BEDTIME AS NEEDED FOR SLEEP, Disp: 90 tablet, Rfl: 3   Allergies  Allergen Reactions  . Keflex [Cephalexin] Rash     Family History  Problem Relation Age of Onset  . Hypertension Father   . Heart disease Father   . Hypertension Brother   . Hypertension Sister   . Supraventricular tachycardia Daughter   . Heart disease Daughter   . Hypertension Brother   . Hypertension Brother   . Hypertension Brother   . Heart disease Brother      Social History   Socioeconomic History  . Marital  status: Married    Spouse name: [name]  . Number of children: 1  . Years of education: Not on file  . Highest education level: High school graduate  Occupational History  . Occupation: Retired  Tobacco Use  . Smoking status: Former Research scientist (life sciences)  . Smokeless tobacco: Never Used  Substance and Sexual Activity  . Alcohol use: Yes    Alcohol/week: 7.0 standard drinks    Types: 7 Glasses of wine per week  . Drug use: No  . Sexual activity: Yes    Partners: Female  Other Topics Concern  . Not on file  Social History Narrative  . Not on file   Social Determinants of Health   Financial Resource Strain:   . Difficulty of Paying Living Expenses:   Food Insecurity:   . Worried About Charity fundraiser in the Last Year:   . Arboriculturist in the Last Year:   Transportation Needs:   . Film/video editor (Medical):   Marland Kitchen Lack of Transportation (Non-Medical):   Physical Activity: Inactive  . Days of Exercise per Week: 0 days  . Minutes of Exercise per Session: 0 min  Stress:   . Feeling of Stress :   Social  Connections:   . Frequency of Communication with Friends and Family:   . Frequency of Social Gatherings with Friends and Family:   . Attends Religious Services:   . Active Member of Clubs or Organizations:   . Attends Archivist Meetings:   Marland Kitchen Marital Status:   Intimate Partner Violence:   . Fear of Current or Ex-Partner:   . Emotionally Abused:   Marland Kitchen Physically Abused:   . Sexually Abused:     Chart Review Today: I have reviewed the patient's medical history in detail and updated the computerized patient record. ie  Review of Systems 10 Systems reviewed and are negative for acute change except as noted in the HPI.     Objective:   Vitals:   07/07/19 0845  BP: 122/78  Pulse: 62  Resp: 14  Temp: 98.1 F (36.7 C)  SpO2: 95%  Weight: 187 lb 1.6 oz (84.9 kg)  Height: 5\' 9"  (1.753 m)    Body mass index is 27.63 kg/m.  Physical Exam Vitals and nursing note reviewed.  Constitutional:      General: He is not in acute distress.    Appearance: Normal appearance. He is well-developed. He is not ill-appearing, toxic-appearing or diaphoretic.     Interventions: Face mask in place.  HENT:     Head: Normocephalic and atraumatic.     Jaw: No trismus.     Right Ear: External ear normal.     Left Ear: External ear normal.  Eyes:     General: Lids are normal. No scleral icterus.    Conjunctiva/sclera: Conjunctivae normal.     Pupils: Pupils are equal, round, and reactive to light.  Neck:     Trachea: Trachea and phonation normal. No tracheal deviation.  Cardiovascular:     Rate and Rhythm: Regular rhythm. Bradycardia present.     Pulses: Normal pulses.          Radial pulses are 2+ on the right side and 2+ on the left side.       Posterior tibial pulses are 2+ on the right side and 2+ on the left side.     Heart sounds: Normal heart sounds. No murmur. No friction rub. No gallop.   Pulmonary:  Effort: Pulmonary effort is normal. No respiratory distress.     Breath  sounds: Normal breath sounds. No stridor. No wheezing, rhonchi or rales.  Abdominal:     General: Bowel sounds are normal. There is no distension.     Palpations: Abdomen is soft.     Tenderness: There is no abdominal tenderness.  Musculoskeletal:        General: Normal range of motion.     Cervical back: Normal range of motion and neck supple.     Right lower leg: No edema.     Left lower leg: No edema.  Skin:    General: Skin is warm and dry.     Capillary Refill: Capillary refill takes less than 2 seconds.     Coloration: Skin is not jaundiced.     Findings: No rash.     Nails: There is no clubbing.  Neurological:     Mental Status: He is alert.     Cranial Nerves: No dysarthria or facial asymmetry.     Motor: No tremor or abnormal muscle tone.     Gait: Gait normal.  Psychiatric:        Mood and Affect: Mood normal.        Speech: Speech normal.        Behavior: Behavior normal. Behavior is cooperative.      Results for orders placed or performed in visit on 05/22/19  COMPLETE METABOLIC PANEL WITH GFR  Result Value Ref Range   Glucose, Bld 93 65 - 99 mg/dL   BUN 16 7 - 25 mg/dL   Creat 1.02 0.70 - 1.25 mg/dL   GFR, Est Non African American 75 > OR = 60 mL/min/1.29m2   GFR, Est African American 87 > OR = 60 mL/min/1.22m2   BUN/Creatinine Ratio NOT APPLICABLE 6 - 22 (calc)   Sodium 140 135 - 146 mmol/L   Potassium 4.4 3.5 - 5.3 mmol/L   Chloride 107 98 - 110 mmol/L   CO2 26 20 - 32 mmol/L   Calcium 9.2 8.6 - 10.3 mg/dL   Total Protein 6.5 6.1 - 8.1 g/dL   Albumin 4.1 3.6 - 5.1 g/dL   Globulin 2.4 1.9 - 3.7 g/dL (calc)   AG Ratio 1.7 1.0 - 2.5 (calc)   Total Bilirubin 0.7 0.2 - 1.2 mg/dL   Alkaline phosphatase (APISO) 33 (L) 35 - 144 U/L   AST 28 10 - 35 U/L   ALT 29 9 - 46 U/L  Lipid panel  Result Value Ref Range   Cholesterol 138 <200 mg/dL   HDL 62 > OR = 40 mg/dL   Triglycerides 55 <150 mg/dL   LDL Cholesterol (Calc) 62 mg/dL (calc)   Total CHOL/HDL Ratio  2.2 <5.0 (calc)   Non-HDL Cholesterol (Calc) 76 <130 mg/dL (calc)  Iron, TIBC and Ferritin Panel  Result Value Ref Range   Iron 156 50 - 180 mcg/dL   TIBC 300 250 - 425 mcg/dL (calc)   %SAT 52 (H) 20 - 48 % (calc)   Ferritin 180 24 - 380 ng/mL  Protime-INR  Result Value Ref Range   INR 1.0    Prothrombin Time 10.4 9.0 - 11.5 sec  APTT  Result Value Ref Range   aPTT 26 23 - 32 sec  Advanced Written Notification [facility]) Test Refusal  Result Value Ref Range   RAM1     AWN TEST REFUSED HEP. ACUTE PANEL    Depression screen [facility] 2/9 07/07/2019 05/22/2019 05/09/2019  Decreased  Interest 0 0 0  Down, Depressed, Hopeless 0 0 0  PHQ - 2 Score 0 0 0  Altered sleeping 0 0 0  Tired, decreased energy 0 0 0  Change in appetite 0 0 0  Feeling bad or failure about yourself  0 0 0  Trouble concentrating 0 0 0  Moving slowly or fidgety/restless 0 0 0  Suicidal thoughts 0 0 0  PHQ-9 Score 0 0 0  Difficult doing work/chores Not difficult at all Not difficult at all Not difficult at all  Some recent data might be hidden   phq 9 reviewed today and neg      Assessment & Plan:   1. Essential hypertension Blood pressure is well controlled on atenolol, he has heart rate normally in the 50s to 60s and is asymptomatic to bradycardia no medication side effects or concerns very healthy diet.  Encouraged him to resume his exercise as able  Labs ordered today for next routine follow-up visit in 6 months - COMPLETE METABOLIC PANEL WITH GFR  2. Hyperlipidemia LDL goal <70 Well-controlled on Zetia and Crestor 40 mg daily, good compliance  no SE, no myalgias, fatigue or jaundice Diet and exercise recommendations for HLD reviewed  We reviewed his abdominal aortic atherosclerosis and reviewed monitoring for this and explained that he is currently on maximal treatment for this we will continue to monitor  - ezetimibe (ZETIA) 10 MG tablet; Take 1 tablet (10 mg total) by mouth daily.  Dispense: 90 tablet; Refill:  3 - COMPLETE METABOLIC PANEL WITH GFR - Lipid panel  3. Abdominal aortic atherosclerosis ([place]) See above, reviewed as noted in chart old scans and required follow-up, discussed with the patient today all questions asked and answered, no current symptoms concerning for any worsening or development of symptomatic PAD - ezetimibe (ZETIA) 10 MG tablet; Take 1 tablet (10 mg total) by mouth daily.  Dispense: 90 tablet; Refill: 3 - Lipid panel  4. Aortic ectasia, abdominal ([facility]) Mild due for a follow-up ultrasound July 2022 - Lipid panel  5. Bilateral pulmonary embolism ([facility]) Continues to take Eliquis 5 mg twice daily, compliant with medications, no bleeding concerns or side effects, currently no chest pain shortness of breath tachycardia, reviewed past visit with hematology with negative coagulopathy work-up per hematology, he will be on medications for 6 months total and will be done in July.   Reviewed concerning signs and and symptoms of recurrent DVT/PE.  Patient verbalized understanding he has no questions today. Our next follow-up will be in September so he was encouraged to come back sooner if needed with concerns or symptoms encouraged him to go to the ER with any concerning symptoms red flags  - COMPLETE METABOLIC PANEL WITH GFR  6. Gout involving toe of right foot, unspecified cause, unspecified chronicity Last uric acid was mildly elevated above goal, at 6.2 but he has had no dose adjustment in several years he denies any recent gout flares, blood work was completed prior to this visit he defers checking today but agrees to check with next labs which will be about 6 months from now he continues to have a low purine diet and is compliant with his allopurinol - COMPLETE METABOLIC PANEL WITH GFR - Uric acid  7. Insomnia, unspecified type Stable, well controlled with trazodone 50 to 100 mg as needed at bedtime  8. Encounter for medication monitoring Patient will come do labs in 6 months  one week prior to next f/up visit - COMPLETE METABOLIC PANEL  WITH GFR - Lipid panel - Uric acid - CBC with Differential/Platelet   Patient did his lab work last week including CMP lipid profile iron panel PT/INR and APTT We reviewed his lab work today which showed improved APTT to normal, improved LFTs, essentially normal iron panel, cholesterol is well controlled, normal blood sugar  6 month f/up on routine conditions, labs complete one week prior to be reviewed at visit.  Delsa Grana, PA-C 07/07/19 9:13 AM

## 2019-08-21 ENCOUNTER — Telehealth: Payer: Self-pay | Admitting: Family Medicine

## 2019-08-21 NOTE — Chronic Care Management (AMB) (Signed)
  Chronic Care Management   Note  08/21/2019 Name: Victor Knight MRN: [id number] DOB: 07-29-1950  Victor Knight is a 69 y.o. year old male who is a primary care patient of Delsa Grana, [place]. I reached out to [name] by phone today in response to a referral sent by Victor Knight's health plan.     Victor Knight was given information about Chronic Care Management services today including:  1. CCM service includes personalized support from designated clinical staff supervised by his physician, including individualized plan of care and coordination with other care providers 2. 24/7 contact phone numbers for assistance for urgent and routine care needs. 3. Service will only be billed when office clinical staff spend 20 minutes or more in a month to coordinate care. 4. Only one practitioner may furnish and bill the service in a calendar month. 5. The patient may stop CCM services at any time (effective at the end of the month) by phone call to the office staff. 6. The patient will be responsible for cost sharing (co-pay) of up to 20% of the service fee (after annual deductible is met).  Patient agreed to services and verbal consent obtained.   Follow up plan: Telephone appointment with care management team member scheduled for:09/11/2019  Victor Knight, [address], [place], [place] [address] Direct Dial: [phone] [email or web address] Website: [email or web address]

## 2019-08-22 ENCOUNTER — Encounter: Payer: Self-pay | Admitting: Family Medicine

## 2019-08-22 ENCOUNTER — Other Ambulatory Visit: Payer: Self-pay

## 2019-08-22 ENCOUNTER — Ambulatory Visit: Payer: Medicare PPO | Admitting: Family Medicine

## 2019-08-22 VITALS — BP 130/80 | HR 63 | Temp 98.1°F | Resp 14 | Ht 69.0 in | Wt 195.8 lb

## 2019-08-22 DIAGNOSIS — I2699 Other pulmonary embolism without acute cor pulmonale: Secondary | ICD-10-CM

## 2019-08-22 DIAGNOSIS — R635 Abnormal weight gain: Secondary | ICD-10-CM

## 2019-08-22 DIAGNOSIS — Z7901 Long term (current) use of anticoagulants: Secondary | ICD-10-CM | POA: Diagnosis not present

## 2019-08-22 DIAGNOSIS — R5383 Other fatigue: Secondary | ICD-10-CM | POA: Diagnosis not present

## 2019-08-22 NOTE — Patient Instructions (Signed)

## 2019-08-22 NOTE — Progress Notes (Signed)
Patient ID: Victor Knight, male    DOB: [birth year], 69 y.o.   MRN: [id number]  PCP: Delsa Grana, PA-C  Chief Complaint  Patient presents with  . Fatigue    loss of energy onset 6 months    Subjective:   Victor Knight is a 69 y.o. male, presents to clinic with CC of the following:  HPI  Presents for fatigue x 6 months He notes that with his PE and winter he had decreased how much is exercising and gained a little weight, but otherwise he has not noticed any other significant associated sx.  He has been reading in was concerned about his testosterone, had been having some decreased libido and wondered if this could be checked.  He is not having any chest pain, exertional symptoms, diaphoresis, lymphadenopathy, unintentional weight loss, pallor, spontaneous bruising or bleeding.  He has that having any difficulty sleeping, he denies any mood changes, swelling, constipation or bowel changes, cold or hot intolerance.   Patient Active Problem List   Diagnosis Date Noted  . Bilateral pulmonary embolism ([place]) 04/28/2019  . Plantar fasciitis of left foot 03/31/2019  . Medicare annual wellness visit, subsequent 07/03/2017  . Tendinitis of shoulder 04/09/2017  . Bursitis of shoulder 04/09/2017  . Shoulder pain 04/09/2017  . Adhesive capsulitis of shoulder 04/09/2017  . Dupuytren contracture 04/09/2017  . Complete tear of right rotator cuff 04/20/2016  . Preventative health care 12/05/2015  . Medication monitoring encounter 11/02/2015  . Abdominal aortic atherosclerosis ([employer]) 10/27/2015  . Aortic ectasia, abdominal ([place]) 10/27/2015  . Hx of smoking 10/05/2015  . History of hepatitis C 10/05/2015  . Prostate cancer screening 10/05/2015  . Disease of liver 09/29/2015  . Hypertension 10/05/2014  . Insomnia 10/05/2014  . Hyperlipidemia LDL goal <70 10/05/2014  . Gout 10/05/2014      Current Outpatient Medications:  .  allopurinol (ZYLOPRIM) 100 MG tablet, Take 1 tablet  (100 mg total) by mouth daily., Disp: 90 tablet, Rfl: 3 .  apixaban (ELIQUIS) 5 MG TABS tablet, Take 1 tablet (5 mg total) by mouth 2 (two) times daily., Disp: 180 tablet, Rfl: 0 .  atenolol (TENORMIN) 100 MG tablet, Take 1 tablet (100 mg total) by mouth daily., Disp: 90 tablet, Rfl: 3 .  ezetimibe (ZETIA) 10 MG tablet, Take 1 tablet (10 mg total) by mouth daily., Disp: 90 tablet, Rfl: 3 .  LUMIGAN 0.01 % SOLN, Place 1 drop into the right eye at bedtime. , Disp: , Rfl: 0 .  Multiple Vitamin (MULTIVITAMIN) tablet, Take 1 tablet by mouth daily., Disp: , Rfl:  .  rosuvastatin (CRESTOR) 40 MG tablet, Take 1 tablet (40 mg total) by mouth daily., Disp: 90 tablet, Rfl: 3 .  traZODone (DESYREL) 100 MG tablet, TAKE 1/2 TO 1 TABLET BY MOUTH AT BEDTIME AS NEEDED FOR SLEEP, Disp: 90 tablet, Rfl: 3   Allergies  Allergen Reactions  . Keflex [Cephalexin] Rash     Family History  Problem Relation Age of Onset  . Hypertension Father   . Heart disease Father   . Hypertension Brother   . Hypertension Sister   . Supraventricular tachycardia Daughter   . Heart disease Daughter   . Hypertension Brother   . Hypertension Brother   . Hypertension Brother   . Heart disease Brother      Social History   Socioeconomic History  . Marital status: Married    Spouse name: [name]  . Number of children: 1  . Years  of education: Not on file  . Highest education level: High school graduate  Occupational History  . Occupation: Retired  Tobacco Use  . Smoking status: Former Research scientist (life sciences)  . Smokeless tobacco: Never Used  Substance and Sexual Activity  . Alcohol use: Yes    Alcohol/week: 7.0 standard drinks    Types: 7 Glasses of wine per week  . Drug use: No  . Sexual activity: Yes    Partners: Female  Other Topics Concern  . Not on file  Social History Narrative  . Not on file   Social Determinants of Health   Financial Resource Strain:   . Difficulty of Paying Living Expenses:   Food Insecurity:     . Worried About Charity fundraiser in the Last Year:   . Arboriculturist in the Last Year:   Transportation Needs:   . Film/video editor (Medical):   Marland Kitchen Lack of Transportation (Non-Medical):   Physical Activity: Inactive  . Days of Exercise per Week: 0 days  . Minutes of Exercise per Session: 0 min  Stress:   . Feeling of Stress :   Social Connections:   . Frequency of Communication with Friends and Family:   . Frequency of Social Gatherings with Friends and Family:   . Attends Religious Services:   . Active Member of Clubs or Organizations:   . Attends Archivist Meetings:   Marland Kitchen Marital Status:   Intimate Partner Violence:   . Fear of Current or Ex-Partner:   . Emotionally Abused:   Marland Kitchen Physically Abused:   . Sexually Abused:     Chart Review Today: I personally reviewed active problem list, medication list, allergies, family history, social history, health maintenance, notes from last encounter, lab results, imaging with the patient/caregiver today.   Review of Systems 10 Systems reviewed and are negative for acute change except as noted in the HPI.     Objective:   Vitals:   08/22/19 1331  BP: 130/80  Pulse: 63  Resp: 14  Temp: 98.1 F (36.7 C)  SpO2: 98%  Weight: 195 lb 12.8 oz (88.8 kg)  Height: 5\' 9"  (1.753 m)    Body mass index is 28.91 kg/m.  Physical Exam Vitals and nursing note reviewed.  Constitutional:      Appearance: He is well-developed.  HENT:     Head: Normocephalic and atraumatic.     Nose: Nose normal.  Eyes:     General:        Right eye: No discharge.        Left eye: No discharge.     Conjunctiva/sclera: Conjunctivae normal.  Neck:     Trachea: No tracheal deviation.  Cardiovascular:     Rate and Rhythm: Normal rate and regular rhythm.  Pulmonary:     Effort: Pulmonary effort is normal. No respiratory distress.     Breath sounds: No stridor.  Musculoskeletal:        General: Normal range of motion.  Skin:     General: Skin is warm and dry.     Findings: No rash.  Neurological:     Mental Status: He is alert.     Motor: No abnormal muscle tone.     Coordination: Coordination normal.  Psychiatric:        Behavior: Behavior normal.       Results for orders placed or performed in visit on 05/22/19  COMPLETE METABOLIC PANEL WITH GFR  Result Value Ref Range  Glucose, Bld 93 65 - 99 mg/dL   BUN 16 7 - 25 mg/dL   Creat 1.02 0.70 - 1.25 mg/dL   GFR, Est Non African American 75 > OR = 60 mL/min/1.80m2   GFR, Est African American 87 > OR = 60 mL/min/1.37m2   BUN/Creatinine Ratio NOT APPLICABLE 6 - 22 (calc)   Sodium 140 135 - 146 mmol/L   Potassium 4.4 3.5 - 5.3 mmol/L   Chloride 107 98 - 110 mmol/L   CO2 26 20 - 32 mmol/L   Calcium 9.2 8.6 - 10.3 mg/dL   Total Protein 6.5 6.1 - 8.1 g/dL   Albumin 4.1 3.6 - 5.1 g/dL   Globulin 2.4 1.9 - 3.7 g/dL (calc)   AG Ratio 1.7 1.0 - 2.5 (calc)   Total Bilirubin 0.7 0.2 - 1.2 mg/dL   Alkaline phosphatase (APISO) 33 (L) 35 - 144 U/L   AST 28 10 - 35 U/L   ALT 29 9 - 46 U/L  Lipid panel  Result Value Ref Range   Cholesterol 138 <200 mg/dL   HDL 62 > OR = 40 mg/dL   Triglycerides 55 <150 mg/dL   LDL Cholesterol (Calc) 62 mg/dL (calc)   Total CHOL/HDL Ratio 2.2 <5.0 (calc)   Non-HDL Cholesterol (Calc) 76 <130 mg/dL (calc)  Iron, TIBC and Ferritin Panel  Result Value Ref Range   Iron 156 50 - 180 mcg/dL   TIBC 300 250 - 425 mcg/dL (calc)   %SAT 52 (H) 20 - 48 % (calc)   Ferritin 180 24 - 380 ng/mL  Protime-INR  Result Value Ref Range   INR 1.0    Prothrombin Time 10.4 9.0 - 11.5 sec  APTT  Result Value Ref Range   aPTT 26 23 - 32 sec  Advanced Written Notification [facility]) Test Refusal  Result Value Ref Range   RAM1     AWN TEST REFUSED HEP. ACUTE PANEL         Assessment & Plan:      ICD-10-CM   1. Other fatigue  R53.83 CBC with Differential/Platelet    COMPLETE METABOLIC PANEL WITH GFR    TSH    Testosterone    CANCELED:  Testosterone   Rule out anemia, hypothyroid, low testosterone  2. Weight gain  R63.5 TSH   Suspect it is likely from lifestyle changes decreased exercise and recent health issues, but will check thyroid  3. Anticoagulated by anticoagulation treatment  Z79.01 CBC with Differential/Platelet    COMPLETE METABOLIC PANEL WITH GFR   Monitor renal function and CBC        Delsa Grana, PA-C 08/22/19 1:43 PM

## 2019-08-26 ENCOUNTER — Ambulatory Visit: Payer: PPO | Admitting: Family Medicine

## 2019-08-28 LAB — COMPLETE METABOLIC PANEL WITH GFR
AG Ratio: 2 (calc) (ref 1.0–2.5)
ALT: 28 U/L (ref 9–46)
AST: 28 U/L (ref 10–35)
Albumin: 4.3 g/dL (ref 3.6–5.1)
Alkaline phosphatase (APISO): 33 U/L — ABNORMAL LOW (ref 35–144)
BUN: 17 mg/dL (ref 7–25)
CO2: 27 mmol/L (ref 20–32)
Calcium: 9 mg/dL (ref 8.6–10.3)
Chloride: 107 mmol/L (ref 98–110)
Creat: 1.02 mg/dL (ref 0.70–1.25)
GFR, Est African American: 87 mL/min/{1.73_m2} (ref >=60)
GFR, Est Non African American: 75 mL/min/{1.73_m2} (ref >=60)
Globulin: 2.1 g/dL (calc) (ref 1.9–3.7)
Glucose, Bld: 104 mg/dL — ABNORMAL HIGH (ref 65–99)
Potassium: 4.5 mmol/L (ref 3.5–5.3)
Sodium: 141 mmol/L (ref 135–146)
Total Bilirubin: 0.6 mg/dL (ref 0.2–1.2)
Total Protein: 6.4 g/dL (ref 6.1–8.1)

## 2019-08-28 LAB — CBC WITH DIFFERENTIAL/PLATELET
Absolute Monocytes: 480 cells/uL (ref 200–950)
Basophils Absolute: 38 cells/uL (ref 0–200)
Basophils Relative: 0.8 %
Eosinophils Absolute: 82 cells/uL (ref 15–500)
Eosinophils Relative: 1.7 %
HCT: 43.8 % (ref 38.5–50.0)
Hemoglobin: 14.7 g/dL (ref 13.2–17.1)
Lymphs Abs: 1666 cells/uL (ref 850–3900)
MCH: 31.2 pg (ref 27.0–33.0)
MCHC: 33.6 g/dL (ref 32.0–36.0)
MCV: 93 fL (ref 80.0–100.0)
MPV: 10 fL (ref 7.5–12.5)
Monocytes Relative: 10 %
Neutro Abs: 2534 cells/uL (ref 1500–7800)
Neutrophils Relative %: 52.8 %
Platelets: 149 10*3/uL (ref 140–400)
RBC: 4.71 10*6/uL (ref 4.20–5.80)
RDW: 12 % (ref 11.0–15.0)
Total Lymphocyte: 34.7 %
WBC: 4.8 10*3/uL (ref 3.8–10.8)

## 2019-08-28 LAB — LIPID PANEL
Cholesterol: 138 mg/dL (ref <200)
HDL: 62 mg/dL (ref >=40)
LDL Cholesterol (Calc): 62 mg/dL (calc)
Non-HDL Cholesterol (Calc): 76 mg/dL (calc) (ref <130)
Total CHOL/HDL Ratio: 2.2 (calc) (ref <5.0)
Triglycerides: 62 mg/dL (ref <150)

## 2019-08-28 LAB — TSH: TSH: 1.76 mIU/L (ref 0.40–4.50)

## 2019-08-28 LAB — TESTOSTERONE: Testosterone: 367 ng/dL (ref 250–827)

## 2019-08-28 LAB — URIC ACID: Uric Acid, Serum: 4.9 mg/dL (ref 4.0–8.0)

## 2019-08-29 ENCOUNTER — Other Ambulatory Visit: Payer: Self-pay | Admitting: Family Medicine

## 2019-08-29 DIAGNOSIS — R5383 Other fatigue: Secondary | ICD-10-CM

## 2019-09-08 LAB — TESTOSTERONE: Testosterone: 263 ng/dL (ref 250–827)

## 2019-09-11 ENCOUNTER — Other Ambulatory Visit: Payer: Self-pay

## 2019-09-11 ENCOUNTER — Ambulatory Visit: Payer: Medicare PPO | Admitting: Pharmacist

## 2019-09-11 DIAGNOSIS — I10 Essential (primary) hypertension: Secondary | ICD-10-CM

## 2019-09-11 DIAGNOSIS — E785 Hyperlipidemia, unspecified: Secondary | ICD-10-CM

## 2019-09-11 NOTE — Chronic Care Management (AMB) (Signed)
Chronic Care Management Pharmacy  Name: Victor Knight  MRN: [id number] DOB: [birth year]  Chief Complaint/ HPI  Victor Knight,  69 y.o. , male presents for their Initial CCM visit with the clinical pharmacist via telephone due to COVID-19 Pandemic.  PCP : Delsa Grana, PA-C  Their chronic conditions include: HTN, HLD  Office Visits: 5/7 fatigue, [place], BP 130/80 P 63 Wt 195 BMI 28.9, s/p PE, dec exercise, gained weight 3/22 HTN, [place], BP 122/78 P 62 Wt 187 BMI 27.6  Consult Visit: NA  Medications: Outpatient Encounter Medications as of 09/11/2019  Medication Sig  . allopurinol (ZYLOPRIM) 100 MG tablet Take 1 tablet (100 mg total) by mouth daily.  Marland Kitchen apixaban (ELIQUIS) 5 MG TABS tablet Take 1 tablet (5 mg total) by mouth 2 (two) times daily.  Marland Kitchen atenolol (TENORMIN) 100 MG tablet Take 1 tablet (100 mg total) by mouth daily.  Marland Kitchen ezetimibe (ZETIA) 10 MG tablet Take 1 tablet (10 mg total) by mouth daily.  Marland Kitchen LUMIGAN 0.01 % SOLN Place 1 drop into the right eye at bedtime.   . Multiple Vitamin (MULTIVITAMIN) tablet Take 1 tablet by mouth daily.  . rosuvastatin (CRESTOR) 40 MG tablet Take 1 tablet (40 mg total) by mouth daily.  . traZODone (DESYREL) 100 MG tablet TAKE 1/2 TO 1 TABLET BY MOUTH AT BEDTIME AS NEEDED FOR SLEEP   No facility-administered encounter medications on file as of 09/11/2019.     Tobacco Use: Medium Risk  . Smoking Tobacco Use: Former Smoker  . Smokeless Tobacco Use: Never Used   Current Diagnosis/Assessment:  Goals Addressed            This Visit's Progress   . Chronic Care Management       CARE PLAN ENTRY  Current Barriers:  . Chronic Disease Management support, education, and care coordination needs related to Hypertension and Hyperlipidemia   Hypertension . Pharmacist Clinical Goal(s): o Over the next 90 days, patient will work with PharmD and providers to maintain BP goal <130/80 . Current regimen:  o Atenolol 100mg   daily . Interventions: o Decrease atenolol 50mg  daily for 7 days o Start lisinopril 10mg  1 tab by mouth daily o Decrease atenolol 25mg  daily for 7 days o Stop atenolol . Patient self care activities - Over the next 90 days, patient will: o Check BP daily, document, and provide at future appointments o Don't hurry atenolol taper. It takes time for your body to adjust o Ensure daily salt intake < 2300 mg/day  Hyperlipidemia . Pharmacist Clinical Goal(s): o Over the next 90 days, patient will work with PharmD and providers to maintain LDL goal < 70 . Current regimen:  o Crestor 40mg  daily o Zetia 10mg  daily . Interventions: o Stop Zetia . Patient self care activities - Over the next 90 days, patient will: o Make minor lifestyle adjustments after stopping Zetia to ensure goal for bad cholesterol  Pulmonary Embolism . Pharmacist Clinical Goal(s) o Over the next 30 days, patient will work with PharmD and providers to safely stop Eliquis . Current regimen:  o Eliquis 5mg  twice daily . Interventions: o Planning June 2021 stop date for Eliquis after pulmonary embolism . Patient self care activities - Over the next 90 days, patient will: o Report any symptoms of lower calf pain to provider  Medication management . Pharmacist Clinical Goal(s): o Over the next 90 days, patient will work with PharmD and providers to maintain optimal medication adherence . Current pharmacy: [facility] . Interventions o  Comprehensive medication review performed. o Continue current medication management strategy . Patient self care activities - Over the next 90 days, patient will: o Focus on medication adherence by stopping atenolol, Zetia as described o Stop Eliquis according to pulmonary embolism treatment guidelines o Report any questions or concerns to PharmD and/or provider(s)  Initial goal documentation       Hypertension   Office blood pressures are  BP Readings from Last 3 Encounters:   08/22/19 130/80  07/07/19 122/78  05/22/19 124/62    Patient has failed these meds in the past: NA  Patient checks BP at home: daily  Patient home BP readings are ranging: 125/80  We discussed: Atenolol being old, feels like he's on autopilot Cut back on red meat, pork Eliquis until June  Plan Consider d/c atenolol which is not first line for HTN Consider start metoprolol succinate 50mg  1 tab daily   Hyperlipidemia   Lipid Panel     Component Value Date/Time   CHOL 138 08/27/2019 0936   TRIG 62 08/27/2019 0936   HDL 62 08/27/2019 0936   LDLCALC 62 08/27/2019 0936     The 10-year ASCVD risk score Mikey Bussing DC Jr., et al., 2013) is: 15%   Values used to calculate the score:     Age: 5 years     Sex: Male     Is Non-Hispanic African American: No     Diabetic: No     Tobacco smoker: No     Systolic Blood Pressure: [id number]     Is BP treated: Yes     HDL Cholesterol: 62 mg/dL     Total Cholesterol: 138 mg/dL   Patient has failed these meds in past: NA Patient is currently controlled on the following medications:  . Crestor 40mg  daily  We discussed:   Crestor 40mg  daily At goal LDL, TG. HDL good enough to remove risk factor  Thinks Zetia was added when he wasn't at goal Doubts need for Zetia  Plan  Consider d/c Zetia to reduce pill burden Continue current medications   Medication Management   Pt uses [facility] for all medications Uses pill box? Yes Pt endorses 100% compliance  We discussed: Last gout attack 4 - 5 years ago Trazodone 100mg  every night Lumigan just in 1 eye, no actual diagnosis Was taking meloxicam  Plan  Continue current medication management strategy  Follow up: 3 month phone visit  Milus Height, PharmD, [place], [facility] [phone]

## 2019-09-11 NOTE — Patient Instructions (Signed)
Visit Information  Goals Addressed            This Visit's Progress   . Chronic Care Management       CARE PLAN ENTRY  Current Barriers:  . Chronic Disease Management support, education, and care coordination needs related to Hypertension and Hyperlipidemia   Hypertension . Pharmacist Clinical Goal(s): o Over the next 90 days, patient will work with PharmD and providers to maintain BP goal <130/80 . Current regimen:  o Atenolol 100mg  daily . Interventions: o Decrease atenolol 50mg  daily for 7 days o Start lisinopril 10mg  1 tab by mouth daily o Decrease atenolol 25mg  daily for 7 days o Stop atenolol . Patient self care activities - Over the next 90 days, patient will: o Check BP daily, document, and provide at future appointments o Don't hurry atenolol taper. It takes time for your body to adjust o Ensure daily salt intake < 2300 mg/day  Hyperlipidemia . Pharmacist Clinical Goal(s): o Over the next 90 days, patient will work with PharmD and providers to maintain LDL goal < 70 . Current regimen:  o Crestor 40mg  daily o Zetia 10mg  daily . Interventions: o Stop Zetia . Patient self care activities - Over the next 90 days, patient will: o Make minor lifestyle adjustments after stopping Zetia to ensure goal for bad cholesterol  Pulmonary Embolism . Pharmacist Clinical Goal(s) o Over the next 30 days, patient will work with PharmD and providers to safely stop Eliquis . Current regimen:  o Eliquis 5mg  twice daily . Interventions: o Planning June 2021 stop date for Eliquis after pulmonary embolism . Patient self care activities - Over the next 90 days, patient will: o Report any symptoms of lower calf pain to provider  Medication management . Pharmacist Clinical Goal(s): o Over the next 90 days, patient will work with PharmD and providers to maintain optimal medication adherence . Current pharmacy: [facility] . Interventions o Comprehensive medication review  performed. o Continue current medication management strategy . Patient self care activities - Over the next 90 days, patient will: o Focus on medication adherence by stopping atenolol, Zetia as described o Stop Eliquis according to pulmonary embolism treatment guidelines o Report any questions or concerns to PharmD and/or provider(s)  Initial goal documentation        Mr. Hider was given information about Chronic Care Management services today including:  1. CCM service includes personalized support from designated clinical staff supervised by his physician, including individualized plan of care and coordination with other care providers 2. 24/7 contact phone numbers for assistance for urgent and routine care needs. 3. Standard insurance, coinsurance, copays and deductibles apply for chronic care management only during months in which we provide at least 20 minutes of these services. Most insurances cover these services at 100%, however patients may be responsible for any copay, coinsurance and/or deductible if applicable. This service may help you avoid the need for more expensive face-to-face services. 4. Only one practitioner may furnish and bill the service in a calendar month. 5. The patient may stop CCM services at any time (effective at the end of the month) by phone call to the office staff.  Patient agreed to services and verbal consent obtained.   Print copy of patient instructions provided.  Telephone follow up appointment with pharmacy team member scheduled for: 3 months  Milus Height, PharmD, [place], [facility] [phone]

## 2019-09-16 ENCOUNTER — Telehealth: Payer: Self-pay

## 2019-09-16 DIAGNOSIS — R7989 Other specified abnormal findings of blood chemistry: Secondary | ICD-10-CM

## 2019-09-16 NOTE — Telephone Encounter (Signed)
-----   Message from Delsa Grana, [place] sent at 09/12/2019  4:10 PM EDT ----- Please notify pt of labwork  Second testosterone was low - (low end of normal which is considered low T) Please refer to urology for low T

## 2019-09-23 ENCOUNTER — Encounter: Payer: Self-pay | Admitting: *Deleted

## 2019-10-08 ENCOUNTER — Encounter: Payer: Self-pay | Admitting: Urology

## 2019-10-08 ENCOUNTER — Ambulatory Visit: Payer: Medicare PPO | Admitting: Urology

## 2019-10-08 ENCOUNTER — Other Ambulatory Visit: Payer: Self-pay

## 2019-10-08 VITALS — BP 151/80 | HR 60 | Ht 69.0 in | Wt 195.0 lb

## 2019-10-08 DIAGNOSIS — R5383 Other fatigue: Secondary | ICD-10-CM

## 2019-10-08 NOTE — Progress Notes (Signed)
10/08/2019 10:44 AM   [name] Nov 16, 1950 [id number]  Referring provider: Delsa Grana, PA-C [address] [place],  [place] [address] Chief Complaint  Patient presents with  . Other    HPI: Victor Knight is a 69 y.o. male who presents for low testosterone.  -Recent PCP visit, he complained of tiredness and fatigue. He had friends who started testosterone for similar symptoms and requested testosterone level.   -Initial testosteronelevel 5/12/21was normal at 367, and repeat level was low normal at 263 -No ED and libido is normal -No bothersome lower urinary tract symptoms -No breast tenderness/ enlargement -History of DVT and pulmonary emboli followed by hematology   PMH: Past Medical History:  Diagnosis Date  . Abdominal aortic atherosclerosis ([place]) 10/27/2015  . Aortic ectasia, abdominal ([place]) 10/27/2015  . Gout   . Hyperlipidemia   . Hypertension   . Insomnia     Surgical History: Past Surgical History:  Procedure Laterality Date  . CARPAL TUNNEL RELEASE    . rotator cuff surgery   04/20/2016   right arm; [facility]   . TRIGGER FINGER RELEASE      Home Medications:  Allergies as of 10/08/2019      Reactions   Keflex [cephalexin] Rash      Medication List       Accurate as of October 08, 2019 10:44 AM. If you have any questions, ask your nurse or doctor.        allopurinol 100 MG tablet Commonly known as: ZYLOPRIM Take 1 tablet (100 mg total) by mouth daily.   apixaban 5 MG Tabs tablet Commonly known as: ELIQUIS Take 1 tablet (5 mg total) by mouth 2 (two) times daily.   atenolol 100 MG tablet Commonly known as: TENORMIN Take 1 tablet (100 mg total) by mouth daily.   ezetimibe 10 MG tablet Commonly known as: Zetia Take 1 tablet (10 mg total) by mouth daily.   Lumigan 0.01 % Soln Generic drug: bimatoprost Place 1 drop into the right eye at bedtime.   multivitamin tablet Take 1 tablet by mouth daily.   rosuvastatin 40 MG  tablet Commonly known as: CRESTOR Take 1 tablet (40 mg total) by mouth daily.   traZODone 100 MG tablet Commonly known as: DESYREL TAKE 1/2 TO 1 TABLET BY MOUTH AT BEDTIME AS NEEDED FOR SLEEP       Allergies:  Allergies  Allergen Reactions  . Keflex [Cephalexin] Rash    Family History: Family History  Problem Relation Age of Onset  . Hypertension Father   . Heart disease Father   . Hypertension Brother   . Hypertension Sister   . Supraventricular tachycardia Daughter   . Heart disease Daughter   . Hypertension Brother   . Hypertension Brother   . Hypertension Brother   . Heart disease Brother     Social History:  reports that he has quit smoking. He has never used smokeless tobacco. He reports current alcohol use of about 7.0 standard drinks of alcohol per week. He reports that he does not use drugs.   Physical Exam: BP (!) 151/80   Pulse 60   Ht 5\' 9"  (1.753 m)   Wt 195 lb (88.5 kg)   BMI 28.80 kg/m   Constitutional:  Alert and oriented, No acute distress. HEENT: [place] AT, moist mucus membranes.  Trachea midline, no masses. Cardiovascular: No clubbing, cyanosis, or edema. Respiratory: Normal respiratory effort, no increased work of breathing. GU: Phallus without lesions, testes descended bilaterally  without masses or tenderness; normal size bilaterally. Skin: No rashes, bruises or suspicious lesions. Neurologic: Grossly intact, no focal deficits, moving all 4 extremities. Psychiatric: Normal mood and affect.  Laboratory Data:  Lab Results  Component Value Date   CREATININE 1.02 08/27/2019    Lab Results  Component Value Date   PSA 1.2 03/28/2019   PSA 0.8 02/22/2018   PSA 0.6 02/12/2017    Assessment & Plan:   1. Tiredness and fatigue -Tesosterone levels are not abnormally low and replacemenet will not be covered by insurance with these levels -I will check with his hematologist regarding any contraindication to testosterone replacement with his  history of DVT/pulmonary emboli.  If no contraindication he will retun for a AM total and free [place] [address], [place], [place] [address] [phone]  I, Joneen Boers Peace, am acting as a Education administrator for Dr. Nicki Reaper C. [name].

## 2019-11-09 ENCOUNTER — Other Ambulatory Visit: Payer: Self-pay | Admitting: Oncology

## 2019-12-15 ENCOUNTER — Other Ambulatory Visit: Payer: Self-pay

## 2019-12-16 ENCOUNTER — Telehealth: Payer: Self-pay

## 2020-01-06 NOTE — Progress Notes (Signed)
Name: Victor Knight   MRN: [id number]    DOB: 10/03/1950   Date:01/07/2020       Progress Note  Chief Complaint  Patient presents with  . Hyperlipidemia  . Hypertension     Subjective:   Victor Knight is a 69 y.o. male, presents to clinic for routine f/up on HLD, HTN and gout  Hyperlipidemia: Currently treated with Crestor 40 mg, pt reports good med compliance Labs done earlier this year, were at goal.   He did consult with [facility] pharmacist at request of his insurance (?) and they did discuss possibly stopping zetia since his lipids were at goal.  Last Lipids: Lab Results  Component Value Date   CHOL 138 08/27/2019   HDL 62 08/27/2019   LDLCALC 62 08/27/2019   TRIG 62 08/27/2019   CHOLHDL 2.2 08/27/2019   - Denies: Chest pain, shortness of breath, myalgias, claudication  Hypertension:  Currently managed on Atenolol 100mg  qd - discussed with pharmacist changing this med?    He has been on it for a decade or 2 he denies any past arrhythmia palpitations tachycardia that he can recall he has no drug allergies to blood pressure medication Pt reports good  med compliance and denies any SE.   Blood pressure today is well controlled. BP Readings from Last 3 Encounters:  01/07/20 124/78  10/08/19 (!) 151/80  08/22/19 130/80   Pt denies CP, SOB, exertional sx, LE edema, palpitation, Ha's, visual disturbances, lightheadedness, hypotension, syncope. Dietary efforts for BP?  Still trying to get back to where he was before his PE  PE - working with heme/onc - was on eliquis until June for unprovoked PE with negative coagulopathy work up  Gout - on allopurinol 100 mg daily, last gout attack was 4-5 years ago.  Consulted with urology for concerns of Low T and fatigue    Current Outpatient Medications:  .  allopurinol (ZYLOPRIM) 100 MG tablet, Take 1 tablet (100 mg total) by mouth daily., Disp: 90 tablet, Rfl: 3 .  atenolol (TENORMIN) 100 MG tablet, Take 1 tablet (100 mg  total) by mouth daily., Disp: 90 tablet, Rfl: 3 .  ezetimibe (ZETIA) 10 MG tablet, Take 1 tablet (10 mg total) by mouth daily., Disp: 90 tablet, Rfl: 3 .  LUMIGAN 0.01 % SOLN, Place 1 drop into the right eye at bedtime. , Disp: , Rfl: 0 .  Multiple Vitamin (MULTIVITAMIN) tablet, Take 1 tablet by mouth daily., Disp: , Rfl:  .  rosuvastatin (CRESTOR) 40 MG tablet, Take 1 tablet (40 mg total) by mouth daily., Disp: 90 tablet, Rfl: 3 .  traZODone (DESYREL) 100 MG tablet, TAKE 1/2 TO 1 TABLET BY MOUTH AT BEDTIME AS NEEDED FOR SLEEP, Disp: 90 tablet, Rfl: 3 .  apixaban (ELIQUIS) 5 MG TABS tablet, Take 1 tablet (5 mg total) by mouth 2 (two) times daily. (Patient not taking: Reported on 01/07/2020), Disp: 180 tablet, Rfl: 0  Patient Active Problem List   Diagnosis Date Noted  . Bilateral pulmonary embolism ([place]) 04/28/2019  . Plantar fasciitis of left foot 03/31/2019  . Medicare annual wellness visit, subsequent 07/03/2017  . Tendinitis of shoulder 04/09/2017  . Bursitis of shoulder 04/09/2017  . Shoulder pain 04/09/2017  . Adhesive capsulitis of shoulder 04/09/2017  . Dupuytren contracture 04/09/2017  . Complete tear of right rotator cuff 04/20/2016  . Preventative health care 12/05/2015  . Medication monitoring encounter 11/02/2015  . Abdominal aortic atherosclerosis ([place]) 10/27/2015  . Aortic ectasia, abdominal ([place])  10/27/2015  . Hx of smoking 10/05/2015  . History of hepatitis C 10/05/2015  . Prostate cancer screening 10/05/2015  . Disease of liver 09/29/2015  . Hypertension 10/05/2014  . Insomnia 10/05/2014  . Hyperlipidemia LDL goal <70 10/05/2014  . Gout 10/05/2014    Past Surgical History:  Procedure Laterality Date  . CARPAL TUNNEL RELEASE    . rotator cuff surgery   04/20/2016   right arm; [facility]   . TRIGGER FINGER RELEASE      Family History  Problem Relation Age of Onset  . Hypertension Father   . Heart disease Father   . Hypertension Brother   . Hypertension Sister     . Supraventricular tachycardia Daughter   . Heart disease Daughter   . Hypertension Brother   . Hypertension Brother   . Hypertension Brother   . Heart disease Brother     Social History   Tobacco Use  . Smoking status: Former Research scientist (life sciences)  . Smokeless tobacco: Never Used  Vaping Use  . Vaping Use: Never used  Substance Use Topics  . Alcohol use: Yes    Alcohol/week: 7.0 standard drinks    Types: 7 Glasses of wine per week  . Drug use: No     Allergies  Allergen Reactions  . Keflex [Cephalexin] Rash    Health Maintenance  Topic Date Due  . COLONOSCOPY  04/18/2023  . TETANUS/TDAP  09/28/2025  . INFLUENZA VACCINE  Completed  . COVID-19 Vaccine  Completed  . Hepatitis C Screening  Completed  . PNA vac Low Risk Adult  Completed    Chart Review Today: I personally reviewed active problem list, medication list, allergies, family history, social history, health maintenance, notes from last encounter, lab results, imaging with the patient/caregiver today.   Review of Systems  10 Systems reviewed and are negative for acute change except as noted in the HPI.  Objective:   Vitals:   01/07/20 0920  BP: 124/78  Pulse: 65  Resp: 18  Temp: 98.2 F (36.8 C)  TempSrc: Oral  SpO2: 98%  Weight: 194 lb 14.4 oz (88.4 kg)  Height: 5\' 9"  (1.753 m)    Body mass index is 28.78 kg/m.  Physical Exam Vitals and nursing note reviewed.  Constitutional:      General: He is not in acute distress.    Appearance: Normal appearance. He is well-developed. He is not ill-appearing, toxic-appearing or diaphoretic.     Interventions: Face mask in place.  HENT:     Head: Normocephalic and atraumatic.     Jaw: No trismus.     Right Ear: External ear normal.     Left Ear: External ear normal.  Eyes:     General: Lids are normal. No scleral icterus.       Right eye: No discharge.        Left eye: No discharge.     Conjunctiva/sclera: Conjunctivae normal.  Neck:     Trachea: Trachea and  phonation normal. No tracheal deviation.  Cardiovascular:     Rate and Rhythm: Normal rate and regular rhythm.     Pulses: Normal pulses.          Radial pulses are 2+ on the right side and 2+ on the left side.       Posterior tibial pulses are 2+ on the right side and 2+ on the left side.     Heart sounds: Normal heart sounds. No murmur heard.  No friction rub. No gallop.  Pulmonary:     Effort: Pulmonary effort is normal. No respiratory distress.     Breath sounds: Normal breath sounds. No stridor. No wheezing, rhonchi or rales.  Abdominal:     General: Bowel sounds are normal. There is no distension.     Palpations: Abdomen is soft.  Musculoskeletal:     Right lower leg: No edema.     Left lower leg: No edema.  Skin:    General: Skin is warm and dry.     Coloration: Skin is not jaundiced.     Findings: No rash.     Nails: There is no clubbing.  Neurological:     Mental Status: He is alert. Mental status is at baseline.     Cranial Nerves: No dysarthria or facial asymmetry.     Motor: No tremor or abnormal muscle tone.     Gait: Gait normal.  Psychiatric:        Mood and Affect: Mood normal.        Speech: Speech normal.        Behavior: Behavior normal. Behavior is cooperative.         Assessment & Plan:   1. Hyperlipidemia LDL goal <70 Compliant with statin and Zetia recheck labs patient was considering holding Zetia Continue to work on diet lifestyle efforts and exercising Patient will check labs next office visit after holding medication to see if LDL is still at goal - rosuvastatin (CRESTOR) 40 MG tablet; Take 1 tablet (40 mg total) by mouth daily.  Dispense: 90 tablet; Refill: 3  2. Essential hypertension Plan given to patient for switching medications for hypertension management.  Hypertension has been stable and well controlled his blood pressure was at goal today:  When you get the lisinopril start taking one in the morning and cut your atenolol pill in  half for one week. The second week stop the atenolol and continue taking the lisinopril  BP goal range would bee 100/60 to 130/80 You should know in about 2 weeks if the 10 mg dose will be correct for you to keep BP in range  We will increase to 20 mg lisinopril once daily if your BP is higher than 130/80 and we'll follow up on this in about a month.  - lisinopril (ZESTRIL) 10 MG tablet; Take 1 tablet (10 mg total) by mouth in the morning.  Dispense: 60 tablet; Refill: 0  3. Insomnia, unspecified type Difficulty sleeping, encourage sleep hygiene information to increase his exercise And possibly do a sleep study to evaluate  4. Idiopathic gout, unspecified chronicity, unspecified site Gout has been well controlled no gouty flare for several years we checked his uric acid earlier this year and is well controlled  5. History of pulmonary embolus (PE) Patient is no longer on NOAC and will no longer need to follow with heme-onc regarding this he has no recurrent symptoms  6. Need for influenza vaccination - Flu Vaccine QUAD High Dose(Fluad)  7. Encounter for medication monitoring And wished to defer labs till next office visit with med changes will order CMP and lipid in 3 to 4 months  8. Abdominal aortic atherosclerosis ([facility]) On statin, tolerating, monitoring - rosuvastatin (CRESTOR) 40 MG tablet; Take 1 tablet (40 mg total) by mouth daily.  Dispense: 90 tablet; Refill: 3  9. Hx of smoking - rosuvastatin (CRESTOR) 40 MG tablet; Take 1 tablet (40 mg total) by mouth daily.  Dispense: 90 tablet; Refill: 3   Return in about 1 month (  around 02/06/2020).  1 month closer follow-up for BP recheck   Delsa Grana, PA-C 01/07/20 9:27 AM

## 2020-01-07 ENCOUNTER — Ambulatory Visit: Payer: Medicare PPO | Admitting: Family Medicine

## 2020-01-07 ENCOUNTER — Encounter: Payer: Self-pay | Admitting: Family Medicine

## 2020-01-07 ENCOUNTER — Other Ambulatory Visit: Payer: Self-pay

## 2020-01-07 VITALS — BP 124/78 | HR 65 | Temp 98.2°F | Resp 18 | Ht 69.0 in | Wt 194.9 lb

## 2020-01-07 DIAGNOSIS — G4700 Insomnia, unspecified: Secondary | ICD-10-CM

## 2020-01-07 DIAGNOSIS — E785 Hyperlipidemia, unspecified: Secondary | ICD-10-CM

## 2020-01-07 DIAGNOSIS — Z5181 Encounter for therapeutic drug level monitoring: Secondary | ICD-10-CM

## 2020-01-07 DIAGNOSIS — I10 Essential (primary) hypertension: Secondary | ICD-10-CM

## 2020-01-07 DIAGNOSIS — Z23 Encounter for immunization: Secondary | ICD-10-CM

## 2020-01-07 DIAGNOSIS — I700 Atherosclerosis of aorta: Secondary | ICD-10-CM

## 2020-01-07 DIAGNOSIS — M1000 Idiopathic gout, unspecified site: Secondary | ICD-10-CM | POA: Diagnosis not present

## 2020-01-07 DIAGNOSIS — Z86711 Personal history of pulmonary embolism: Secondary | ICD-10-CM

## 2020-01-07 DIAGNOSIS — Z87891 Personal history of nicotine dependence: Secondary | ICD-10-CM

## 2020-01-07 MED ORDER — LISINOPRIL 10 MG PO TABS: 10.0000 mg | ORAL_TABLET | Freq: Every morning | ORAL | 0 refills | Status: DC

## 2020-01-07 MED ORDER — ROSUVASTATIN CALCIUM 40 MG PO TABS: 40.0000 mg | ORAL_TABLET | Freq: Every day | ORAL | 3 refills | Status: DC

## 2020-01-07 NOTE — Patient Instructions (Addendum)
IF you stop zetia I want to recheck you labs in about 3 months - come by the office and get your labs done.    How to Take Your Blood Pressure You can take your blood pressure at home with a machine. You may need to check your blood pressure at home:  To check if you have high blood pressure (hypertension).  To check your blood pressure over time.  To make sure your blood pressure medicine is working. Supplies needed: You will need a blood pressure machine, or monitor. You can buy one at a drugstore or online. When choosing one:  Choose one with an arm cuff.  Choose one that wraps around your upper arm. Only one finger should fit between your arm and the cuff.  Do not choose one that measures your blood pressure from your wrist or finger. Your doctor can suggest a monitor. How to prepare Avoid these things for 30 minutes before checking your blood pressure:  Drinking caffeine.  Drinking alcohol.  Eating.  Smoking.  Exercising. Five minutes before checking your blood pressure:  Pee.  Sit in a dining chair. Avoid sitting in a soft couch or armchair.  Be quiet. Do not talk. How to take your blood pressure Follow the instructions that came with your machine. If you have a digital blood pressure monitor, these may be the instructions: 1. Sit up straight. 2. Place your feet on the floor. Do not cross your ankles or legs. 3. Rest your left arm at the level of your heart. You may rest it on a table, desk, or chair. 4. Pull up your shirt sleeve. 5. Wrap the blood pressure cuff around the upper part of your left arm. The cuff should be 1 inch (2.5 cm) above your elbow. It is best to wrap the cuff around bare skin. 6. Fit the cuff snugly around your arm. You should be able to place only one finger between the cuff and your arm. 7. Put the cord inside the groove of your elbow. 8. Press the power button. 9. Sit quietly while the cuff fills with air and loses air. 10. Write  down the numbers on the screen. 11. Wait 2-3 minutes and then repeat steps 1-10. What do the numbers mean? Two numbers make up your blood pressure. The first number is called systolic pressure. The second is called diastolic pressure. An example of a blood pressure reading is "120 over 80" (or 120/80). If you are an adult and do not have a medical condition, use this guide to find out if your blood pressure is normal: Normal  First number: below 120.  Second number: below 80. Elevated  First number: 120-129.  Second number: below 80. Hypertension stage 1  First number: 130-139.  Second number: 80-89. Hypertension stage 2  First number: 140 or above.  Second number: 79 or above. Your blood pressure is above normal even if only the top or bottom number is above normal. Follow these instructions at home:  Check your blood pressure as often as your doctor tells you to.  Take your monitor to your next doctor's appointment. Your doctor will: ? Make sure you are using it correctly. ? Make sure it is working right.  Make sure you understand what your blood pressure numbers should be.  Tell your doctor if your medicines are causing side effects. Contact a doctor if:  Your blood pressure keeps being high. Get help right away if:  Your first blood  pressure number is higher than 180.  Your second blood pressure number is higher than 120. This information is not intended to replace advice given to you by your health care provider. Make sure you discuss any questions you have with your health care provider. Document Revised: 03/16/2017 Document Reviewed: 09/10/2015 Elsevier Patient Education  2020 [employer].

## 2020-01-08 ENCOUNTER — Telehealth: Payer: Medicare PPO

## 2020-01-13 ENCOUNTER — Telehealth: Payer: Self-pay | Admitting: *Deleted

## 2020-01-13 NOTE — Chronic Care Management (AMB) (Signed)
  Care Management   Note  01/13/2020 Name: [name] MRN: [id number] DOB: 12-Aug-1950  Victor Knight is a 69 y.o. year old male who is a primary care patient of Laurell Roof and is actively engaged with the care management team. I reached out to [name] by phone today to assist with re-scheduling a follow up visit with the Pharmacist.  Follow up plan: Unsuccessful telephone outreach attempt made. A HIPAA compliant phone message was left for the patient providing contact information and requesting a return call.  The care management team will reach out to the patient again over the next 7 days.  If patient returns call to provider office, please advise to call [place] at [phone].  [facility] Management

## 2020-01-14 NOTE — Chronic Care Management (AMB) (Signed)
  Care Management   Note  01/14/2020 Name: Victor Knight MRN: [id number] DOB: 1951-02-04  Victor Knight is a 69 y.o. year old male who is a primary care patient of Laurell Roof and is actively engaged with the care management team. I reached out to [name] by phone today to assist with re-scheduling a follow up visit with the Pharmacist.  Follow up plan: Telephone appointment with care management team member scheduled for:01/28/2020  [facility] Management

## 2020-01-28 ENCOUNTER — Telehealth: Payer: Medicare PPO

## 2020-02-05 ENCOUNTER — Encounter: Payer: Self-pay | Admitting: Family Medicine

## 2020-02-05 ENCOUNTER — Telehealth (INDEPENDENT_AMBULATORY_CARE_PROVIDER_SITE_OTHER): Payer: Medicare PPO | Admitting: Family Medicine

## 2020-02-05 VITALS — BP 138/87 | HR 90 | Ht 69.0 in | Wt 194.0 lb

## 2020-02-05 DIAGNOSIS — I10 Essential (primary) hypertension: Secondary | ICD-10-CM | POA: Diagnosis not present

## 2020-02-05 DIAGNOSIS — E785 Hyperlipidemia, unspecified: Secondary | ICD-10-CM | POA: Diagnosis not present

## 2020-02-05 MED ORDER — LISINOPRIL 20 MG PO TABS: 20.0000 mg | ORAL_TABLET | Freq: Every morning | ORAL | 1 refills | Status: DC

## 2020-02-05 NOTE — Progress Notes (Signed)
Name: Victor Knight   MRN: [id number]    DOB: [birth year]   Date:02/05/2020       Progress Note  Subjective  Chief Complaint Chief Complaint  Patient presents with  . Follow-up    I connected with  Victor Knight  on 02/05/20 at 10:00 AM EDT by a video enabled telemedicine application and verified that I am speaking with the correct person using two identifiers.  I discussed the limitations of evaluation and management by telemedicine and the availability of in person appointments. The patient expressed understanding and agreed to proceed. Staff also discussed with the patient that there may be a patient responsible charge related to this service. Patient Location:  home Provider Location: [facility] Additional Individuals present:none  HPI  HTN- stopped atenolol, decreased dose first for a week and then d/c Started lisinopril 10 mg daily  He has been monitoring BP for the past 3 weeks BP check in the morning before taking   138/87 124/80 139/89 143/83 141/86 133/85 138/82 148/84 128/76 Some reading later in the day   Pt denies CP, SOB, exertional sx, LE edema, palpitation, Ha's, visual disturbances, lightheadedness, hypotension, syncope.     Lab Results  Component Value Date   CREATININE 1.02 08/27/2019   Lab Results  Component Value Date   BUN 17 08/27/2019   Lab Results  Component Value Date   GFRNONAA 75 08/27/2019     Chemistry      Component Value Date/Time   NA 141 08/27/2019 0936   K 4.5 08/27/2019 0936   CL 107 08/27/2019 0936   CO2 27 08/27/2019 0936   BUN 17 08/27/2019 0936   CREATININE 1.02 08/27/2019 0936      Component Value Date/Time   CALCIUM 9.0 08/27/2019 0936   ALKPHOS 39 04/28/2019 0256   AST 28 08/27/2019 0936   ALT 28 08/27/2019 0936   BILITOT 0.6 08/27/2019 0936     Normal renal function  HLD still taking crestor and zetia - he plans to use up  Lab Results  Component Value Date   CHOL 138 08/27/2019   HDL 62  08/27/2019   LDLCALC 62 08/27/2019   TRIG 62 08/27/2019   CHOLHDL 2.2 08/27/2019      Pt was experiencing fatigue, was low testosterone - not replacing due to risk with recent PE Fatigue not much changed  He is not exercising like he was over a year ago, bone spurs in foot limiting activity, pain with just walking he is seeing specialist for this   Patient Active Problem List   Diagnosis Date Noted  . Bilateral pulmonary embolism ([place]) 04/28/2019  . Plantar fasciitis of left foot 03/31/2019  . Medicare annual wellness visit, subsequent 07/03/2017  . Tendinitis of shoulder 04/09/2017  . Bursitis of shoulder 04/09/2017  . Shoulder pain 04/09/2017  . Adhesive capsulitis of shoulder 04/09/2017  . Dupuytren contracture 04/09/2017  . Complete tear of right rotator cuff 04/20/2016  . Preventative health care 12/05/2015  . Medication monitoring encounter 11/02/2015  . Abdominal aortic atherosclerosis ([place]) 10/27/2015  . Aortic ectasia, abdominal ([place]) 10/27/2015  . Hx of smoking 10/05/2015  . History of hepatitis C 10/05/2015  . Prostate cancer screening 10/05/2015  . Disease of liver 09/29/2015  . Hypertension 10/05/2014  . Insomnia 10/05/2014  . Hyperlipidemia LDL goal <70 10/05/2014  . Gout 10/05/2014    Past Surgical History:  Procedure Laterality Date  . CARPAL TUNNEL RELEASE    . rotator cuff surgery  04/20/2016   right arm; [facility]   . TRIGGER FINGER RELEASE      Family History  Problem Relation Age of Onset  . Hypertension Father   . Heart disease Father   . Hypertension Brother   . Hypertension Sister   . Supraventricular tachycardia Daughter   . Heart disease Daughter   . Hypertension Brother   . Hypertension Brother   . Hypertension Brother   . Heart disease Brother     Social History   Socioeconomic History  . Marital status: Married    Spouse name: [name]  . Number of children: 1  . Years of education: Not on file  . Highest education level:  High school graduate  Occupational History  . Occupation: Retired  Tobacco Use  . Smoking status: Former Research scientist (life sciences)  . Smokeless tobacco: Never Used  Vaping Use  . Vaping Use: Never used  Substance and Sexual Activity  . Alcohol use: Yes    Alcohol/week: 7.0 standard drinks    Types: 7 Glasses of wine per week  . Drug use: No  . Sexual activity: Yes    Partners: Female  Other Topics Concern  . Not on file  Social History Narrative  . Not on file   Social Determinants of Health   Financial Resource Strain:   . Difficulty of Paying Living Expenses: Not on file  Food Insecurity:   . Worried About Charity fundraiser in the Last Year: Not on file  . Ran Out of Food in the Last Year: Not on file  Transportation Needs:   . Lack of Transportation (Medical): Not on file  . Lack of Transportation (Non-Medical): Not on file  Physical Activity: Inactive  . Days of Exercise per Week: 0 days  . Minutes of Exercise per Session: 0 min  Stress:   . Feeling of Stress : Not on file  Social Connections:   . Frequency of Communication with Friends and Family: Not on file  . Frequency of Social Gatherings with Friends and Family: Not on file  . Attends Religious Services: Not on file  . Active Member of Clubs or Organizations: Not on file  . Attends Archivist Meetings: Not on file  . Marital Status: Not on file  Intimate Partner Violence:   . Fear of Current or Ex-Partner: Not on file  . Emotionally Abused: Not on file  . Physically Abused: Not on file  . Sexually Abused: Not on file     Current Outpatient Medications:  .  allopurinol (ZYLOPRIM) 100 MG tablet, Take 1 tablet (100 mg total) by mouth daily., Disp: 90 tablet, Rfl: 3 .  lisinopril (ZESTRIL) 20 MG tablet, Take 1 tablet (20 mg total) by mouth in the morning., Disp: 90 tablet, Rfl: 1 .  LUMIGAN 0.01 % SOLN, Place 1 drop into the right eye at bedtime. , Disp: , Rfl: 0 .  Multiple Vitamin (MULTIVITAMIN) tablet, Take 1  tablet by mouth daily., Disp: , Rfl:  .  [START ON 02/16/2020] rosuvastatin (CRESTOR) 40 MG tablet, Take 1 tablet (40 mg total) by mouth daily., Disp: 90 tablet, Rfl: 3 .  traZODone (DESYREL) 100 MG tablet, TAKE 1/2 TO 1 TABLET BY MOUTH AT BEDTIME AS NEEDED FOR SLEEP, Disp: 90 tablet, Rfl: 3 .  meloxicam (MOBIC) 15 MG tablet, , Disp: , Rfl:   Allergies  Allergen Reactions  . Keflex [Cephalexin] Rash   Chart review: I personally reviewed active problem list, medication list, allergies, family history, social  history, health maintenance, notes from last encounter, lab results, imaging with the patient/caregiver today.    ROS  10 Systems reviewed and are negative for acute change except as noted in the HPI.   Objectveiv. Today's Vitals   02/05/20 0908  BP: 138/87  Pulse: 90  Weight: 194 lb (88 kg)  Height: 5\' 9"  (1.753 m)  PainSc: 0-No pain   Body mass index is 28.65 kg/m.     Physical Exam Vitals and nursing note reviewed.  Neurological:     Mental Status: He is alert.  Psychiatric:        Mood and Affect: Mood normal.      No results found for this or any previous visit (from the past 72 hour(s)).    Fall Risk: Fall Risk  02/05/2020 01/07/2020 08/22/2019 07/07/2019 05/22/2019  Falls in the past year? 0 0 0 0 0  Number falls in past yr: 0 0 0 0 0  Injury with Fall? 0 0 0 0 0  Follow up - Falls evaluation completed - - -     Assessment & Plan  1. Essential hypertension Pt stopped atenolol, no sx or SE coming off of this He denies palpitations, CP, SOB, near syncope, racing heart - he has monitored HR and it was 50-60's now 80-90's BP average is >130/80, many readings in the past week >140 SBP Will increase lisinopril to 20 mg and pt to come in person for f/up with labs - lisinopril (ZESTRIL) 20 MG tablet; Take 1 tablet (20 mg total) by mouth in the morning.  Dispense: 90 tablet; Refill: 1  2. Hyperlipidemia LDL goal <70 He is going to hold zetia when out of  last refill/rx and then recheck Lipid panel - with a desire to minimize meds and LDL in 60's  Return in about 11 weeks (around 04/22/2020) for HTN HLD med f/up labs, come in sooner as needed for blood pressure check.   I discussed the assessment and treatment plan with the patient. The patient was provided an opportunity to ask questions and all were answered. The patient agreed with the plan and demonstrated an understanding of the instructions.  The patient was advised to call back or seek an in-person evaluation if the symptoms worsen or if the condition fails to improve as anticipated.  I provided 20+ minutes of non-face-to-face time during this encounter.

## 2020-02-06 ENCOUNTER — Other Ambulatory Visit: Payer: Self-pay | Admitting: Family Medicine

## 2020-02-06 DIAGNOSIS — Z87891 Personal history of nicotine dependence: Secondary | ICD-10-CM

## 2020-02-06 DIAGNOSIS — E785 Hyperlipidemia, unspecified: Secondary | ICD-10-CM

## 2020-02-06 DIAGNOSIS — I700 Atherosclerosis of aorta: Secondary | ICD-10-CM

## 2020-02-24 ENCOUNTER — Ambulatory Visit: Payer: Medicare PPO

## 2020-02-24 DIAGNOSIS — E785 Hyperlipidemia, unspecified: Secondary | ICD-10-CM

## 2020-02-24 DIAGNOSIS — I10 Essential (primary) hypertension: Secondary | ICD-10-CM

## 2020-02-24 NOTE — Chronic Care Management (AMB) (Signed)
Chronic Care Management Pharmacy  Name: Victor Knight  MRN: [id number] DOB: April 17, 1951   Chief Complaint/ HPI  Victor Knight,  69 y.o. , male presents for their Follow-Up CCM visit with the clinical pharmacist via telephone.  PCP : Victor Grana, PA-C Patient Care Team: Victor Grana, PA-C as PCP - General (Family Medicine) Victor Bathe, MD (Dermatology) Victor Cotta, MD (Ophthalmology) Victor Knight, [facility] as Pharmacist (Pharmacist)  Their chronic conditions include: Hypertension and Hyperlipidemia   Office Visits: 02/05/20: Patient presented to Victor Grana, PA-C for follow-up. Lisinopril increased to 20 mg daily, Zetia stopped.  01/07/20: Patient presented to Victor Grana, PA-C for follow-up. Apixaban, atenolol, ezetimibe stopped. Lisinopril 10 mg daily started.   Consult Visit: 10/08/19: Patient presented to Dr. Bernardo Knight (Urology) for follow-up.   Allergies  Allergen Reactions   Keflex [Cephalexin] Rash    Medications: Outpatient Encounter Medications as of 02/24/2020  Medication Sig   allopurinol (ZYLOPRIM) 100 MG tablet Take 1 tablet (100 mg total) by mouth daily.   lisinopril (ZESTRIL) 20 MG tablet Take 1 tablet (20 mg total) by mouth in the morning.   LUMIGAN 0.01 % SOLN Place 1 drop into the right eye at bedtime.    meloxicam (MOBIC) 15 MG tablet  (Patient not taking: Reported on 02/05/2020)   Multiple Vitamin (MULTIVITAMIN) tablet Take 1 tablet by mouth daily.   rosuvastatin (CRESTOR) 40 MG tablet TAKE 1 TABLET(40 MG) BY MOUTH DAILY   traZODone (DESYREL) 100 MG tablet TAKE 1/2 TO 1 TABLET BY MOUTH AT BEDTIME AS NEEDED FOR SLEEP   No facility-administered encounter medications on file as of 02/24/2020.    Wt Readings from Last 3 Encounters:  02/05/20 194 lb (88 kg)  01/07/20 194 lb 14.4 oz (88.4 kg)  10/08/19 195 lb (88.5 kg)    Current Diagnosis/Assessment:  SDOH Interventions     Most Recent Value  SDOH Interventions    Financial Strain Interventions Intervention Not Indicated  Transportation Interventions Intervention Not Indicated      Goals Addressed            This Visit's Progress    Chronic Care Management       CARE PLAN ENTRY  Current Barriers:   Chronic Disease Management support, education, and care coordination needs related to Hypertension and Hyperlipidemia   Hypertension  Pharmacist Clinical Goal(s): o Over the next 90 days, patient will work with PharmD and providers to maintain BP goal <130/80  Current regimen:  o Lisinopril 20 mg daily  Patient self care activities - Over the next 90 days, patient will: o Check BP 3-5 times weekly, document, and provide at future appointments o Ensure daily salt intake < 2300 mg/day  Hyperlipidemia  Pharmacist Clinical Goal(s): o Over the next 90 days, patient will work with PharmD and providers to maintain LDL goal < 70  Current regimen:  o Crestor $RemoveB'40mg'vCPWMiKc$  daily  Medication management  Pharmacist Clinical Goal(s): o Over the next 90 days, patient will work with PharmD and providers to maintain optimal medication adherence  Current pharmacy: [facility]  Interventions o Comprehensive medication review performed. o Continue current medication management strategy  Patient self care activities - Over the next 90 days, patient will: o Report any questions or concerns to PharmD and/or provider(s)      Hypertension   BP goal is:  <130/80  Office blood pressures are  BP Readings from Last 3 Encounters:  02/05/20 138/87  01/07/20 124/78  10/08/19 (!) 151/80  Patient checks BP at home 3-5x per week Patient home BP readings are ranging: Low 130s/80s (prior to BP medications)   Patient has failed these meds in the past: n/a Patient is currently controlled on the following medications:   Lisinopril 20 mg daily   We discussed: Cut out red meat. Patient has a bone spur that limits activity. Counseled patient on staying active  as much as possible (chair exercises, stretches)  Plan  Continue current medications    Hyperlipidemia   LDL goal < 70  Last lipids Lab Results  Component Value Date   CHOL 138 08/27/2019   HDL 62 08/27/2019   LDLCALC 62 08/27/2019   TRIG 62 08/27/2019   CHOLHDL 2.2 08/27/2019   Hepatic Function Latest Ref Rng & Units 08/27/2019 07/02/2019 05/09/2019  Total Protein 6.1 - 8.1 g/dL 6.4 6.5 6.6  Albumin 3.5 - 5.0 g/dL - - -  AST 10 - 35 U/L 28 28 56(H)  ALT 9 - 46 U/L 28 29 78(H)  Alk Phosphatase 38 - 126 U/L - - -  Total Bilirubin 0.2 - 1.2 mg/dL 0.6 0.7 0.4     The 10-year ASCVD risk score Victor Knight., et al., 2013) is: 16.5%   Values used to calculate the score:     Age: 36 years     Sex: Male     Is Non-Hispanic African American: No     Diabetic: No     Tobacco smoker: No     Systolic Blood Pressure: 220 mmHg     Is BP treated: Yes     HDL Cholesterol: 62 mg/dL     Total Cholesterol: 138 mg/dL   Patient has failed these meds in past: n/a Patient is currently controlled on the following medications:   Rosuvastatin 40 mg daily   We discussed:  diet and exercise extensively  Plan  Continue current medications  Gout   Uric Acid, Serum  Date Value Ref Range Status  08/27/2019 4.9 4.0 - 8.0 mg/dL Final    Comment:    Therapeutic target for gout patients: <6.0 mg/dL .   02/26/2019 6.2 4.0 - 8.0 mg/dL Final    Comment:    Therapeutic target for gout patients: <6.0 mg/dL .   10/24/2017 6.0 4.0 - 8.0 mg/dL Final    Comment:    Therapeutic target for gout patients: <6.0 mg/dL .      Goal Uric Acid < 6 mg/dL   Medications that may increase uric acid levels: None  Last gout flare: 4-5 years prior.   Patient has failed these meds in past: n/a Patient is currently controlled on the following medications:   Allopurinol 100 mg daily   We discussed:  Counseled patient on low purine diet plan. Counseled patient to reduce consumption of high-fructose corn  syrup, sweetened soft drinks, fruit juices, meat, and seafood.  Plan  Continue current medications  Misc / OTC     Lumgian 0.01% Soln   Meloxicam 15 mg   Multivitamin   Trazodone 100 mg 1/2-1 tablet QHS PRN   Plan  Continue current medications   Medication Management   Pt uses [facility] for all medications Uses pill box? Yes Pt endorses 100% compliance  Plan  Continue current medication management strategy    Follow up: 6 month phone visit  [place] [phone]

## 2020-02-28 ENCOUNTER — Other Ambulatory Visit: Payer: Self-pay | Admitting: Family Medicine

## 2020-02-28 DIAGNOSIS — M1000 Idiopathic gout, unspecified site: Secondary | ICD-10-CM

## 2020-03-05 ENCOUNTER — Other Ambulatory Visit: Payer: Self-pay

## 2020-03-05 ENCOUNTER — Ambulatory Visit: Payer: Medicare PPO

## 2020-03-05 VITALS — BP 132/80

## 2020-03-05 DIAGNOSIS — Z0130 Encounter for examination of blood pressure without abnormal findings: Secondary | ICD-10-CM

## 2020-03-09 ENCOUNTER — Ambulatory Visit (INDEPENDENT_AMBULATORY_CARE_PROVIDER_SITE_OTHER): Payer: Medicare PPO

## 2020-03-09 DIAGNOSIS — Z0000 Encounter for general adult medical examination without abnormal findings: Secondary | ICD-10-CM

## 2020-03-09 NOTE — Progress Notes (Signed)
Subjective:   Victor Knight is a 69 y.o. male who presents for Medicare Annual/Subsequent preventive examination.  Virtual Visit via Telephone Note  I connected with  Victor Knight on 03/09/20 at  1:30 PM EST by telephone and verified that I am speaking with the correct person using two identifiers.  Location: Patient: home Provider: [place] Persons participating in the virtual visit: [name]   I discussed the limitations, risks, security and privacy concerns of performing an evaluation and management service by telephone and the availability of in person appointments. The patient expressed understanding and agreed to proceed.  Interactive audio and video telecommunications were attempted between this nurse and patient, however failed, due to patient having technical difficulties OR patient did not have access to video capability.  We continued and completed visit with audio only.  Some vital signs may be absent or patient reported.   Clemetine Marker, LPN    Review of Systems     Cardiac Risk Factors include: advanced age (>62men, >70 women);dyslipidemia;male gender;hypertension     Objective:    Today's Vitals   03/09/20 1342  PainSc: 0-No pain   There is no height or weight on file to calculate BMI.  Advanced Directives 03/09/2020 06/17/2019 05/16/2019 04/28/2019 03/07/2019 02/12/2017 08/11/2016  Does Patient Have a Medical Advance Directive? Yes Yes Yes Yes Yes Yes Yes  Type of Paramedic of [place];Living will [place];Living will [place];Living will [place];Living will [place];Living will - -  Does patient want to make changes to medical advance directive? - No - Patient declined No - Patient declined - - - -  Copy of [place] in Chart? Yes - validated most recent copy scanned in chart (See row information) No - copy requested  No - copy requested - Yes - validated most recent copy scanned in chart (See row information) - -    Current Medications (verified) Outpatient Encounter Medications as of 03/09/2020  Medication Sig  . allopurinol (ZYLOPRIM) 100 MG tablet TAKE 1 TABLET(100 MG) BY MOUTH DAILY  . lisinopril (ZESTRIL) 20 MG tablet Take 1 tablet (20 mg total) by mouth in the morning.  Marland Kitchen LUMIGAN 0.01 % SOLN Place 1 drop into the right eye at bedtime.   . Multiple Vitamin (MULTIVITAMIN) tablet Take 1 tablet by mouth daily.  . rosuvastatin (CRESTOR) 40 MG tablet TAKE 1 TABLET(40 MG) BY MOUTH DAILY  . traZODone (DESYREL) 100 MG tablet TAKE 1/2 TO 1 TABLET BY MOUTH AT BEDTIME AS NEEDED FOR SLEEP  . [DISCONTINUED] meloxicam (MOBIC) 15 MG tablet  (Patient not taking: Reported on 02/05/2020)   No facility-administered encounter medications on file as of 03/09/2020.    Allergies (verified) Keflex [cephalexin]   History: Past Medical History:  Diagnosis Date  . Abdominal aortic atherosclerosis ([place]) 10/27/2015  . Aortic ectasia, abdominal ([place]) 10/27/2015  . Gout   . Hyperlipidemia   . Hypertension   . Insomnia    Past Surgical History:  Procedure Laterality Date  . CARPAL TUNNEL RELEASE    . rotator cuff surgery   04/20/2016   right arm; [facility]   . TRIGGER FINGER RELEASE     Family History  Problem Relation Age of Onset  . Hypertension Father   . Heart disease Father   . Hypertension Brother   . Hypertension Sister   . Supraventricular tachycardia Daughter   . Heart disease Daughter   . Hypertension  Brother   . Hypertension Brother   . Hypertension Brother   . Heart disease Brother    Social History   Socioeconomic History  . Marital status: Married    Spouse name: [name]  . Number of children: 1  . Years of education: Not on file  . Highest education level: High school graduate  Occupational History  . Occupation: Retired  Tobacco Use  . Smoking status: Former Research scientist (life sciences)  . Smokeless  tobacco: Never Used  Vaping Use  . Vaping Use: Never used  Substance and Sexual Activity  . Alcohol use: Yes    Alcohol/week: 7.0 standard drinks    Types: 7 Glasses of wine per week  . Drug use: No  . Sexual activity: Yes    Partners: Female  Other Topics Concern  . Not on file  Social History Narrative  . Not on file   Social Determinants of Health   Financial Resource Strain: Low Risk   . Difficulty of Paying Living Expenses: Not hard at all  Food Insecurity: No Food Insecurity  . Worried About Charity fundraiser in the Last Year: Never true  . Ran Out of Food in the Last Year: Never true  Transportation Needs: No Transportation Needs  . Lack of Transportation (Medical): No  . Lack of Transportation (Non-Medical): No  Physical Activity: Inactive  . Days of Exercise per Week: 0 days  . Minutes of Exercise per Session: 0 min  Stress: No Stress Concern Present  . Feeling of Stress : Not at all  Social Connections: Socially Integrated  . Frequency of Communication with Friends and Family: More than three times a week  . Frequency of Social Gatherings with Friends and Family: Three times a week  . Attends Religious Services: More than 4 times per year  . Active Member of Clubs or Organizations: Yes  . Attends Archivist Meetings: More than 4 times per year  . Marital Status: Married    Tobacco Counseling Counseling given: Not Answered   Clinical Intake:  Pre-visit preparation completed: Yes  Pain : No/denies pain Pain Score: 0-No pain     Nutritional Risks: None Diabetes: No  How often do you need to have someone help you when you read instructions, pamphlets, or other written materials from your doctor or pharmacy?: 1 - Never    Interpreter Needed?: No  Information entered by :: Clemetine Marker LPN   Activities of Daily Living In your present state of health, do you have any difficulty performing the following activities: 03/09/2020 02/05/2020    Hearing? N N  Comment declines hearing aids -  Vision? N N  Difficulty concentrating or making decisions? N N  Walking or climbing stairs? N N  Dressing or bathing? N N  Doing errands, shopping? N N  Preparing Food and eating ? N -  Using the Toilet? N -  In the past six months, have you accidently leaked urine? N -  Do you have problems with loss of bowel control? N -  Managing your Medications? N -  Managing your Finances? N -  Housekeeping or managing your Housekeeping? N -  Some recent data might be hidden    Patient Care Team: Delsa Grana, PA-C as PCP - General (Family Medicine) Ralene Bathe, MD (Dermatology) Estill Cotta, MD (Ophthalmology) Germaine Pomfret, [facility] as Pharmacist (Pharmacist)  Indicate any recent Medical Services you may have received from other than [facility] providers in the past year (date may be  approximate).     Assessment:   This is a routine wellness examination for [place].  Hearing/Vision screen  Hearing Screening   125Hz  250Hz  500Hz  1000Hz  2000Hz  3000Hz  4000Hz  6000Hz  8000Hz   Right ear:           Left ear:           Comments: Pt denies hearing difficulty  Vision Screening Comments: [facility] screenings done at [facility]  Dietary issues and exercise activities discussed: Current Exercise Habits: The patient does not participate in regular exercise at present, Exercise limited by: orthopedic condition(s)  Goals    .  Chronic Care Management      CARE PLAN ENTRY  Current Barriers:  . Chronic Disease Management support, education, and care coordination needs related to Hypertension and Hyperlipidemia   Hypertension . Pharmacist Clinical Goal(s): o Over the next 90 days, patient will work with PharmD and providers to maintain BP goal <130/80 . Current regimen:  o Lisinopril 20 mg daily . Patient self care activities - Over the next 90 days, patient will: o Check BP 3-5 times weekly, document, and provide at future  appointments o Ensure daily salt intake < 2300 mg/day  Hyperlipidemia . Pharmacist Clinical Goal(s): o Over the next 90 days, patient will work with PharmD and providers to maintain LDL goal < 70 . Current regimen:  o Crestor 40mg  daily  Medication management . Pharmacist Clinical Goal(s): o Over the next 90 days, patient will work with PharmD and providers to maintain optimal medication adherence . Current pharmacy: [facility] . Interventions o Comprehensive medication review performed. o Continue current medication management strategy . Patient self care activities - Over the next 90 days, patient will: o Report any questions or concerns to PharmD and/or provider(s)    .  DIET - INCREASE WATER INTAKE (pt-stated)      Depression Screen PHQ 2/9 Scores 03/09/2020 02/05/2020 01/07/2020 08/22/2019 07/07/2019 05/22/2019 05/09/2019  PHQ - 2 Score 0 0 0 0 0 0 0  PHQ- 9 Score - 0 - 0 0 0 0    Fall Risk Fall Risk  03/09/2020 02/05/2020 01/07/2020 08/22/2019 07/07/2019  Falls in the past year? 0 0 0 0 0  Number falls in past yr: 0 0 0 0 0  Injury with Fall? 0 0 0 0 0  Risk for fall due to : No Fall Risks - - - -  Follow up Falls prevention discussed - Falls evaluation completed - -    Any stairs in or around the home? Yes  If so, are there any without handrails? No  Home free of loose throw rugs in walkways, pet beds, electrical cords, etc? Yes  Adequate lighting in your home to reduce risk of falls? Yes   ASSISTIVE DEVICES UTILIZED TO PREVENT FALLS:  Life alert? No  Use of a cane, walker or w/c? No  Grab bars in the bathroom? No  Shower chair or bench in shower? Yes  Elevated toilet seat or a handicapped toilet? Yes   TIMED UP AND GO:  Was the test performed? No . Telephonic visit.   Cognitive Function: Normal cognitive status assessed by direct observation by this Nurse Health Advisor.       6CIT Screen 07/03/2017  What Year? 0 points  What month? 0 points  What time? 0 points   Count back from 20 0 points  Months in reverse 0 points  Repeat phrase 0 points  Total Score 0    Immunizations Immunization History  Administered Date(s) Administered  . Fluad Quad(high Dose 65+) 01/23/2019, 01/07/2020  . Influenza, High Dose Seasonal PF 01/08/2017, 02/02/2018  . Influenza-Unspecified 12/17/2015  . Moderna SARS-COVID-2 Vaccination 05/15/2019, 06/12/2019, 02/12/2020  . Pneumococcal Conjugate-13 10/05/2015, 01/08/2017  . Pneumococcal Polysaccharide-23 02/28/2019  . Tdap 09/29/2015    TDAP status: Up to date   Flu Vaccine status: Up to date   Pneumococcal vaccine status: Up to date   Covid-19 vaccine status: Completed vaccines  Qualifies for Shingles Vaccine? Yes   Zostavax completed No   Shingrix Completed?: No.    Education has been provided regarding the importance of this vaccine. Patient has been advised to call insurance company to determine out of pocket expense if they have not yet received this vaccine. Advised may also receive vaccine at local pharmacy or Health Dept. Verbalized acceptance and understanding.  Screening Tests Health Maintenance  Topic Date Due  . COLONOSCOPY  04/18/2023  . TETANUS/TDAP  09/28/2025  . INFLUENZA VACCINE  Completed  . COVID-19 Vaccine  Completed  . Hepatitis C Screening  Completed  . PNA vac Low Risk Adult  Completed    Health Maintenance  There are no preventive care reminders to display for this patient.  Colorectal cancer screening: Completed 2015. Repeat every 10 years  Lung Cancer Screening: (Low Dose CT Chest recommended if Age 32-80 years, 30 pack-year currently smoking OR have quit w/in 15years.) does not qualify.   Additional Screening:  Hepatitis C Screening: does qualify; Completed 02/26/19  Vision Screening: Recommended annual ophthalmology exams for early detection of glaucoma and other disorders of the eye. Is the patient up to date with their annual eye exam?  Yes  Who is the provider or  what is the name of the office in which the patient attends annual eye exams? [place] Screening: Recommended annual dental exams for proper oral hygiene  Community Resource Referral / Chronic Care Management: CRR required this visit?  No   CCM required this visit?  No      Plan:     I have personally reviewed and noted the following in the patient's chart:   . Medical and social history . Use of alcohol, tobacco or illicit drugs  . Current medications and supplements . Functional ability and status . Nutritional status . Physical activity . Advanced directives . List of other physicians . Hospitalizations, surgeries, and ER visits in previous 12 months . Vitals . Screenings to include cognitive, depression, and falls . Referrals and appointments  In addition, I have reviewed and discussed with patient certain preventive protocols, quality metrics, and best practice recommendations. A written personalized care plan for preventive services as well as general preventive health recommendations were provided to patient.     Clemetine Marker, LPN   [id number]   Nurse Notes: none

## 2020-03-09 NOTE — Patient Instructions (Signed)
Mr. Victor Knight , Thank you for taking time to come for your Medicare Wellness Visit. I appreciate your ongoing commitment to your health goals. Please review the following plan we discussed and let me know if I can assist you in the future.   Screening recommendations/referrals: Colonoscopy: done 2015. Due 2025 Recommended yearly ophthalmology/optometry visit for glaucoma screening and checkup Recommended yearly dental visit for hygiene and checkup  Vaccinations: Influenza vaccine: done 01/07/20 Pneumococcal vaccine: done 02/28/19 Tdap vaccine: done 09/29/15 Shingles vaccine: ,kushin Covid-19: done 05/15/19, 06/12/19 & 02/12/20  Conditions/risks identified: Recommend increasing physical activity as tolerated  Next appointment: Follow up in one year for your annual wellness visit.   Preventive Care 13 Years and Older, Male Preventive care refers to lifestyle choices and visits with your health care provider that can promote health and wellness. What does preventive care include?  A yearly physical exam. This is also called an annual well check.  Dental exams once or twice a year.  Routine eye exams. Ask your health care provider how often you should have your eyes checked.  Personal lifestyle choices, including:  Daily care of your teeth and gums.  Regular physical activity.  Eating a healthy diet.  Avoiding tobacco and drug use.  Limiting alcohol use.  Practicing safe sex.  Taking low doses of aspirin every day.  Taking vitamin and mineral supplements as recommended by your health care provider. What happens during an annual well check? The services and screenings done by your health care provider during your annual well check will depend on your age, overall health, lifestyle risk factors, and family history of disease. Counseling  Your health care provider may ask you questions about your:  Alcohol use.  Tobacco use.  Drug use.  Emotional well-being.  Home  and relationship well-being.  Sexual activity.  Eating habits.  History of falls.  Memory and ability to understand (cognition).  Work and work Statistician. Screening  You may have the following tests or measurements:  Height, weight, and BMI.  Blood pressure.  Lipid and cholesterol levels. These may be checked every 5 years, or more frequently if you are over 21 years old.  Skin check.  Lung cancer screening. You may have this screening every year starting at age 61 if you have a 30-pack-year history of smoking and currently smoke or have quit within the past 15 years.  Fecal occult blood test (FOBT) of the stool. You may have this test every year starting at age 15.  Flexible sigmoidoscopy or colonoscopy. You may have a sigmoidoscopy every 5 years or a colonoscopy every 10 years starting at age 69.  Prostate cancer screening. Recommendations will vary depending on your family history and other risks.  Hepatitis C blood test.  Hepatitis B blood test.  Sexually transmitted disease (STD) testing.  Diabetes screening. This is done by checking your blood sugar (glucose) after you have not eaten for a while (fasting). You may have this done every 1-3 years.  Abdominal aortic aneurysm (AAA) screening. You may need this if you are a current or former smoker.  Osteoporosis. You may be screened starting at age 53 if you are at high risk. Talk with your health care provider about your test results, treatment options, and if necessary, the need for more tests. Vaccines  Your health care provider may recommend certain vaccines, such as:  Influenza vaccine. This is recommended every year.  Tetanus, diphtheria, and acellular pertussis (Tdap, Td) vaccine. You may need a Td booster  every 10 years.  Zoster vaccine. You may need this after age 47.  Pneumococcal 13-valent conjugate (PCV13) vaccine. One dose is recommended after age 73.  Pneumococcal polysaccharide (PPSV23) vaccine.  One dose is recommended after age 83. Talk to your health care provider about which screenings and vaccines you need and how often you need them. This information is not intended to replace advice given to you by your health care provider. Make sure you discuss any questions you have with your health care provider. Document Released: 04/30/2015 Document Revised: 12/22/2015 Document Reviewed: 02/02/2015 Elsevier Interactive Patient Education  2017 [place] Prevention in the Home Falls can cause injuries. They can happen to people of all ages. There are many things you can do to make your home safe and to help prevent falls. What can I do on the outside of my home?  Regularly fix the edges of walkways and driveways and fix any cracks.  Remove anything that might make you trip as you walk through a door, such as a raised step or threshold.  Trim any bushes or trees on the path to your home.  Use bright outdoor lighting.  Clear any walking paths of anything that might make someone trip, such as rocks or tools.  Regularly check to see if handrails are loose or broken. Make sure that both sides of any steps have handrails.  Any raised decks and porches should have guardrails on the edges.  Have any leaves, snow, or ice cleared regularly.  Use sand or salt on walking paths during winter.  Clean up any spills in your garage right away. This includes oil or grease spills. What can I do in the bathroom?  Use night lights.  Install grab bars by the toilet and in the tub and shower. Do not use towel bars as grab bars.  Use non-skid mats or decals in the tub or shower.  If you need to sit down in the shower, use a plastic, non-slip stool.  Keep the floor dry. Clean up any water that spills on the floor as soon as it happens.  Remove soap buildup in the tub or shower regularly.  Attach bath mats securely with double-sided non-slip rug tape.  Do not have throw rugs and other  things on the floor that can make you trip. What can I do in the bedroom?  Use night lights.  Make sure that you have a light by your bed that is easy to reach.  Do not use any sheets or blankets that are too big for your bed. They should not hang down onto the floor.  Have a firm chair that has side arms. You can use this for support while you get dressed.  Do not have throw rugs and other things on the floor that can make you trip. What can I do in the kitchen?  Clean up any spills right away.  Avoid walking on wet floors.  Keep items that you use a lot in easy-to-reach places.  If you need to reach something above you, use a strong step stool that has a grab bar.  Keep electrical cords out of the way.  Do not use floor polish or wax that makes floors slippery. If you must use wax, use non-skid floor wax.  Do not have throw rugs and other things on the floor that can make you trip. What can I do with my stairs?  Do not leave any items on the stairs.  Make  sure that there are handrails on both sides of the stairs and use them. Fix handrails that are broken or loose. Make sure that handrails are as long as the stairways.  Check any carpeting to make sure that it is firmly attached to the stairs. Fix any carpet that is loose or worn.  Avoid having throw rugs at the top or bottom of the stairs. If you do have throw rugs, attach them to the floor with carpet tape.  Make sure that you have a light switch at the top of the stairs and the bottom of the stairs. If you do not have them, ask someone to add them for you. What else can I do to help prevent falls?  Wear shoes that:  Do not have high heels.  Have rubber bottoms.  Are comfortable and fit you well.  Are closed at the toe. Do not wear sandals.  If you use a stepladder:  Make sure that it is fully opened. Do not climb a closed stepladder.  Make sure that both sides of the stepladder are locked into place.  Ask  someone to hold it for you, if possible.  Clearly mark and make sure that you can see:  Any grab bars or handrails.  First and last steps.  Where the edge of each step is.  Use tools that help you move around (mobility aids) if they are needed. These include:  Canes.  Walkers.  Scooters.  Crutches.  Turn on the lights when you go into a dark area. Replace any light bulbs as soon as they burn out.  Set up your furniture so you have a clear path. Avoid moving your furniture around.  If any of your floors are uneven, fix them.  If there are any pets around you, be aware of where they are.  Review your medicines with your doctor. Some medicines can make you feel dizzy. This can increase your chance of falling. Ask your doctor what other things that you can do to help prevent falls. This information is not intended to replace advice given to you by your health care provider. Make sure you discuss any questions you have with your health care provider. Document Released: 01/28/2009 Document Revised: 09/09/2015 Document Reviewed: 05/08/2014 Elsevier Interactive Patient Education  2017 [employer].

## 2020-03-11 ENCOUNTER — Other Ambulatory Visit: Payer: Self-pay | Admitting: Family Medicine

## 2020-03-11 DIAGNOSIS — I10 Essential (primary) hypertension: Secondary | ICD-10-CM

## 2020-03-31 DIAGNOSIS — H401131 Primary open-angle glaucoma, bilateral, mild stage: Secondary | ICD-10-CM | POA: Diagnosis not present

## 2020-04-07 DIAGNOSIS — H401131 Primary open-angle glaucoma, bilateral, mild stage: Secondary | ICD-10-CM | POA: Diagnosis not present

## 2020-04-22 ENCOUNTER — Ambulatory Visit: Payer: Medicare PPO | Admitting: Family Medicine

## 2020-04-28 ENCOUNTER — Ambulatory Visit
Admission: RE | Admit: 2020-04-28 | Discharge: 2020-04-28 | Disposition: A | Discharge status: Home / Self Care | Payer: Medicare PPO | Source: Ambulatory Visit | Attending: Oncology | Admitting: Oncology

## 2020-04-28 ENCOUNTER — Other Ambulatory Visit: Payer: Self-pay

## 2020-04-28 DIAGNOSIS — I700 Atherosclerosis of aorta: Secondary | ICD-10-CM | POA: Diagnosis not present

## 2020-04-28 DIAGNOSIS — I2510 Atherosclerotic heart disease of native coronary artery without angina pectoris: Secondary | ICD-10-CM | POA: Diagnosis not present

## 2020-04-28 DIAGNOSIS — J984 Other disorders of lung: Secondary | ICD-10-CM | POA: Diagnosis not present

## 2020-04-28 DIAGNOSIS — J8410 Pulmonary fibrosis, unspecified: Secondary | ICD-10-CM | POA: Diagnosis not present

## 2020-04-28 DIAGNOSIS — R911 Solitary pulmonary nodule: Secondary | ICD-10-CM | POA: Insufficient documentation

## 2020-04-29 ENCOUNTER — Other Ambulatory Visit: Payer: Self-pay

## 2020-04-29 ENCOUNTER — Ambulatory Visit: Payer: Medicare PPO | Admitting: Family Medicine

## 2020-04-29 ENCOUNTER — Encounter: Payer: Self-pay | Admitting: Family Medicine

## 2020-04-29 ENCOUNTER — Inpatient Hospital Stay: Payer: Medicare PPO | Attending: Oncology | Admitting: Oncology

## 2020-04-29 VITALS — BP 130/82 | HR 88 | Temp 98.1°F | Resp 16 | Ht 69.0 in | Wt 194.1 lb

## 2020-04-29 DIAGNOSIS — R911 Solitary pulmonary nodule: Secondary | ICD-10-CM | POA: Diagnosis not present

## 2020-04-29 DIAGNOSIS — M1000 Idiopathic gout, unspecified site: Secondary | ICD-10-CM

## 2020-04-29 DIAGNOSIS — I700 Atherosclerosis of aorta: Secondary | ICD-10-CM | POA: Insufficient documentation

## 2020-04-29 DIAGNOSIS — E785 Hyperlipidemia, unspecified: Secondary | ICD-10-CM

## 2020-04-29 DIAGNOSIS — R5383 Other fatigue: Secondary | ICD-10-CM | POA: Diagnosis not present

## 2020-04-29 DIAGNOSIS — Z86711 Personal history of pulmonary embolism: Secondary | ICD-10-CM | POA: Diagnosis not present

## 2020-04-29 DIAGNOSIS — Z125 Encounter for screening for malignant neoplasm of prostate: Secondary | ICD-10-CM

## 2020-04-29 DIAGNOSIS — I10 Essential (primary) hypertension: Secondary | ICD-10-CM | POA: Diagnosis not present

## 2020-04-29 DIAGNOSIS — G4700 Insomnia, unspecified: Secondary | ICD-10-CM

## 2020-04-29 DIAGNOSIS — I2510 Atherosclerotic heart disease of native coronary artery without angina pectoris: Secondary | ICD-10-CM | POA: Diagnosis not present

## 2020-04-29 NOTE — Progress Notes (Signed)
Pulmonary Nodule Clinic Consult note [facility]  Telephone:([phone] [phone]  Patient Care Team: Delsa Grana, PA-C as PCP - General (Family Medicine) Ralene Bathe, MD (Dermatology) Estill Cotta, MD (Ophthalmology) Germaine Pomfret, [facility] as Pharmacist (Pharmacist)   Name of the patient: Victor Knight  [id number]  January 12, 1951   Date of visit: 04/29/2020   Diagnosis- Lung Nodule  Chief complaint/ Reason for visit- Pulmonary Nodule Clinic Initial Visit  Past Medical History:  Patient was recently evaluated in the emergency room for pleuritic right chest pain and found to have bilateral pulmonary embolus with small lung infarct.  He was started on Eliquis and did not require hospital stay.  Imaging with CT angio chest PE protocol revealed acute bilateral pulmonary emboli and a 5 mm subpleural nodule.  Recommendations was for a 40-month follow-up.  Interval history-Mr. Toomey presents today to discuss recent CT imaging.  Mr. Gamboa is a 70 year old relatively healthy male.  He has past medical history significant for hypertension, hep C, abdominal aortic arthrosclerosis, insomnia, hyperlipidemia, gout and remote history of smoking.  He states he smoked greater than 40 years ago and for only 3 to 4 years.  He smoked less than 1 pack of cigarettes per day.  He is retired.  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  Denies any occupational exposures noted cause cancer.   He has no personal or family history of cancer.   He currently is doing well.  He denies any respiratory concerns. Denies any neurologic complaints. Denies recent fevers or illnesses. Denies any easy bleeding or bruising. Reports good appetite and denies weight loss. Denies chest pain. Denies any nausea, vomiting, constipation, or diarrhea. Denies urinary complaints. Patient offers no further specific complaints today.   ECOG FS:0 -  Asymptomatic  Review of systems- Review of Systems  Constitutional: Negative.  Negative for chills, fever, malaise/fatigue and weight loss.  HENT: Negative for congestion, ear pain and tinnitus.   Eyes: Negative.  Negative for blurred vision and double vision.  Respiratory: Negative.  Negative for cough, sputum production and shortness of breath.   Cardiovascular: Negative.  Negative for chest pain, palpitations and leg swelling.  Gastrointestinal: Negative.  Negative for abdominal pain, constipation, diarrhea, nausea and vomiting.  Genitourinary: Negative for dysuria, frequency and urgency.  Musculoskeletal: Negative for back pain and falls.  Skin: Negative.  Negative for rash.  Neurological: Negative.  Negative for weakness and headaches.  Endo/Heme/Allergies: Negative.  Does not bruise/bleed easily.  Psychiatric/Behavioral: Negative.  Negative for depression. The patient is not nervous/anxious and does not have insomnia.      Allergies  Allergen Reactions  . Keflex [Cephalexin] Rash     Past Medical History:  Diagnosis Date  . Abdominal aortic atherosclerosis ([place]) 10/27/2015  . Aortic ectasia, abdominal ([place]) 10/27/2015  . Gout   . Hyperlipidemia   . Hypertension   . Insomnia      Past Surgical History:  Procedure Laterality Date  . CARPAL TUNNEL RELEASE    . rotator cuff surgery   04/20/2016   right arm; [facility]   . TRIGGER FINGER RELEASE      Social History   Socioeconomic History  . Marital status: Married    Spouse name: [name]  . Number of children: 1  . Years of education: Not on file  . Highest education level: High school graduate  Occupational History  . Occupation: Retired  Tobacco Use  . Smoking status: Former Research scientist (life sciences)  .  Smokeless tobacco: Never Used  Vaping Use  . Vaping Use: Never used  Substance and Sexual Activity  . Alcohol use: Yes    Alcohol/week: 7.0 standard drinks    Types: 7 Glasses of wine per week  . Drug use: No  . Sexual  activity: Yes    Partners: Female  Other Topics Concern  . Not on file  Social History Narrative  . Not on file   Social Determinants of Health   Financial Resource Strain: Low Risk   . Difficulty of Paying Living Expenses: Not hard at all  Food Insecurity: No Food Insecurity  . Worried About Charity fundraiser in the Last Year: Never true  . Ran Out of Food in the Last Year: Never true  Transportation Needs: No Transportation Needs  . Lack of Transportation (Medical): No  . Lack of Transportation (Non-Medical): No  Physical Activity: Inactive  . Days of Exercise per Week: 0 days  . Minutes of Exercise per Session: 0 min  Stress: No Stress Concern Present  . Feeling of Stress : Not at all  Social Connections: Socially Integrated  . Frequency of Communication with Friends and Family: More than three times a week  . Frequency of Social Gatherings with Friends and Family: Three times a week  . Attends Religious Services: More than 4 times per year  . Active Member of Clubs or Organizations: Yes  . Attends Archivist Meetings: More than 4 times per year  . Marital Status: Married  Human resources officer Violence: Not At Risk  . Fear of Current or Ex-Partner: No  . Emotionally Abused: No  . Physically Abused: No  . Sexually Abused: No    Family History  Problem Relation Age of Onset  . Hypertension Father   . Heart disease Father   . Hypertension Brother   . Hypertension Sister   . Supraventricular tachycardia Daughter   . Heart disease Daughter   . Hypertension Brother   . Hypertension Brother   . Hypertension Brother   . Heart disease Brother      Current Outpatient Medications:  .  allopurinol (ZYLOPRIM) 100 MG tablet, TAKE 1 TABLET(100 MG) BY MOUTH DAILY, Disp: 90 tablet, Rfl: 3 .  lisinopril (ZESTRIL) 20 MG tablet, Take 1 tablet (20 mg total) by mouth in the morning., Disp: 90 tablet, Rfl: 1 .  LUMIGAN 0.01 % SOLN, Place 1 drop into the right eye at bedtime.  , Disp: , Rfl: 0 .  Multiple Vitamin (MULTIVITAMIN) tablet, Take 1 tablet by mouth daily., Disp: , Rfl:  .  rosuvastatin (CRESTOR) 40 MG tablet, TAKE 1 TABLET(40 MG) BY MOUTH DAILY, Disp: 90 tablet, Rfl: 3 .  traZODone (DESYREL) 100 MG tablet, TAKE 1/2 TO 1 TABLET BY MOUTH AT BEDTIME AS NEEDED FOR SLEEP, Disp: 90 tablet, Rfl: 3  Physical exam: There were no vitals filed for this visit. Physical Exam Constitutional:      General: Vital signs are normal.     Appearance: Normal appearance.  HENT:     Head: Normocephalic and atraumatic.  Eyes:     Pupils: Pupils are equal, round, and reactive to light.  Cardiovascular:     Rate and Rhythm: Normal rate and regular rhythm.     Heart sounds: Normal heart sounds. No murmur heard.   Pulmonary:     Effort: Pulmonary effort is normal.     Breath sounds: Normal breath sounds. No wheezing.  Abdominal:     General: Bowel sounds  are normal. There is no distension.     Palpations: Abdomen is soft.     Tenderness: There is no abdominal tenderness.  Musculoskeletal:        General: No edema. Normal range of motion.     Cervical back: Normal range of motion.  Skin:    General: Skin is warm and dry.     Findings: No rash.  Neurological:     Mental Status: He is alert and oriented to person, place, and time.  Psychiatric:        Judgment: Judgment normal.      CMP Latest Ref Rng & Units 08/27/2019  Glucose 65 - 99 mg/dL 104(H)  BUN 7 - 25 mg/dL 17  Creatinine 0.70 - 1.25 mg/dL 1.02  Sodium 135 - 146 mmol/L 141  Potassium 3.5 - 5.3 mmol/L 4.5  Chloride 98 - 110 mmol/L 107  CO2 20 - 32 mmol/L 27  Calcium 8.6 - 10.3 mg/dL 9.0  Total Protein 6.1 - 8.1 g/dL 6.4  Total Bilirubin 0.2 - 1.2 mg/dL 0.6  Alkaline Phos 38 - 126 U/L -  AST 10 - 35 U/L 28  ALT 9 - 46 U/L 28   CBC Latest Ref Rng & Units 08/27/2019  WBC 3.8 - 10.8 Thousand/uL 4.8  Hemoglobin 13.2 - 17.1 g/dL 14.7  Hematocrit 38.5 - 50.0 % 43.8  Platelets 140 - 400 Thousand/uL 149     No images are attached to the encounter.  CT Chest Wo Contrast  Result Date: 04/28/2020 CLINICAL DATA:  Lung nodule. EXAM: CT CHEST WITHOUT CONTRAST TECHNIQUE: Multidetector CT imaging of the chest was performed following the standard protocol without IV contrast. COMPARISON:  04/28/2019. FINDINGS: Cardiovascular: Atherosclerotic calcification of the aorta and coronary arteries. Heart size normal. No pericardial effusion. Mediastinum/Nodes: No pathologically enlarged mediastinal or axillary lymph nodes. Hilar regions are difficult to definitively evaluate without IV contrast but appear grossly unremarkable. Esophagus is grossly unremarkable. Lungs/Pleura: Minimal biapical pleuroparenchymal scarring. Tiny focus of nonspecific ground-glass in the right upper lobe (3/48). Calcified granulomas. Stable 5 mm subpleural lymph node along the right major fissure (3/116). No pleural fluid. Airway is unremarkable. Upper Abdomen: Visualized portions of the liver, gallbladder and adrenal glands are unremarkable. Punctate stone in the right kidney. Low-attenuation lesion off the upper pole left kidney measures 5.4 cm and is likely a cyst. Visualized portions of the spleen, pancreas, stomach and bowel are unremarkable with the exception of a small hiatal hernia. No upper abdominal adenopathy. Musculoskeletal: Degenerative changes in the spine. No worrisome lytic or sclerotic lesions. IMPRESSION: 1. No suspicious pulmonary nodules. 2. Punctate right renal stone. 3. Aortic atherosclerosis (ICD10-I70.0). Coronary artery calcification. Electronically Signed   By: Lorin Picket M.D.   On: 04/28/2020 10:45   Assessment and plan- Patient is a 70 y.o. male who presents to pulmonary nodule clinic for follow-up of incidental lung nodules.    CT chest without contrast from 04/28/20 reveals no suspicious pulmonary nodules.  No follow-up needed   Calculating malignancy probability of a pulmonary nodule: Risk factors  include: 1.  Age. 2.  Cancer history. 3.  Diameter of pulmonary nodule and mm 4.  Location 5.  Smoking history 6.  Spiculation present   Based on risk factors, this patient is low risk for the development of lung cancer.  No follow-up needed..   During our visit, we discussed pulmonary nodules are a common incidental finding and are often how lung cancer is discovered.  Lung cancer survival is directly  related to the stage at diagnosis.  We discussed that nodules can vary in presentation from solitary pulmonary nodules to masses, 2 groundglass opacities and multiple nodules.  Pulmonary nodules in the majority of cases are benign but the probability of these becoming malignant cannot be undermined.  Early identification of malignant nodules could lead to early diagnosis and increased survival.   We discussed the probability of pulmonary nodules becoming malignant increase with age, pack years of tobacco use, size/characteristics of the nodule and location; with upper lobe involvement being most worrisome.   We discussed the goal of our clinic is to thoroughly evaluate each nodule, developed a comprehensive, individualized plan of care utilizing the most advanced technology and significantly reduce the time from detection to treatment.  A dedicated pulmonary nodule clinic has proven to indeed expedite the detection and treatment of lung cancer.   Patient education in fact sheet provided along with most recent CT scans.   Plan: - Discussed most recent CT scan -Discussed past medical/family history - Discussed social and environmental factors. -She is considered low risk for the development of lung cancer. -No additional follow-up is needed  Disposition: - Refer back to PCP.  Visit Diagnosis 1. Solitary pulmonary nodule     Patient expressed understanding and was in agreement with this plan. He also understands that He can call clinic at any time with any questions, concerns, or  complaints.   Greater than 50% was spent in counseling and coordination of care with this patient including but not limited to discussion of the relevant topics above (See A&P) including, but not limited to diagnosis and management of acute and chronic medical conditions.   Thank you for allowing me to participate in the care of this very pleasant patient.    Jacquelin Hawking, NP [place] [facility] Cell - [phone] Pager- [phone] 04/29/2020 2:56 PM

## 2020-04-29 NOTE — Progress Notes (Signed)
Name: Victor Knight   MRN: [id number]    DOB: 1950-10-27   Date:04/29/2020       Progress Note  Chief Complaint  Patient presents with  . Hyperlipidemia  . Hypertension     Subjective:   Victor Knight is a 70 y.o. male, presents to clinic for routine f/up  Hypertension:  Currently managed on lisinopril - recently transitioned from atenolol med to ACEI, lisinopril was started at 10 mg and increased to 20 mg  Pt reports good med compliance and denies any SE.   Blood pressure today is well controlled. BP Readings from Last 3 Encounters:  04/29/20 130/82  03/05/20 132/80  02/05/20 138/87   Pt denies CP, SOB, exertional sx, LE edema, palpitation, Ha's, visual disturbances, lightheadedness, hypotension, syncope. Dietary efforts for BP?  Healthy diet and working on being more active Reviewed last chemistry, renal function good, electrolytes normal  Hyperlipidemia: Currently treated with crestor 40 mg, pt reports good med compliance, stopped zetia over the past couple months Last Lipids: Lab Results  Component Value Date   CHOL 138 08/27/2019   HDL 62 08/27/2019   LDLCALC 62 08/27/2019   TRIG 62 08/27/2019   CHOLHDL 2.2 08/27/2019   - Denies: Chest pain, shortness of breath, myalgias, claudication  Insomnia - on trazodone 100 mg at bedtime, working still, no SE or concerns  Gout- on allopurinol 100 mg daily, no recent flares, taking meds daily, no concerns or SE Last labs were normal Lab Results  Component Value Date   LABURIC 4.9 08/27/2019   Fatigue - improving, feels lifestyle, activity level, changes with COVID are a factor He is restarting swimming and thinks that will help  Lung nodule - did f/up screening CT and it was normal, no need for further screening, reviewed CT and oncology OV - no further f/up needed Hematology/oncology f/up with unprovoked PE last year, he did his follow-up this month with Dr. Grayland Ormond and additionally does not need to do any  further follow-up   Current Outpatient Medications:  .  allopurinol (ZYLOPRIM) 100 MG tablet, TAKE 1 TABLET(100 MG) BY MOUTH DAILY, Disp: 90 tablet, Rfl: 3 .  lisinopril (ZESTRIL) 20 MG tablet, Take 1 tablet (20 mg total) by mouth in the morning., Disp: 90 tablet, Rfl: 1 .  LUMIGAN 0.01 % SOLN, Place 1 drop into the right eye at bedtime. , Disp: , Rfl: 0 .  Multiple Vitamin (MULTIVITAMIN) tablet, Take 1 tablet by mouth daily., Disp: , Rfl:  .  rosuvastatin (CRESTOR) 40 MG tablet, TAKE 1 TABLET(40 MG) BY MOUTH DAILY, Disp: 90 tablet, Rfl: 3 .  traZODone (DESYREL) 100 MG tablet, TAKE 1/2 TO 1 TABLET BY MOUTH AT BEDTIME AS NEEDED FOR SLEEP, Disp: 90 tablet, Rfl: 3  Patient Active Problem List   Diagnosis Date Noted  . Bilateral pulmonary embolism ([place]) 04/28/2019  . Plantar fasciitis of left foot 03/31/2019  . Medicare annual wellness visit, subsequent 07/03/2017  . Tendinitis of shoulder 04/09/2017  . Bursitis of shoulder 04/09/2017  . Shoulder pain 04/09/2017  . Adhesive capsulitis of shoulder 04/09/2017  . Dupuytren contracture 04/09/2017  . Complete tear of right rotator cuff 04/20/2016  . Preventative health care 12/05/2015  . Medication monitoring encounter 11/02/2015  . Abdominal aortic atherosclerosis ([place]) 10/27/2015  . Aortic ectasia, abdominal ([place]) 10/27/2015  . Hx of smoking 10/05/2015  . History of hepatitis C 10/05/2015  . Prostate cancer screening 10/05/2015  . Disease of liver 09/29/2015  . Hypertension 10/05/2014  .  Insomnia 10/05/2014  . Hyperlipidemia LDL goal <70 10/05/2014  . Gout 10/05/2014    Past Surgical History:  Procedure Laterality Date  . CARPAL TUNNEL RELEASE    . rotator cuff surgery   04/20/2016   right arm; [facility]   . TRIGGER FINGER RELEASE      Family History  Problem Relation Age of Onset  . Hypertension Father   . Heart disease Father   . Hypertension Brother   . Hypertension Sister   . Supraventricular tachycardia Daughter   . Heart  disease Daughter   . Hypertension Brother   . Hypertension Brother   . Hypertension Brother   . Heart disease Brother     Social History   Tobacco Use  . Smoking status: Former Research scientist (life sciences)  . Smokeless tobacco: Never Used  Vaping Use  . Vaping Use: Never used  Substance Use Topics  . Alcohol use: Yes    Alcohol/week: 7.0 standard drinks    Types: 7 Glasses of wine per week  . Drug use: No     Allergies  Allergen Reactions  . Keflex [Cephalexin] Rash    Health Maintenance  Topic Date Due  . COLONOSCOPY (Pts 45-44yrs Insurance coverage will need to be confirmed)  04/18/2023  . TETANUS/TDAP  09/28/2025  . INFLUENZA VACCINE  Completed  . COVID-19 Vaccine  Completed  . Hepatitis C Screening  Completed  . PNA vac Low Risk Adult  Completed    Chart Review Today: I personally reviewed active problem list, medication list, allergies, family history, social history, health maintenance, notes from last encounter, lab results, imaging with the patient/caregiver today.   Review of Systems  Constitutional: Negative.   HENT: Negative.   Eyes: Negative.   Respiratory: Negative.   Cardiovascular: Negative.   Gastrointestinal: Negative.   Endocrine: Negative.   Genitourinary: Negative.   Musculoskeletal: Negative.   Skin: Negative.   Allergic/Immunologic: Negative.   Neurological: Negative.   Hematological: Negative.   Psychiatric/Behavioral: Negative.   All other systems reviewed and are negative.   Objective:   Vitals:   04/29/20 1118  BP: 130/82  Pulse: 88  Resp: 16  Temp: 98.1 F (36.7 C)  TempSrc: Oral  SpO2: 98%  Weight: 194 lb 1.6 oz (88 kg)  Height: 5\' 9"  (1.753 m)    Body mass index is 28.66 kg/m.  Physical Exam Vitals and nursing note reviewed.  Constitutional:      General: He is not in acute distress.    Appearance: Normal appearance. He is well-developed, well-groomed and overweight. He is not ill-appearing, toxic-appearing or diaphoretic.      Interventions: Face mask in place.  HENT:     Head: Normocephalic and atraumatic.     Jaw: No trismus.     Right Ear: External ear normal.     Left Ear: External ear normal.  Eyes:     General: Lids are normal. No scleral icterus.       Right eye: No discharge.        Left eye: No discharge.     Conjunctiva/sclera: Conjunctivae normal.  Neck:     Trachea: Trachea and phonation normal. No tracheal deviation.  Cardiovascular:     Rate and Rhythm: Normal rate and regular rhythm.     Pulses: Normal pulses.          Radial pulses are 2+ on the right side and 2+ on the left side.       Posterior tibial pulses are 2+ on the  right side and 2+ on the left side.     Heart sounds: Normal heart sounds. No murmur heard. No friction rub. No gallop.   Pulmonary:     Effort: Pulmonary effort is normal. No respiratory distress.     Breath sounds: Normal breath sounds. No stridor. No wheezing, rhonchi or rales.  Abdominal:     General: Bowel sounds are normal. There is no distension.     Palpations: Abdomen is soft.  Musculoskeletal:     Right lower leg: No edema.     Left lower leg: No edema.  Skin:    General: Skin is warm and dry.     Coloration: Skin is not jaundiced.     Findings: No rash.     Nails: There is no clubbing.  Neurological:     Mental Status: He is alert. Mental status is at baseline.     Cranial Nerves: No dysarthria or facial asymmetry.     Motor: No tremor or abnormal muscle tone.     Gait: Gait normal.  Psychiatric:        Mood and Affect: Mood normal.        Speech: Speech normal.        Behavior: Behavior normal. Behavior is cooperative.         Assessment & Plan:     ICD-10-CM   1. Hyperlipidemia LDL goal <70  Q75.9 COMPLETE METABOLIC PANEL WITH GFR    Lipid panel   Compliant with statin, discontinued Zetia, recheck lipid panel to see if LDL is still at goal, good med compliance no side effects or concerns  2. Insomnia, unspecified type  G47.00    still  using trazodone - still effective, no SE or concerns  3. Essential hypertension  F63 COMPLETE METABOLIC PANEL WITH GFR   Stable, well controlled, blood pressure at goal today, doing well with increased dose of lisinopril, continue 20 mg and other diet lifestyle efforts  4. Idiopathic gout, unspecified chronicity, unspecified site  W46.65 COMPLETE METABOLIC PANEL WITH GFR   No gouty flares, compliant with allopurinol 100 mg daily, last uric acid was normal, continue monitoring  5. Other fatigue  R53.83    doing a little better, getting back to exercising and he thinks that will help  6. Screening for malignant neoplasm of prostate  Z12.5 PSA   no urinary sx, no family hx, asks for PSA for screening   7. History of pulmonary embolus (PE)  Z86.711    He did follow-up with hematology oncology and per Dr. Grayland Ormond does not need to do any further follow-up  8. Pulmonary nodule  R91.1    Resolved no longer needs to do any annual monitoring  9. Coronary artery disease involving native heart without angina pectoris, unspecified vessel or lesion type  I25.10    Noted on screening CT, no current anginal symptoms, has not seen cardiology  10. Aortic atherosclerosis ([facility])  I70.0    Noted on screening CT patient is on statin and seeing vascular also abdominal aortic atherosclerosis history     Return in about 6 months (around 10/27/2020) for Routine follow-up.   Delsa Grana, PA-C 04/29/20 11:38 AM

## 2020-04-30 ENCOUNTER — Other Ambulatory Visit: Payer: Self-pay | Admitting: Family Medicine

## 2020-04-30 DIAGNOSIS — E785 Hyperlipidemia, unspecified: Secondary | ICD-10-CM

## 2020-04-30 LAB — COMPLETE METABOLIC PANEL WITH GFR
AG Ratio: 1.8 (calc) (ref 1.0–2.5)
ALT: 25 U/L (ref 9–46)
AST: 24 U/L (ref 10–35)
Albumin: 4.5 g/dL (ref 3.6–5.1)
Alkaline phosphatase (APISO): 39 U/L (ref 35–144)
BUN: 24 mg/dL (ref 7–25)
CO2: 27 mmol/L (ref 20–32)
Calcium: 9.7 mg/dL (ref 8.6–10.3)
Chloride: 106 mmol/L (ref 98–110)
Creat: 1.01 mg/dL (ref 0.70–1.25)
GFR, Est African American: 88 mL/min/{1.73_m2} (ref >=60)
GFR, Est Non African American: 76 mL/min/{1.73_m2} (ref >=60)
Globulin: 2.5 g/dL (calc) (ref 1.9–3.7)
Glucose, Bld: 96 mg/dL (ref 65–99)
Potassium: 4.8 mmol/L (ref 3.5–5.3)
Sodium: 141 mmol/L (ref 135–146)
Total Bilirubin: 0.6 mg/dL (ref 0.2–1.2)
Total Protein: 7 g/dL (ref 6.1–8.1)

## 2020-04-30 LAB — LIPID PANEL
Cholesterol: 198 mg/dL (ref <200)
HDL: 71 mg/dL (ref >=40)
LDL Cholesterol (Calc): 111 mg/dL (calc) — ABNORMAL HIGH
Non-HDL Cholesterol (Calc): 127 mg/dL (calc) (ref <130)
Total CHOL/HDL Ratio: 2.8 (calc) (ref <5.0)
Triglycerides: 75 mg/dL (ref <150)

## 2020-04-30 LAB — PSA: PSA: 2.14 ng/mL (ref <=4.0)

## 2020-04-30 MED ORDER — EZETIMIBE 10 MG PO TABS: 10.0000 mg | ORAL_TABLET | Freq: Every day | ORAL | 3 refills | Status: DC

## 2020-05-04 ENCOUNTER — Other Ambulatory Visit: Payer: Self-pay

## 2020-05-04 DIAGNOSIS — F5101 Primary insomnia: Secondary | ICD-10-CM

## 2020-05-04 MED ORDER — TRAZODONE HCL 100 MG PO TABS: ORAL_TABLET | ORAL | 3 refills | Status: DC

## 2020-07-27 IMAGING — DX DG CHEST 1V PORT
1 series · 2 of 2 positions shown · non-contrast
Comparison: 04/28/2019 at [DATE] a.m.

CLINICAL DATA: Chest pain

EXAM:
PORTABLE CHEST 1 VIEW

[Series 1: chest ap · 0.14mm/px · 2 of 2 slices shown]
[im 1/2]
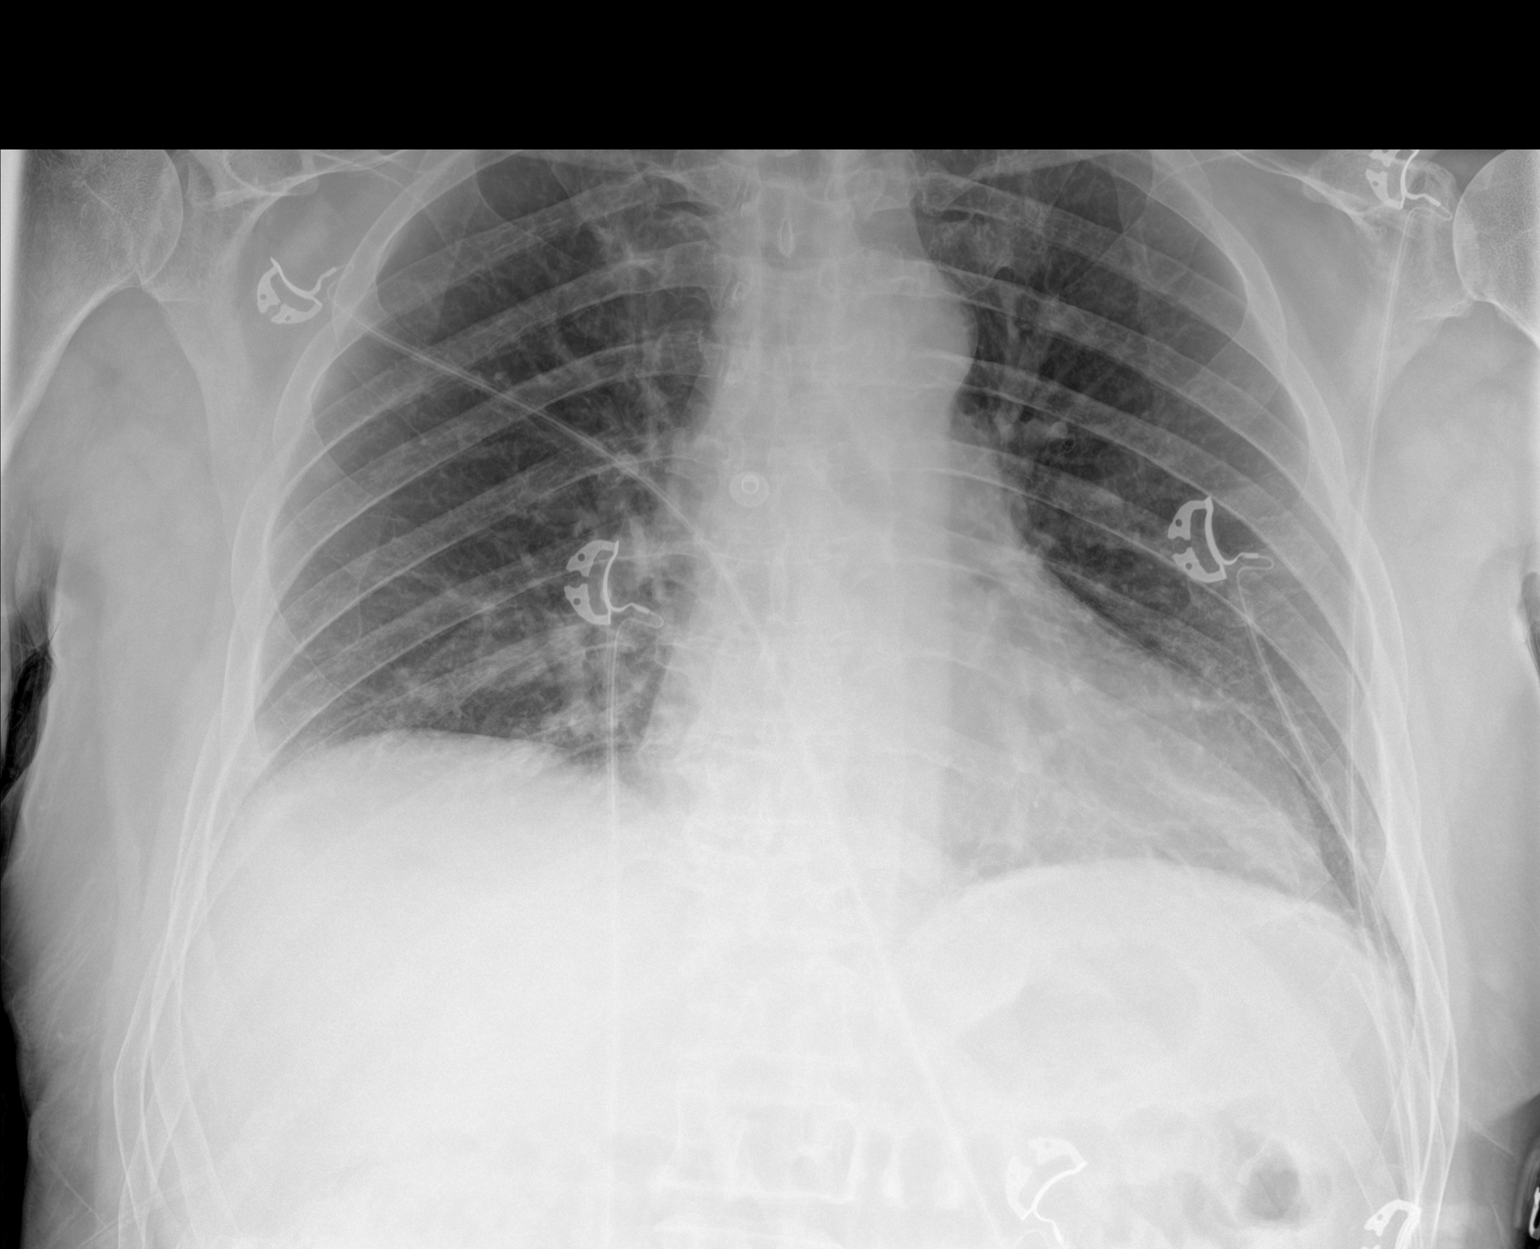
[im 2/2]
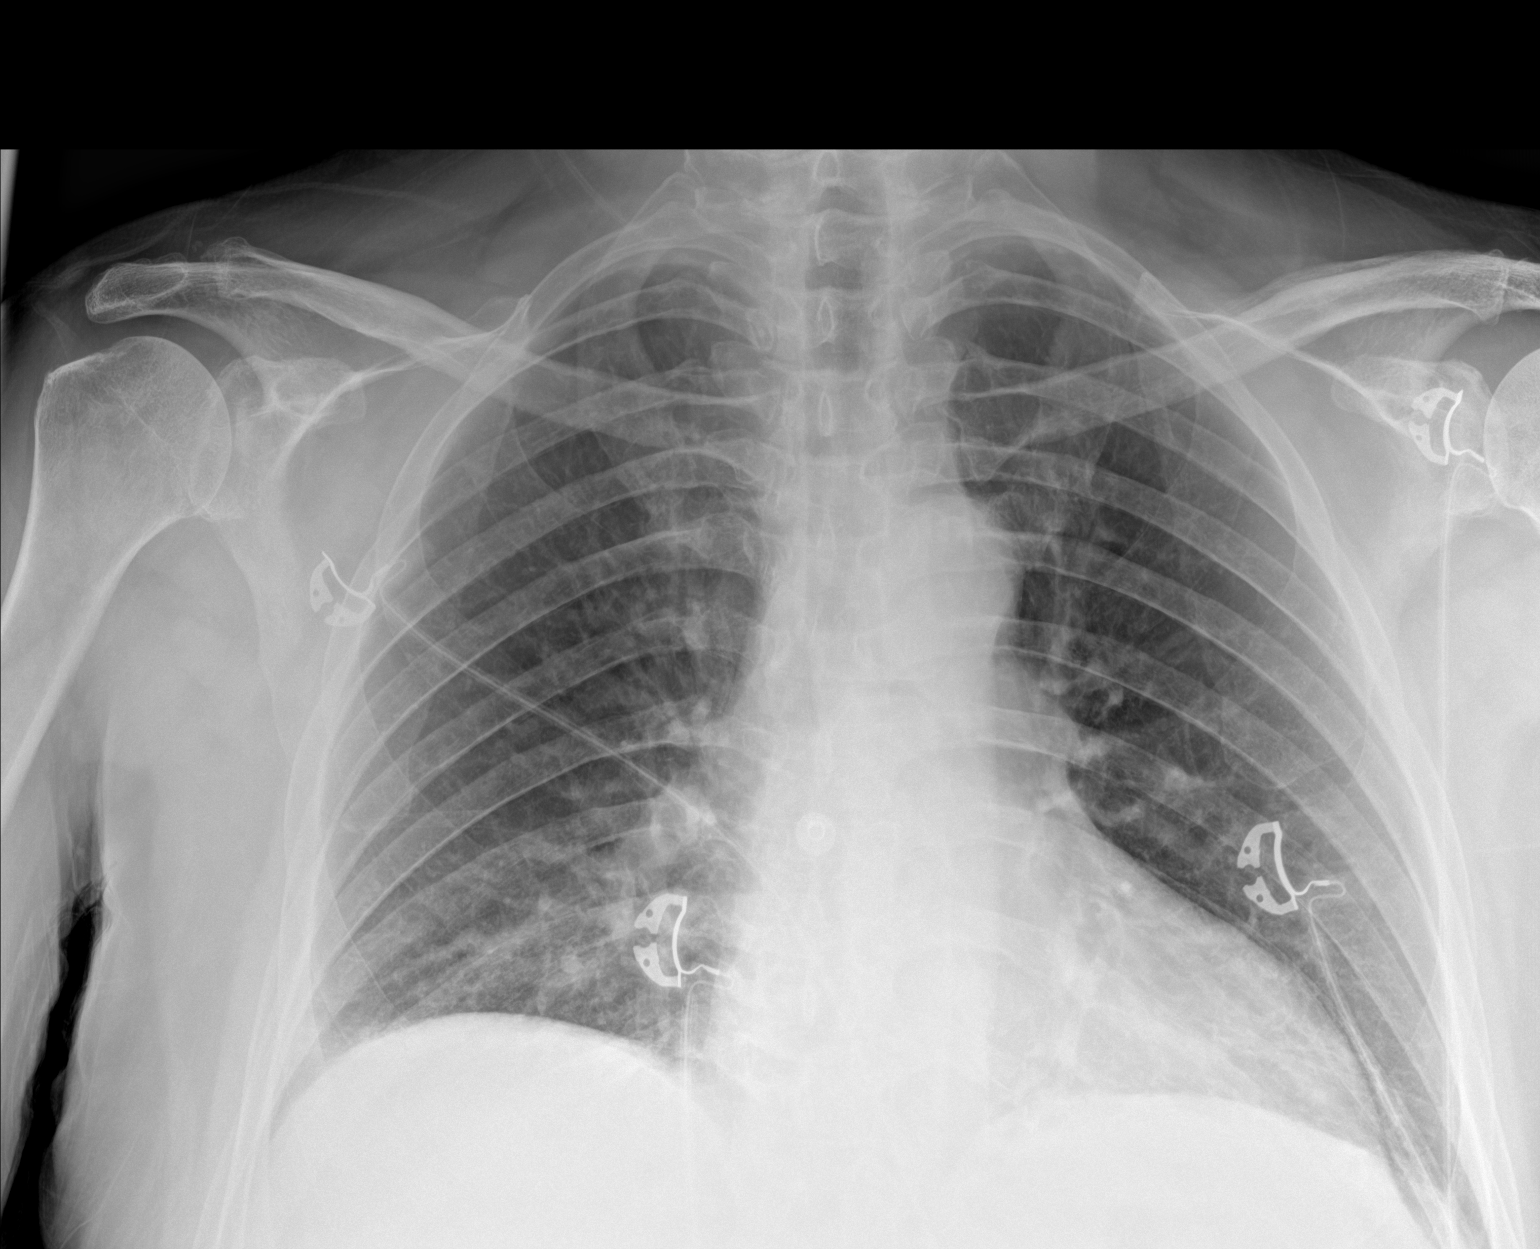

[2 of 2 positions shown; findings below may reference images not displayed]

FINDINGS: The heart size and mediastinal contours are within normal limits.
Both lungs are clear. The visualized skeletal structures are
unremarkable.
IMPRESSION: No active disease.

## 2020-07-27 IMAGING — CT CT ANGIO CHEST
2 of 6 series · 18 of 46 positions shown · IV contrast (APPLIED)
Comparison: None.

CLINICAL DATA: Right-sided chest pain

EXAM:
CT ANGIOGRAPHY CHEST WITH CONTRAST
TECHNIQUE: Multidetector CT imaging of the chest was performed using the
standard protocol during bolus administration of intravenous
contrast. Multiplanar CT image reconstructions and MIPs were
obtained to evaluate the vascular anatomy.
CONTRAST:  75mL OMNIPAQUE IOHEXOL 350 MG/ML SOLN

[Series 5: thins · axial · 0.74mm/px · z∈[-724,-480]mm · 15 of 268 slices shown]
[im 12/268  lung]
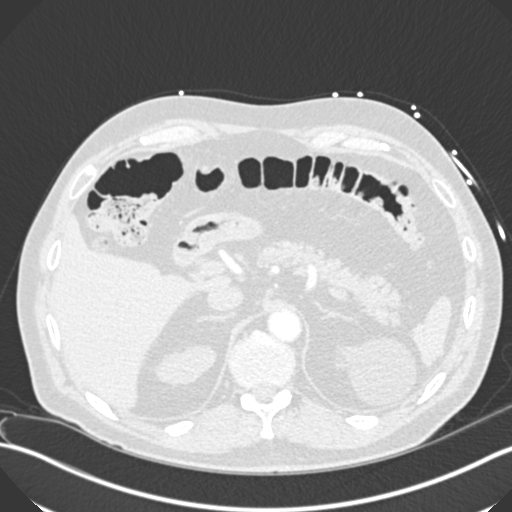
[im 35/268  soft-tissue]
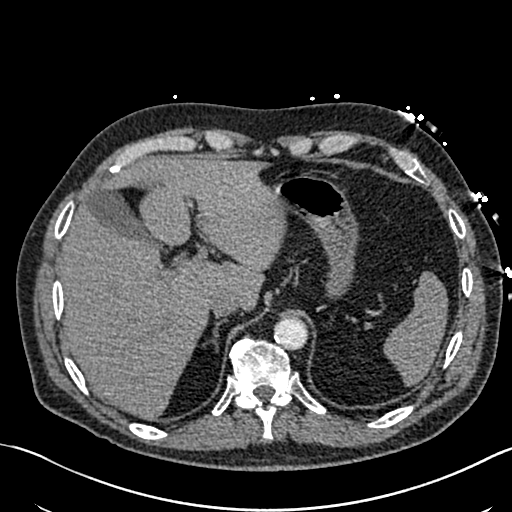
[im 47/268  lung]
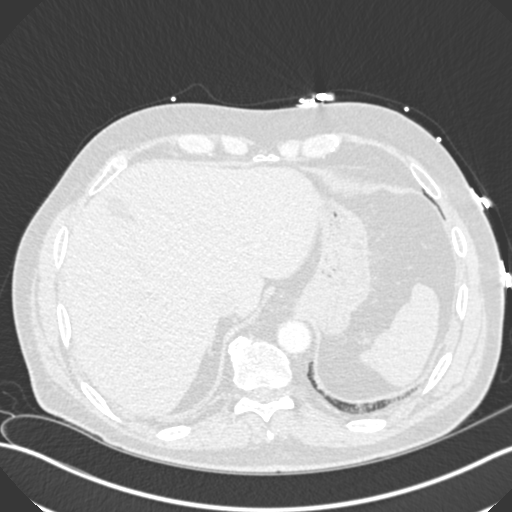
[im 70/268  soft-tissue]
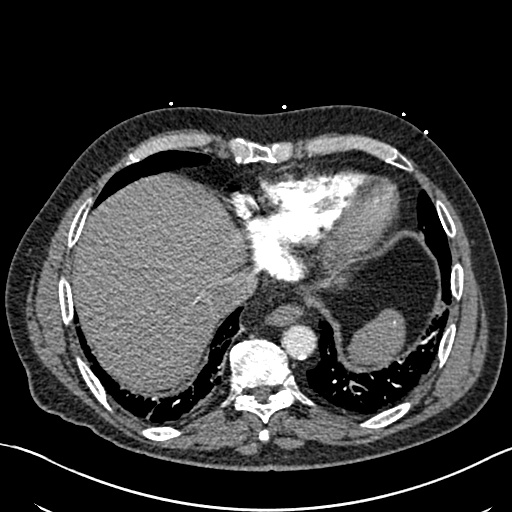
[im 82/268  lung]
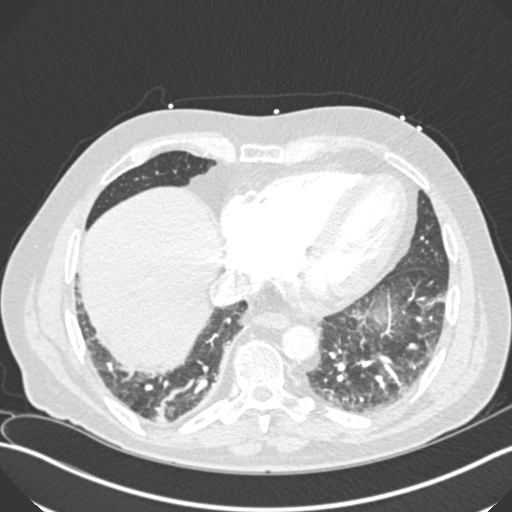
[im 105/268  soft-tissue]
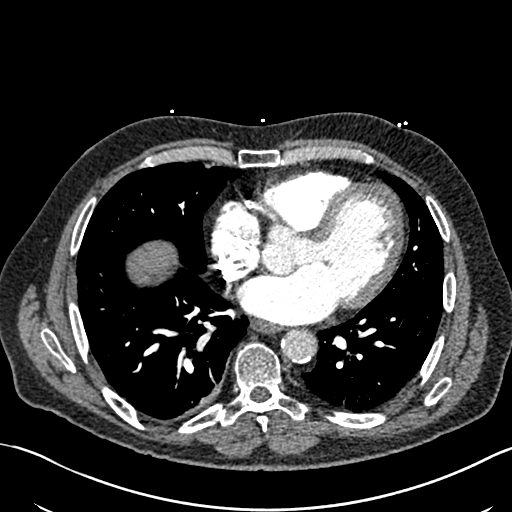
[im 117/268  lung]
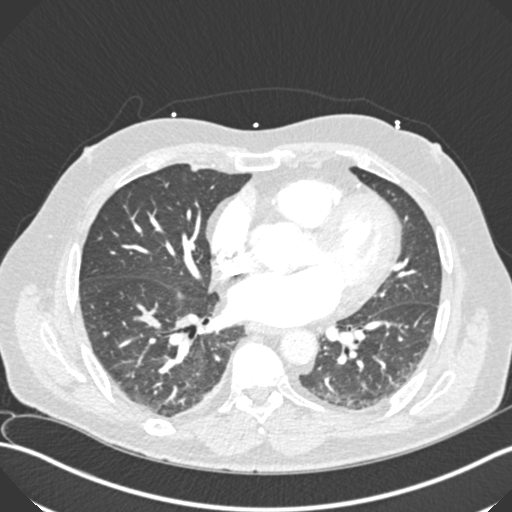
[im 140/268  soft-tissue]
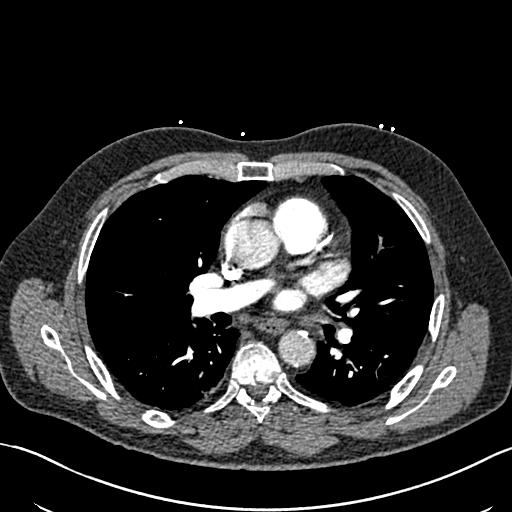
[im 151/268  lung]
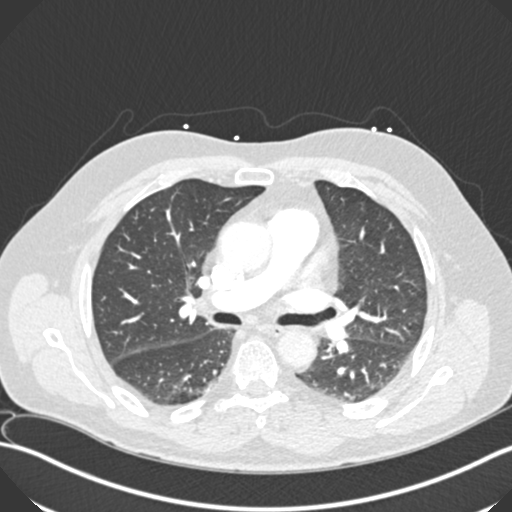
[im 163/268  soft-tissue]
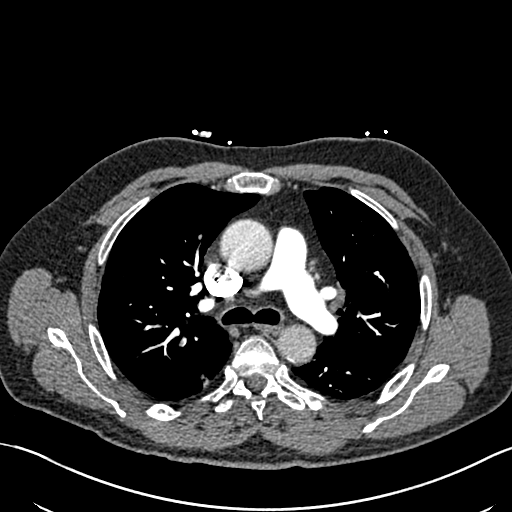
[im 186/268  lung]
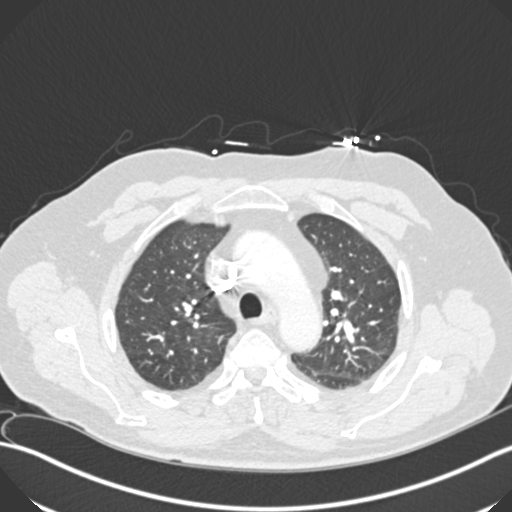
[im 198/268  soft-tissue]
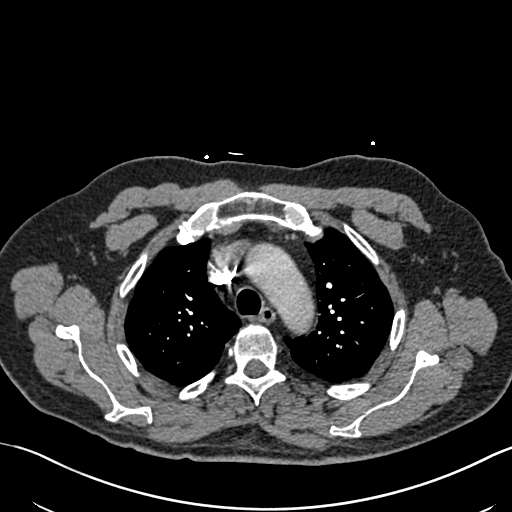
[im 221/268  lung]
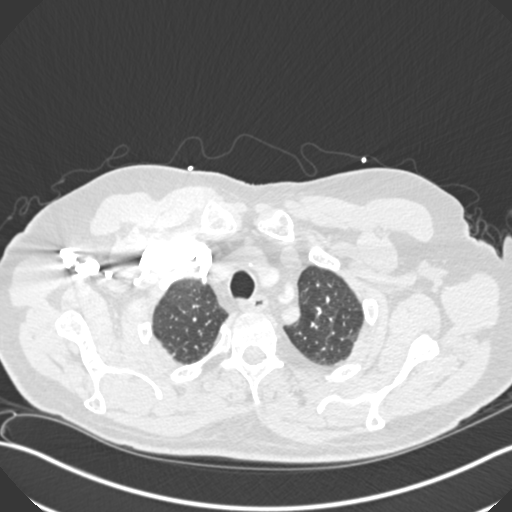
[im 233/268  soft-tissue]
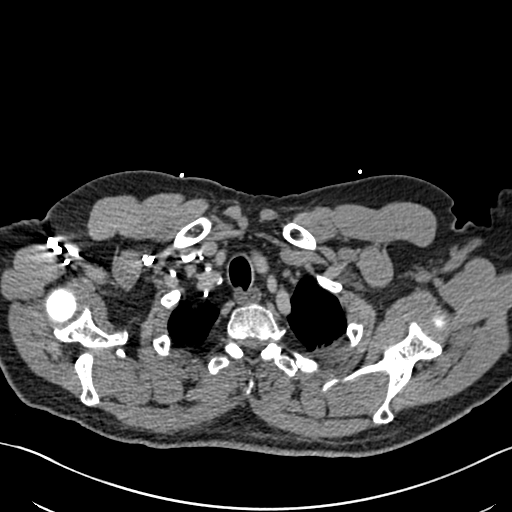
[im 256/268  lung]
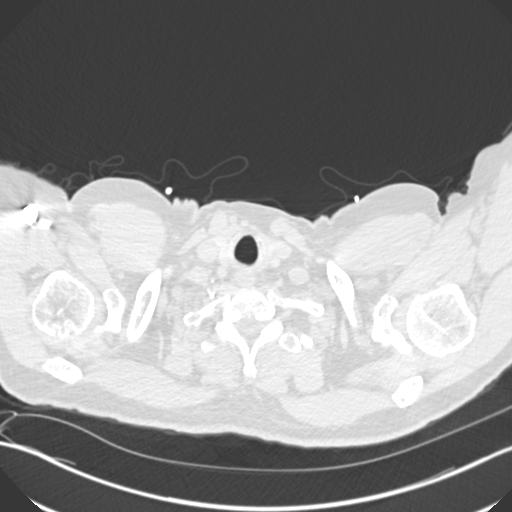

[Series 7: coronal mpr · coronal · 0.55mm/px · 3 of 82 slices shown]
[im 21/82  soft-tissue]
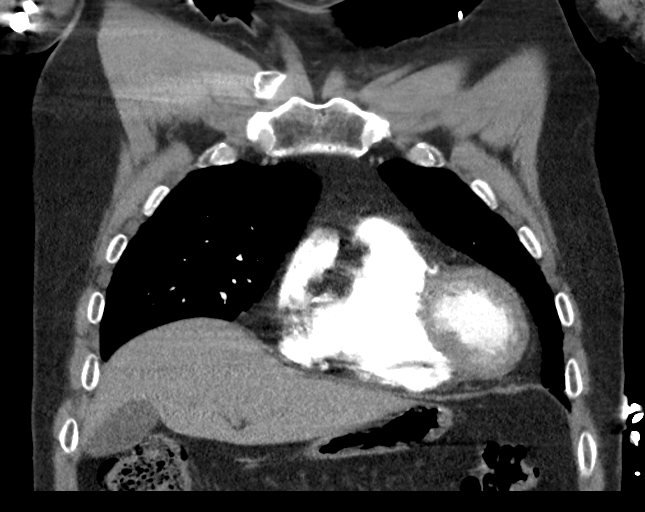
[im 41/82  soft-tissue]
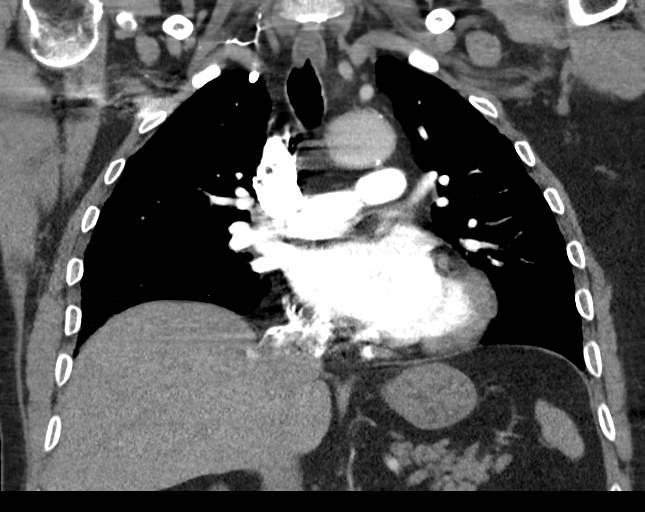
[im 61/82  soft-tissue]
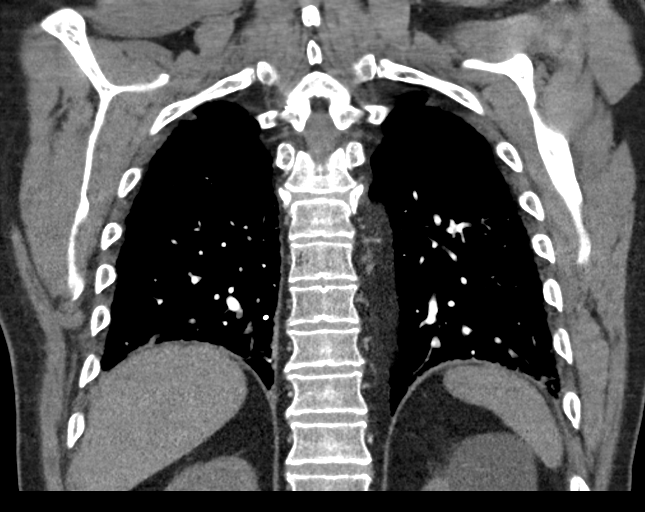

[18 of 46 positions shown; findings below may reference images not displayed]

FINDINGS: Cardiovascular: Bilateral central and branching pulmonary artery
filling defects, subsegmental on the left and up to segmental on the
right. Most proximal clot is seen in the anterior basal segment of
the right lower lobe where there is occlusive clot and lung infarct
appearance. The emboli are marked on series 5. There is insufficient
clot burden to implicate RV strain. Coronary atherosclerosis in both
the left and right circulation. Aortic atherosclerosis.

Mediastinum/Nodes: Negative for adenopathy or mass.

Lungs/Pleura: Wedge of airspace disease in the subpleural right
lower lobe with mild dependent atelectasis on both sides. Subpleural
nodule along the lower right major fissure measuring 5 mm average
diameter. No pulmonary edema.

Upper Abdomen: Partially covered left renal cystic density.

Musculoskeletal: Spondylosis

Critical Value/emergent results were called by telephone at the time
of interpretation on 04/28/2019 at [DATE] to Haris Jasmina Anzenberger , who
verbally acknowledged these results.

Review of the MIP images confirms the above findings.
IMPRESSION: 1. Acute bilateral pulmonary emboli that are segmental to
subsegmental in size. A small lung infarct is seen in the right
lower lobe.
2. 5 mm subpleural nodule along the lower right major fissure,
likely lymph node. No follow-up needed if patient is low-risk.
Non-contrast chest CT can be considered in 12 months if patient is
high-risk. This recommendation follows the consensus statement:
Guidelines for Management of Incidental Pulmonary Nodules Detected
3.  Aortic Atherosclerosis (GG86Y-NSS.S).  Coronary atherosclerosis.

## 2020-08-04 ENCOUNTER — Encounter: Payer: Self-pay | Admitting: Family Medicine

## 2020-08-04 DIAGNOSIS — I10 Essential (primary) hypertension: Secondary | ICD-10-CM

## 2020-08-04 MED ORDER — LISINOPRIL 20 MG PO TABS: 20.0000 mg | ORAL_TABLET | Freq: Every morning | ORAL | 0 refills | Status: DC

## 2020-08-07 ENCOUNTER — Other Ambulatory Visit: Payer: Self-pay | Admitting: Family Medicine

## 2020-08-07 DIAGNOSIS — I10 Essential (primary) hypertension: Secondary | ICD-10-CM

## 2020-10-04 DIAGNOSIS — H401131 Primary open-angle glaucoma, bilateral, mild stage: Secondary | ICD-10-CM | POA: Diagnosis not present

## 2020-10-27 ENCOUNTER — Other Ambulatory Visit: Payer: Self-pay

## 2020-10-27 ENCOUNTER — Encounter: Payer: Self-pay | Admitting: Family Medicine

## 2020-10-27 ENCOUNTER — Ambulatory Visit: Payer: Medicare PPO | Admitting: Family Medicine

## 2020-10-27 VITALS — BP 124/68 | HR 85 | Temp 98.3°F | Resp 16 | Ht 69.0 in | Wt 191.6 lb

## 2020-10-27 DIAGNOSIS — I10 Essential (primary) hypertension: Secondary | ICD-10-CM

## 2020-10-27 DIAGNOSIS — F5101 Primary insomnia: Secondary | ICD-10-CM | POA: Diagnosis not present

## 2020-10-27 DIAGNOSIS — E785 Hyperlipidemia, unspecified: Secondary | ICD-10-CM | POA: Diagnosis not present

## 2020-10-27 DIAGNOSIS — Z5181 Encounter for therapeutic drug level monitoring: Secondary | ICD-10-CM | POA: Diagnosis not present

## 2020-10-27 DIAGNOSIS — I2510 Atherosclerotic heart disease of native coronary artery without angina pectoris: Secondary | ICD-10-CM

## 2020-10-27 DIAGNOSIS — G4700 Insomnia, unspecified: Secondary | ICD-10-CM

## 2020-10-27 DIAGNOSIS — M1000 Idiopathic gout, unspecified site: Secondary | ICD-10-CM | POA: Diagnosis not present

## 2020-10-27 DIAGNOSIS — Z23 Encounter for immunization: Secondary | ICD-10-CM

## 2020-10-27 DIAGNOSIS — I77819 Aortic ectasia, unspecified site: Secondary | ICD-10-CM | POA: Diagnosis not present

## 2020-10-27 MED ORDER — TRAZODONE HCL 100 MG PO TABS: ORAL_TABLET | ORAL | 3 refills | Status: DC

## 2020-10-27 MED ORDER — ZOSTER VAC RECOMB ADJUVANTED 50 MCG/0.5ML IM SUSR: 0.5000 mL | Freq: Once | INTRAMUSCULAR | 1 refills | Status: AC

## 2020-10-27 MED ORDER — LISINOPRIL 20 MG PO TABS: 20.0000 mg | ORAL_TABLET | Freq: Every morning | ORAL | 3 refills | Status: DC

## 2020-10-27 NOTE — Progress Notes (Signed)
Name: Victor Knight   MRN: [id number]    DOB: Jan 27, 1951   Date:10/27/2020       Progress Note  Chief Complaint  Patient presents with   Hypertension   Insomnia   Hyperlipidemia     Subjective:   Victor Knight is a 70 y.o. male, presents to clinic for routine f/up  HLD on crestor 40, stopped zetia previously Lab Results  Component Value Date   CHOL 198 04/29/2020   HDL 71 04/29/2020   LDLCALC 111 (H) 04/29/2020   TRIG 75 04/29/2020   CHOLHDL 2.8 04/29/2020  Here to recheck lipid panel, last showed LDL >100, previously was 60-70's  Insomnia well controlled on trazodone - he needs refills, some problem with pharmacy - 1 year supply was put in in Jan  Hypertension:  Currently managed on lisinopril 20 mg daily changed in the last year from atenolol for decades Pt reports good med compliance and denies any SE.   Blood pressure today is well controlled. BP Readings from Last 3 Encounters:  10/27/20 124/68  04/29/20 130/82  03/05/20 132/80   Pt denies CP, SOB, exertional sx, LE edema, palpitation, Ha's, visual disturbances, lightheadedness, hypotension, syncope. Dietary efforts for BP?  Getting back to being more active again  Gout On allopurinol 100 mg daily, no recent flare Lab Results  Component Value Date   LABURIC 4.9 08/27/2019     Due for AAA [place] f/up -patient did his Medicare AAA screening October 26, 2015 and found ectatic abdominal aorta at risk for aneurysm development with recommended follow-up by ultrasound in 5 years this would be due this month -will order Patient with chest pain abdominal pain episode last year and subsequent diagnosis of PE did have imaging 04/28/2019 of aorta that did not show any significant change from 2017 screening test     Current Outpatient Medications:    allopurinol (ZYLOPRIM) 100 MG tablet, TAKE 1 TABLET(100 MG) BY MOUTH DAILY, Disp: 90 tablet, Rfl: 3   aspirin EC 81 MG tablet, Take 81 mg by mouth daily. Swallow whole.,  Disp: , Rfl:    latanoprost (XALATAN) 0.005 % ophthalmic solution, Place 1 drop into the right eye daily., Disp: , Rfl:    lisinopril (ZESTRIL) 20 MG tablet, Take 1 tablet (20 mg total) by mouth in the morning., Disp: 90 tablet, Rfl: 0   LUMIGAN 0.01 % SOLN, Place 1 drop into the right eye at bedtime. , Disp: , Rfl: 0   Multiple Vitamin (MULTIVITAMIN) tablet, Take 1 tablet by mouth daily., Disp: , Rfl:    rosuvastatin (CRESTOR) 40 MG tablet, TAKE 1 TABLET(40 MG) BY MOUTH DAILY, Disp: 90 tablet, Rfl: 3   traZODone (DESYREL) 100 MG tablet, TAKE 1/2 TO 1 TABLET BY MOUTH AT BEDTIME AS NEEDED FOR SLEEP, Disp: 90 tablet, Rfl: 3  Patient Active Problem List   Diagnosis Date Noted   Aortic atherosclerosis ([place]) 04/29/2020   History of pulmonary embolus (PE) 04/29/2020   Coronary artery disease involving native heart without angina pectoris 04/29/2020   Bilateral pulmonary embolism ([place]) 04/28/2019   Plantar fasciitis of left foot 03/31/2019   Medicare annual wellness visit, subsequent 07/03/2017   Tendinitis of shoulder 04/09/2017   Bursitis of shoulder 04/09/2017   Shoulder pain 04/09/2017   Adhesive capsulitis of shoulder 04/09/2017   Dupuytren contracture 04/09/2017   Complete tear of right rotator cuff 04/20/2016   Preventative health care 12/05/2015   Medication monitoring encounter 11/02/2015   Abdominal aortic atherosclerosis ([place]) 10/27/2015  Aortic ectasia, abdominal ([place]) 10/27/2015   Hx of smoking 10/05/2015   History of hepatitis C 10/05/2015   Prostate cancer screening 10/05/2015   Disease of liver 09/29/2015   Hypertension 10/05/2014   Insomnia 10/05/2014   Hyperlipidemia LDL goal <70 10/05/2014   Gout 10/05/2014    Past Surgical History:  Procedure Laterality Date   CARPAL TUNNEL RELEASE     rotator cuff surgery   04/20/2016   right arm; [facility]    TRIGGER FINGER RELEASE      Family History  Problem Relation Age of Onset   Hypertension Father    Heart disease  Father    Hypertension Brother    Hypertension Sister    Supraventricular tachycardia Daughter    Heart disease Daughter    Hypertension Brother    Hypertension Brother    Hypertension Brother    Heart disease Brother     Social History   Tobacco Use   Smoking status: Former    Pack years: 0.00   Smokeless tobacco: Never  Vaping Use   Vaping Use: Never used  Substance Use Topics   Alcohol use: Yes    Alcohol/week: 7.0 standard drinks    Types: 7 Glasses of wine per week   Drug use: No     Allergies  Allergen Reactions   Keflex [Cephalexin] Rash    Health Maintenance  Topic Date Due   Zoster Vaccines- Shingrix (1 of 2) Never done   COVID-19 Vaccine (4 - Booster for Moderna series) 06/14/2020   INFLUENZA VACCINE  11/15/2020   COLONOSCOPY (Pts 45-44yrs Insurance coverage will need to be confirmed)  04/18/2023   TETANUS/TDAP  09/28/2025   Hepatitis C Screening  Completed   PNA vac Low Risk Adult  Completed   HPV VACCINES  Aged Out    Chart Review Today: I personally reviewed active problem list, medication list, allergies, family history, social history, health maintenance, notes from last encounter, lab results, imaging with the patient/caregiver today.   Review of Systems  Constitutional: Negative.   HENT: Negative.    Eyes: Negative.   Respiratory: Negative.    Cardiovascular: Negative.   Gastrointestinal: Negative.   Endocrine: Negative.   Genitourinary: Negative.   Musculoskeletal: Negative.   Skin: Negative.   Allergic/Immunologic: Negative.   Neurological: Negative.   Hematological: Negative.   Psychiatric/Behavioral: Negative.    All other systems reviewed and are negative.   Objective:   Vitals:   10/27/20 1447  BP: 124/68  Pulse: 85  Resp: 16  Temp: 98.3 F (36.8 C)  SpO2: 97%  Weight: 191 lb 9.6 oz (86.9 kg)  Height: 5\' 9"  (1.753 m)    Body mass index is 28.29 kg/m.  Physical Exam Vitals and nursing note reviewed.   Constitutional:      General: He is not in acute distress.    Appearance: Normal appearance. He is well-developed and normal weight. He is not ill-appearing, toxic-appearing or diaphoretic.     Interventions: Face mask in place.  HENT:     Head: Normocephalic and atraumatic.     Jaw: No trismus.     Right Ear: External ear normal.     Left Ear: External ear normal.  Eyes:     General: Lids are normal. No scleral icterus.       Right eye: No discharge.        Left eye: No discharge.     Conjunctiva/sclera: Conjunctivae normal.  Neck:     Trachea: Trachea and  phonation normal. No tracheal deviation.  Cardiovascular:     Rate and Rhythm: Normal rate and regular rhythm.     Pulses: Normal pulses.          Radial pulses are 2+ on the right side and 2+ on the left side.       Posterior tibial pulses are 2+ on the right side and 2+ on the left side.     Heart sounds: Normal heart sounds. No murmur heard.   No friction rub. No gallop.  Pulmonary:     Effort: Pulmonary effort is normal. No respiratory distress.     Breath sounds: Normal breath sounds. No stridor. No wheezing, rhonchi or rales.  Abdominal:     General: Bowel sounds are normal. There is no distension.     Palpations: Abdomen is soft.  Musculoskeletal:        General: No tenderness.     Right lower leg: No edema.     Left lower leg: No edema.  Skin:    General: Skin is warm and dry.     Coloration: Skin is not jaundiced or pale.     Findings: No rash.     Nails: There is no clubbing.  Neurological:     Mental Status: He is alert. Mental status is at baseline.     Cranial Nerves: No dysarthria or facial asymmetry.     Motor: No tremor or abnormal muscle tone.     Gait: Gait normal.  Psychiatric:        Mood and Affect: Mood normal.        Speech: Speech normal.        Behavior: Behavior normal. Behavior is cooperative.        Assessment & Plan:     ICD-10-CM   1. Essential hypertension  I10 lisinopril  (ZESTRIL) 20 MG tablet    COMPLETE METABOLIC PANEL WITH GFR   Stable, well-controlled, blood pressure at goal today, continue lisinopril 20 mg    2. Hyperlipidemia LDL goal <70  Y07.3 COMPLETE METABOLIC PANEL WITH GFR    Lipid panel   Patient was doing well previously with Crestor 40 and Zetia, he decided to discontinue Zetia and LDL increased to 111 recheck today    3. Idiopathic gout, unspecified chronicity, unspecified site  X10.62 COMPLETE METABOLIC PANEL WITH GFR    Uric acid   Stable, last uric acid was at goal but this was over a year ago, doing well on allopurinol 100 recheck labs    4. Insomnia, unspecified type  G47.00 traZODone (DESYREL) 100 MG tablet   Fairly well-controlled, using trazodone, refills sent in, some miscommunication with pharmacy we will follow-up if this happens again    5. Atherosclerosis of coronary artery of native heart without angina pectoris, unspecified vessel or lesion type  I25.10    On statin    6. Primary insomnia  F51.01    Stable, well controlled with trazodone    7. Need for shingles vaccine  Z23 Zoster Vaccine Adjuvanted [facility]) injection   Discussed with patient, sent to pharmacy he will check with his insurance    8. Ectatic aorta (HCC)  I77.819 US AORTA DUPLEX LIMITED   dx with screening AAA [place] in 10/2015 due for 5 year f/up     9. Medication monitoring encounter  I94.85 COMPLETE METABOLIC PANEL WITH GFR    Lipid panel    Uric acid       Return for MWV likely due, f/up in  office routine f/up in 6-12.   Delsa Grana, PA-C 10/27/20 3:03 PM

## 2020-10-28 ENCOUNTER — Other Ambulatory Visit: Payer: Self-pay | Admitting: Family Medicine

## 2020-10-28 DIAGNOSIS — E785 Hyperlipidemia, unspecified: Secondary | ICD-10-CM

## 2020-10-28 LAB — COMPLETE METABOLIC PANEL WITH GFR
AG Ratio: 1.8 (calc) (ref 1.0–2.5)
ALT: 23 U/L (ref 9–46)
AST: 21 U/L (ref 10–35)
Albumin: 4.2 g/dL (ref 3.6–5.1)
Alkaline phosphatase (APISO): 42 U/L (ref 35–144)
BUN/Creatinine Ratio: 22 (calc) (ref 6–22)
BUN: 26 mg/dL — ABNORMAL HIGH (ref 7–25)
CO2: 24 mmol/L (ref 20–32)
Calcium: 9.3 mg/dL (ref 8.6–10.3)
Chloride: 109 mmol/L (ref 98–110)
Creat: 1.2 mg/dL (ref 0.70–1.28)
Globulin: 2.4 g/dL (calc) (ref 1.9–3.7)
Glucose, Bld: 86 mg/dL (ref 65–99)
Potassium: 4.5 mmol/L (ref 3.5–5.3)
Sodium: 141 mmol/L (ref 135–146)
Total Bilirubin: 0.3 mg/dL (ref 0.2–1.2)
Total Protein: 6.6 g/dL (ref 6.1–8.1)
eGFR: 65 mL/min/{1.73_m2} (ref >=60)

## 2020-10-28 LAB — LIPID PANEL
Cholesterol: 188 mg/dL (ref <200)
HDL: 59 mg/dL (ref >=40)
LDL Cholesterol (Calc): 105 mg/dL (calc) — ABNORMAL HIGH
Non-HDL Cholesterol (Calc): 129 mg/dL (calc) (ref <130)
Total CHOL/HDL Ratio: 3.2 (calc) (ref <5.0)
Triglycerides: 141 mg/dL (ref <150)

## 2020-10-28 LAB — URIC ACID: Uric Acid, Serum: 4 mg/dL (ref 4.0–8.0)

## 2020-10-28 MED ORDER — EZETIMIBE 10 MG PO TABS: 10.0000 mg | ORAL_TABLET | Freq: Every day | ORAL | 3 refills | Status: DC

## 2020-11-25 ENCOUNTER — Telehealth: Payer: Self-pay

## 2020-11-25 NOTE — Progress Notes (Signed)
Chronic Care Management Pharmacy Assistant   Name: NICOHLAS STJULIEN  MRN: [id number] DOB: 02-02-1951  Reason for Encounter: Hypertension Disease State Call/Schedule follow-up with Junius Argyle, CPP in Sept.  Recent office visits:  10/27/2020 Delsa Grana, PA-C (PCP Office Visit) for HTN, [place], HLD- Zetia discontinued; labs ordered; US aorta Duplex limited ordered- patient instructed to follow-up in 6-12 months.  Recent consult visits:  No recent consult visits  Hospital visits:  None in previous 6 months  Medications: Outpatient Encounter Medications as of 11/25/2020  Medication Sig   allopurinol (ZYLOPRIM) 100 MG tablet TAKE 1 TABLET(100 MG) BY MOUTH DAILY   aspirin EC 81 MG tablet Take 81 mg by mouth daily. Swallow whole.   ezetimibe (ZETIA) 10 MG tablet Take 1 tablet (10 mg total) by mouth daily.   latanoprost (XALATAN) 0.005 % ophthalmic solution Place 1 drop into the right eye daily.   lisinopril (ZESTRIL) 20 MG tablet Take 1 tablet (20 mg total) by mouth in the morning.   LUMIGAN 0.01 % SOLN Place 1 drop into the right eye at bedtime.    Multiple Vitamin (MULTIVITAMIN) tablet Take 1 tablet by mouth daily.   rosuvastatin (CRESTOR) 40 MG tablet TAKE 1 TABLET(40 MG) BY MOUTH DAILY   traZODone (DESYREL) 100 MG tablet TAKE 1/2 TO 1 TABLET BY MOUTH AT BEDTIME AS NEEDED FOR SLEEP   No facility-administered encounter medications on file as of 11/25/2020.   Care Gaps: Zoster Vaccines- Shingrix- vaccine ordered on 07/13 visit COVID-19 Vaccine Booster 4 INFLUENZA VACCINE  Star Rating Drugs: Lisinopril 20 mg last filled on 02/05/2020 for a 90-Day supply with [facility] Rosuvastatin 40 mg last filled on 02/09/2020 for a 90-Day supply with [facility]  Reviewed chart prior to disease state call. Spoke with patient regarding BP  Recent Office Vitals: BP Readings from Last 3 Encounters:  10/27/20 124/68  04/29/20 130/82  03/05/20 132/80   Pulse Readings from  Last 3 Encounters:  10/27/20 85  04/29/20 88  02/05/20 90    Wt Readings from Last 3 Encounters:  10/27/20 191 lb 9.6 oz (86.9 kg)  04/29/20 194 lb 1.6 oz (88 kg)  02/05/20 194 lb (88 kg)     Kidney Function Lab Results  Component Value Date/Time   CREATININE 1.20 10/27/2020 03:26 PM   CREATININE 1.01 04/29/2020 11:56 AM   GFRNONAA 76 04/29/2020 11:56 AM   GFRAA 88 04/29/2020 11:56 AM    BMP Latest Ref Rng & Units 10/27/2020 04/29/2020 08/27/2019  Glucose 65 - 99 mg/dL 86 96 104(H)  BUN 7 - 25 mg/dL 26(H) 24 17  Creatinine 0.70 - 1.28 mg/dL 1.20 1.01 1.02  BUN/Creat Ratio 6 - 22 (calc) 22 NOT APPLICABLE NOT APPLICABLE  Sodium A999333 - 146 mmol/L 141 141 141  Potassium 3.5 - 5.3 mmol/L 4.5 4.8 4.5  Chloride 98 - 110 mmol/L 109 106 107  CO2 20 - 32 mmol/L '24 27 27  '$ Calcium 8.6 - 10.3 mg/dL 9.3 9.7 9.0    Current antihypertensive regimen:  Lisinopril 20 mg 1 tablet daily  What recent interventions/DTPs have been made by any provider to improve Blood Pressure control since last CPP Visit: None ID  Any recent hospitalizations or ED visits since last visit with CPP? No  Adherence Review: Is the patient currently on ACE/ARB medication? Yes Does the patient have >5 day gap between last estimated fill dates? Yes  AWV scheduled for 03/15/2021 @ 1330 Last CPP visit was 03/15/2020  08/15 LVM requesting patient to return my call 08/18 LVM requesting patient to return my call 08/22 I have attempted without success to contact this patient by phone three times to complete his monthly disease state call, and to get him scheduled with Junius Argyle, CPP. I have left voice messages requesting this patient to return my calls.   Lynann Bologna, CPA/CMA Clinical Pharmacist Assistant Phone: [phone]

## 2021-01-23 ENCOUNTER — Other Ambulatory Visit: Payer: Self-pay | Admitting: Family Medicine

## 2021-01-23 DIAGNOSIS — M1000 Idiopathic gout, unspecified site: Secondary | ICD-10-CM

## 2021-01-23 NOTE — Telephone Encounter (Signed)
Requested Prescriptions  Pending Prescriptions Disp Refills  . allopurinol (ZYLOPRIM) 100 MG tablet [Pharmacy Med Name: ALLOPURINOL 100MG  TABLETS] 90 tablet 3    Sig: TAKE 1 TABLET(100 MG) BY MOUTH DAILY     Endocrinology:  Gout Agents Passed - 01/23/2021  4:14 PM      Passed - Uric Acid in normal range and within 360 days    Uric Acid, Serum  Date Value Ref Range Status  10/27/2020 4.0 4.0 - 8.0 mg/dL Final    Comment:    Therapeutic target for gout patients: <6.0 mg/dL .          Passed - Cr in normal range and within 360 days    Creat  Date Value Ref Range Status  10/27/2020 1.20 0.70 - 1.28 mg/dL Final         Passed - Valid encounter within last 12 months    Recent Outpatient Visits          2 months ago Essential hypertension   [facility], Kristeen Miss, PA-C   8 months ago Hyperlipidemia LDL goal <70   [facility] Delsa Grana, PA-C   11 months ago Essential hypertension   [facility] Delsa Grana, PA-C   1 year ago Hyperlipidemia LDL goal <70   [facility] Delsa Grana, PA-C   1 year ago Other fatigue   [facility] Delsa Grana, PA-C      Future Appointments            In 1 month  [facility], [place]   In 5 months Delsa Grana, PA-C [facility], [facility]

## 2021-02-28 ENCOUNTER — Other Ambulatory Visit: Payer: Self-pay | Admitting: Family Medicine

## 2021-02-28 DIAGNOSIS — I700 Atherosclerosis of aorta: Secondary | ICD-10-CM

## 2021-02-28 DIAGNOSIS — Z87891 Personal history of nicotine dependence: Secondary | ICD-10-CM

## 2021-02-28 DIAGNOSIS — E785 Hyperlipidemia, unspecified: Secondary | ICD-10-CM

## 2021-03-15 ENCOUNTER — Ambulatory Visit (INDEPENDENT_AMBULATORY_CARE_PROVIDER_SITE_OTHER): Payer: Medicare PPO

## 2021-03-15 VITALS — BP 128/82 | HR 87 | Temp 98.0°F | Resp 16 | Ht 69.0 in | Wt 192.8 lb

## 2021-03-15 DIAGNOSIS — Z0000 Encounter for general adult medical examination without abnormal findings: Secondary | ICD-10-CM | POA: Diagnosis not present

## 2021-03-15 NOTE — Patient Instructions (Signed)
Victor Knight , Thank you for taking time to come for your Medicare Wellness Visit. I appreciate your ongoing commitment to your health goals. Please review the following plan we discussed and let me know if I can assist you in the future.   Screening recommendations/referrals: Colonoscopy: done 2015; repeat in 2025 Recommended yearly ophthalmology/optometry visit for glaucoma screening and checkup Recommended yearly dental visit for hygiene and checkup  Vaccinations: Influenza vaccine: done 12/16/20 Pneumococcal vaccine: done 02/28/19 Tdap vaccine: done 09/29/15 Shingles vaccine: Shingrix discussed. Please contact your pharmacy for coverage information.  Covid-19: done 05/15/19, 06/12/19, 02/12/20 & 12/16/20; please bring a copy of your vaccine record to your next appointment for additional booster information   Conditions/risks identified: Keep up the great work!  Next appointment: Follow up in one year for your annual wellness visit.   Preventive Care 37 Years and Older, Male Preventive care refers to lifestyle choices and visits with your health care provider that can promote health and wellness. What does preventive care include? A yearly physical exam. This is also called an annual well check. Dental exams once or twice a year. Routine eye exams. Ask your health care provider how often you should have your eyes checked. Personal lifestyle choices, including: Daily care of your teeth and gums. Regular physical activity. Eating a healthy diet. Avoiding tobacco and drug use. Limiting alcohol use. Practicing safe sex. Taking low doses of aspirin every day. Taking vitamin and mineral supplements as recommended by your health care provider. What happens during an annual well check? The services and screenings done by your health care provider during your annual well check will depend on your age, overall health, lifestyle risk factors, and family history of disease. Counseling  Your  health care provider may ask you questions about your: Alcohol use. Tobacco use. Drug use. Emotional well-being. Home and relationship well-being. Sexual activity. Eating habits. History of falls. Memory and ability to understand (cognition). Work and work Statistician. Screening  You may have the following tests or measurements: Height, weight, and BMI. Blood pressure. Lipid and cholesterol levels. These may be checked every 5 years, or more frequently if you are over 7 years old. Skin check. Lung cancer screening. You may have this screening every year starting at age 15 if you have a 30-pack-year history of smoking and currently smoke or have quit within the past 15 years. Fecal occult blood test (FOBT) of the stool. You may have this test every year starting at age 60. Flexible sigmoidoscopy or colonoscopy. You may have a sigmoidoscopy every 5 years or a colonoscopy every 10 years starting at age 40. Prostate cancer screening. Recommendations will vary depending on your family history and other risks. Hepatitis C blood test. Hepatitis B blood test. Sexually transmitted disease (STD) testing. Diabetes screening. This is done by checking your blood sugar (glucose) after you have not eaten for a while (fasting). You may have this done every 1-3 years. Abdominal aortic aneurysm (AAA) screening. You may need this if you are a current or former smoker. Osteoporosis. You may be screened starting at age 93 if you are at high risk. Talk with your health care provider about your test results, treatment options, and if necessary, the need for more tests. Vaccines  Your health care provider may recommend certain vaccines, such as: Influenza vaccine. This is recommended every year. Tetanus, diphtheria, and acellular pertussis (Tdap, Td) vaccine. You may need a Td booster every 10 years. Zoster vaccine. You may need this after age  60. Pneumococcal 13-valent conjugate (PCV13) vaccine. One dose  is recommended after age 70. Pneumococcal polysaccharide (PPSV23) vaccine. One dose is recommended after age 82. Talk to your health care provider about which screenings and vaccines you need and how often you need them. This information is not intended to replace advice given to you by your health care provider. Make sure you discuss any questions you have with your health care provider. Document Released: 04/30/2015 Document Revised: 12/22/2015 Document Reviewed: 02/02/2015 Elsevier Interactive Patient Education  2017 [employer] Prevention in the Home Falls can cause injuries. They can happen to people of all ages. There are many things you can do to make your home safe and to help prevent falls. What can I do on the outside of my home? Regularly fix the edges of walkways and driveways and fix any cracks. Remove anything that might make you trip as you walk through a door, such as a raised step or threshold. Trim any bushes or trees on the path to your home. Use bright outdoor lighting. Clear any walking paths of anything that might make someone trip, such as rocks or tools. Regularly check to see if handrails are loose or broken. Make sure that both sides of any steps have handrails. Any raised decks and porches should have guardrails on the edges. Have any leaves, snow, or ice cleared regularly. Use sand or salt on walking paths during winter. Clean up any spills in your garage right away. This includes oil or grease spills. What can I do in the bathroom? Use night lights. Install grab bars by the toilet and in the tub and shower. Do not use towel bars as grab bars. Use non-skid mats or decals in the tub or shower. If you need to sit down in the shower, use a plastic, non-slip stool. Keep the floor dry. Clean up any water that spills on the floor as soon as it happens. Remove soap buildup in the tub or shower regularly. Attach bath mats securely with double-sided non-slip rug  tape. Do not have throw rugs and other things on the floor that can make you trip. What can I do in the bedroom? Use night lights. Make sure that you have a light by your bed that is easy to reach. Do not use any sheets or blankets that are too big for your bed. They should not hang down onto the floor. Have a firm chair that has side arms. You can use this for support while you get dressed. Do not have throw rugs and other things on the floor that can make you trip. What can I do in the kitchen? Clean up any spills right away. Avoid walking on wet floors. Keep items that you use a lot in easy-to-reach places. If you need to reach something above you, use a strong step stool that has a grab bar. Keep electrical cords out of the way. Do not use floor polish or wax that makes floors slippery. If you must use wax, use non-skid floor wax. Do not have throw rugs and other things on the floor that can make you trip. What can I do with my stairs? Do not leave any items on the stairs. Make sure that there are handrails on both sides of the stairs and use them. Fix handrails that are broken or loose. Make sure that handrails are as long as the stairways. Check any carpeting to make sure that it is firmly attached to the stairs. Fix  any carpet that is loose or worn. Avoid having throw rugs at the top or bottom of the stairs. If you do have throw rugs, attach them to the floor with carpet tape. Make sure that you have a light switch at the top of the stairs and the bottom of the stairs. If you do not have them, ask someone to add them for you. What else can I do to help prevent falls? Wear shoes that: Do not have high heels. Have rubber bottoms. Are comfortable and fit you well. Are closed at the toe. Do not wear sandals. If you use a stepladder: Make sure that it is fully opened. Do not climb a closed stepladder. Make sure that both sides of the stepladder are locked into place. Ask someone to  hold it for you, if possible. Clearly mark and make sure that you can see: Any grab bars or handrails. First and last steps. Where the edge of each step is. Use tools that help you move around (mobility aids) if they are needed. These include: Canes. Walkers. Scooters. Crutches. Turn on the lights when you go into a dark area. Replace any light bulbs as soon as they burn out. Set up your furniture so you have a clear path. Avoid moving your furniture around. If any of your floors are uneven, fix them. If there are any pets around you, be aware of where they are. Review your medicines with your doctor. Some medicines can make you feel dizzy. This can increase your chance of falling. Ask your doctor what other things that you can do to help prevent falls. This information is not intended to replace advice given to you by your health care provider. Make sure you discuss any questions you have with your health care provider. Document Released: 01/28/2009 Document Revised: 09/09/2015 Document Reviewed: 05/08/2014 Elsevier Interactive Patient Education  2017 [employer].

## 2021-03-15 NOTE — Progress Notes (Signed)
Subjective:   Victor Knight is a 70 y.o. male who presents for Medicare Annual/Subsequent preventive examination.  Review of Systems     Cardiac Risk Factors include: advanced age (>85men, >45 women);dyslipidemia;hypertension     Objective:    Today's Vitals   03/15/21 1336  BP: 128/82  Pulse: 87  Resp: 16  Temp: 98 F (36.7 C)  TempSrc: Oral  SpO2: 98%  Weight: 192 lb 12.8 oz (87.5 kg)  Height: 5\' 9"  (1.753 m)   Body mass index is 28.47 kg/m.  Advanced Directives 03/15/2021 03/09/2020 06/17/2019 05/16/2019 04/28/2019 03/07/2019 02/12/2017  Does Patient Have a Medical Advance Directive? Yes Yes Yes Yes Yes Yes Yes  Type of Paramedic of [place];Living will [place];Living will [place];Living will [place];Living will [place];Living will [place];Living will -  Does patient want to make changes to medical advance directive? - - No - Patient declined No - Patient declined - - -  Copy of [place] in Chart? Yes - validated most recent copy scanned in chart (See row information) Yes - validated most recent copy scanned in chart (See row information) No - copy requested No - copy requested - Yes - validated most recent copy scanned in chart (See row information) -    Current Medications (verified) Outpatient Encounter Medications as of 03/15/2021  Medication Sig   allopurinol (ZYLOPRIM) 100 MG tablet TAKE 1 TABLET(100 MG) BY MOUTH DAILY   aspirin EC 81 MG tablet Take 81 mg by mouth daily. Swallow whole.   ezetimibe (ZETIA) 10 MG tablet Take 1 tablet (10 mg total) by mouth daily.   latanoprost (XALATAN) 0.005 % ophthalmic solution Place 1 drop into the right eye daily.   lisinopril (ZESTRIL) 20 MG tablet Take 1 tablet (20 mg total) by mouth in the morning.   Multiple Vitamin (MULTIVITAMIN) tablet Take 1 tablet by mouth daily.    rosuvastatin (CRESTOR) 40 MG tablet TAKE 1 TABLET(40 MG) BY MOUTH DAILY   traZODone (DESYREL) 100 MG tablet TAKE 1/2 TO 1 TABLET BY MOUTH AT BEDTIME AS NEEDED FOR SLEEP   [DISCONTINUED] LUMIGAN 0.01 % SOLN Place 1 drop into the right eye at bedtime.    No facility-administered encounter medications on file as of 03/15/2021.    Allergies (verified) Keflex [cephalexin]   History: Past Medical History:  Diagnosis Date   Abdominal aortic atherosclerosis ([place]) 10/27/2015   Aortic ectasia, abdominal ([place]) 10/27/2015   Gout    Hyperlipidemia    Hypertension    Insomnia    Past Surgical History:  Procedure Laterality Date   CARPAL TUNNEL RELEASE     rotator cuff surgery   04/20/2016   right arm; [facility]    TRIGGER FINGER RELEASE     Family History  Problem Relation Age of Onset   Hypertension Father    Heart disease Father    Hypertension Brother    Hypertension Sister    Supraventricular tachycardia Daughter    Heart disease Daughter    Hypertension Brother    Hypertension Brother    Hypertension Brother    Heart disease Brother    Social History   Socioeconomic History   Marital status: Married    Spouse name: Victor Knight   Number of children: 1   Years of education: Not on file   Highest education level: High school graduate  Occupational History   Occupation: Retired  Tobacco Use  Smoking status: Former   Smokeless tobacco: Never  Vaping Use   Vaping Use: Never used  Substance and Sexual Activity   Alcohol use: Yes    Alcohol/week: 7.0 standard drinks    Types: 7 Glasses of wine per week   Drug use: No   Sexual activity: Yes    Partners: Female  Other Topics Concern   Not on file  Social History Narrative   Not on file   Social Determinants of Health   Financial Resource Strain: Low Risk    Difficulty of Paying Living Expenses: Not hard at all  Food Insecurity: No Food Insecurity   Worried About Charity fundraiser in the Last Year: Never true   [place] in the Last Year: Never true  Transportation Needs: No Transportation Needs   Lack of Transportation (Medical): No   Lack of Transportation (Non-Medical): No  Physical Activity: Sufficiently Active   Days of Exercise per Week: 3 days   Minutes of Exercise per Session: 60 min  Stress: No Stress Concern Present   Feeling of Stress : Not at all  Social Connections: Socially Integrated   Frequency of Communication with Friends and Family: More than three times a week   Frequency of Social Gatherings with Friends and Family: Three times a week   Attends Religious Services: More than 4 times per year   Active Member of Clubs or Organizations: Yes   Attends Music therapist: More than 4 times per year   Marital Status: Married    Tobacco Counseling Counseling given: Not Answered   Clinical Intake:  Pre-visit preparation completed: Yes  Pain : No/denies pain     BMI - recorded: 28.47 Nutritional Status: BMI 25 -29 Overweight Nutritional Risks: None Diabetes: No  How often do you need to have someone help you when you read instructions, pamphlets, or other written materials from your doctor or pharmacy?: 1 - Never    Interpreter Needed?: No  Information entered by :: Victor Marker LPN   Activities of Daily Living In your present state of health, do you have any difficulty performing the following activities: 03/15/2021 10/27/2020  Hearing? N N  Vision? N N  Difficulty concentrating or making decisions? N N  Walking or climbing stairs? N N  Dressing or bathing? N N  Doing errands, shopping? N N  Preparing Food and eating ? N -  Using the Toilet? N -  In the past six months, have you accidently leaked urine? N -  Do you have problems with loss of bowel control? N -  Managing your Medications? N -  Managing your Finances? N -  Housekeeping or managing your Housekeeping? N -  Some recent data might be hidden    Patient Care Team: Victor Grana, PA-C  as PCP - General (Family Medicine) Victor Bathe, MD (Dermatology) Victor Cotta, MD (Ophthalmology) Victor Knight, [facility] as Pharmacist (Pharmacist)  Indicate any recent Medical Services you may have received from other than [facility] providers in the past year (date may be approximate).     Assessment:   This is a routine wellness examination for [name].  Hearing/Vision screen Hearing Screening - Comments:: Pt denies hearing difficulty Vision Screening - Comments:: [facility] vision screenings [facility] at [facility]  Dietary issues and exercise activities discussed: Current Exercise Habits: Home exercise routine, Type of exercise: walking, Time (Minutes): 60, Frequency (Times/Week): 3, Weekly Exercise (Minutes/Week): 180, Intensity: Moderate, Exercise limited by: None identified  Goals Addressed               This Visit's Progress     DIET - INCREASE WATER INTAKE (pt-stated)   On track      Depression Screen PHQ 2/9 Scores 03/15/2021 10/27/2020 04/29/2020 03/09/2020 02/05/2020 01/07/2020 08/22/2019  PHQ - 2 Score 0 0 0 0 0 0 0  PHQ- 9 Score - 0 - - 0 - 0    Fall Risk Fall Risk  03/15/2021 10/27/2020 04/29/2020 03/09/2020 02/05/2020  Falls in the past year? 0 0 0 0 0  Number falls in past yr: 0 0 0 0 0  Injury with Fall? 0 0 0 0 0  Risk for fall due to : No Fall Risks - - No Fall Risks -  Follow up Falls prevention discussed - Falls evaluation completed Falls prevention discussed -    FALL RISK PREVENTION PERTAINING TO THE HOME:  Any stairs in or around the home? Yes  If so, are there any without handrails? No  Home free of loose throw rugs in walkways, pet beds, electrical cords, etc? Yes  Adequate lighting in your home to reduce risk of falls? Yes   ASSISTIVE DEVICES UTILIZED TO PREVENT FALLS:  Life alert? No  Use of a cane, walker or w/c? No  Grab bars in the bathroom? Yes  Shower chair or bench in shower? Yes  Elevated toilet seat or a handicapped toilet?  Yes   TIMED UP AND GO:  Was the test performed? Yes .  Length of time to ambulate 10 feet: 5 sec.   Gait steady and fast without use of assistive device  Cognitive Function: Normal cognitive status assessed by direct observation by this Nurse Health Advisor. No abnormalities found.       6CIT Screen 07/03/2017  What Year? 0 points  What month? 0 points  What time? 0 points  Count back from 20 0 points  Months in reverse 0 points  Repeat phrase 0 points  Total Score 0    Immunizations Immunization History  Administered Date(s) Administered   Fluad Quad(high Dose 65+) 01/23/2019, 01/07/2020   Influenza, High Dose Seasonal PF 01/08/2017, 02/02/2018, 12/16/2020   Influenza-Unspecified 12/17/2015   Moderna Covid-19 Vaccine Bivalent Booster 63yrs & up 12/16/2020   Moderna Sars-Covid-2 Vaccination 05/15/2019, 06/12/2019, 02/12/2020   Pneumococcal Conjugate-13 10/05/2015, 01/08/2017   Pneumococcal Polysaccharide-23 02/28/2019   Tdap 09/29/2015    TDAP status: Up to date  Flu Vaccine status: Up to date  Pneumococcal vaccine status: Up to date  Covid-19 vaccine status: Completed vaccines  Qualifies for Shingles Vaccine? Yes   Zostavax completed No   Shingrix Completed?: No.    Education has been provided regarding the importance of this vaccine. Patient has been advised to call insurance company to determine out of pocket expense if they have not yet received this vaccine. Advised may also receive vaccine at local pharmacy or Health Dept. Verbalized acceptance and understanding.  Screening Tests Health Maintenance  Topic Date Due   Zoster Vaccines- Shingrix (1 of 2) Never done   COLONOSCOPY (Pts 45-65yrs Insurance coverage will need to be confirmed)  04/18/2023   TETANUS/TDAP  09/28/2025   Pneumonia Vaccine 83+ Years old  Completed   INFLUENZA VACCINE  Completed   COVID-19 Vaccine  Completed   Hepatitis C Screening  Completed   HPV VACCINES  Aged Out    Health  Maintenance  Health Maintenance Due  Topic Date Due   Zoster Vaccines- Shingrix (1 of  2) Never done    Colorectal cancer screening: Type of screening: Colonoscopy. Completed 2015. Repeat every 10 years  Lung Cancer Screening: (Low Dose CT Chest recommended if Age 35-80 years, 30 pack-year currently smoking OR have quit w/in 15years.) does not qualify.   Additional Screening:  Hepatitis C Screening: does qualify; Completed 02/26/19  Vision Screening: Recommended annual ophthalmology exams for early detection of glaucoma and other disorders of the eye. Is the patient up to date with their annual eye exam?  Yes  Who is the provider or what is the name of the office in which the patient attends annual eye exams? [facility].   Dental Screening: Recommended annual dental exams for proper oral hygiene  Community Resource Referral / Chronic Care Management: CRR required this visit?  No   CCM required this visit?  No      Plan:     I have personally reviewed and noted the following in the patient's chart:   Medical and social history Use of alcohol, tobacco or illicit drugs  Current medications and supplements including opioid prescriptions. Patient is not currently taking opioid prescriptions. Functional ability and status Nutritional status Physical activity Advanced directives List of other physicians Hospitalizations, surgeries, and ER visits in previous 12 months Vitals Screenings to include cognitive, depression, and falls Referrals and appointments  In addition, I have reviewed and discussed with patient certain preventive protocols, quality metrics, and best practice recommendations. A written personalized care plan for preventive services as well as general preventive health recommendations were provided to patient.     Victor Marker, LPN   [id number]   Nurse Notes: none

## 2021-04-20 DIAGNOSIS — H401131 Primary open-angle glaucoma, bilateral, mild stage: Secondary | ICD-10-CM | POA: Diagnosis not present

## 2021-04-20 DIAGNOSIS — H524 Presbyopia: Secondary | ICD-10-CM | POA: Diagnosis not present

## 2021-06-27 ENCOUNTER — Encounter: Payer: Self-pay | Admitting: Family Medicine

## 2021-06-27 ENCOUNTER — Ambulatory Visit: Payer: Medicare PPO | Admitting: Family Medicine

## 2021-06-27 VITALS — BP 134/72 | HR 94 | Temp 97.7°F | Resp 16 | Ht 69.0 in | Wt 194.1 lb

## 2021-06-27 DIAGNOSIS — Z5181 Encounter for therapeutic drug level monitoring: Secondary | ICD-10-CM | POA: Diagnosis not present

## 2021-06-27 DIAGNOSIS — I10 Essential (primary) hypertension: Secondary | ICD-10-CM | POA: Diagnosis not present

## 2021-06-27 DIAGNOSIS — I77819 Aortic ectasia, unspecified site: Secondary | ICD-10-CM

## 2021-06-27 DIAGNOSIS — H401130 Primary open-angle glaucoma, bilateral, stage unspecified: Secondary | ICD-10-CM

## 2021-06-27 DIAGNOSIS — E785 Hyperlipidemia, unspecified: Secondary | ICD-10-CM | POA: Diagnosis not present

## 2021-06-27 DIAGNOSIS — G4700 Insomnia, unspecified: Secondary | ICD-10-CM | POA: Diagnosis not present

## 2021-06-27 DIAGNOSIS — Z7902 Long term (current) use of antithrombotics/antiplatelets: Secondary | ICD-10-CM | POA: Diagnosis not present

## 2021-06-27 DIAGNOSIS — Z23 Encounter for immunization: Secondary | ICD-10-CM

## 2021-06-27 DIAGNOSIS — I2510 Atherosclerotic heart disease of native coronary artery without angina pectoris: Secondary | ICD-10-CM | POA: Diagnosis not present

## 2021-06-27 DIAGNOSIS — M1000 Idiopathic gout, unspecified site: Secondary | ICD-10-CM | POA: Diagnosis not present

## 2021-06-27 LAB — CBC WITH DIFFERENTIAL/PLATELET
Absolute Monocytes: 582 cells/uL (ref 200–950)
Basophils Absolute: 42 cells/uL (ref 0–200)
Basophils Relative: 0.7 %
Eosinophils Absolute: 102 cells/uL (ref 15–500)
Eosinophils Relative: 1.7 %
HCT: 46.9 % (ref 38.5–50.0)
Hemoglobin: 15.6 g/dL (ref 13.2–17.1)
Lymphs Abs: 1926 cells/uL (ref 850–3900)
MCH: 30.5 pg (ref 27.0–33.0)
MCHC: 33.3 g/dL (ref 32.0–36.0)
MCV: 91.6 fL (ref 80.0–100.0)
MPV: 9.8 fL (ref 7.5–12.5)
Monocytes Relative: 9.7 %
Neutro Abs: 3348 cells/uL (ref 1500–7800)
Neutrophils Relative %: 55.8 %
Platelets: 173 10*3/uL (ref 140–400)
RBC: 5.12 10*6/uL (ref 4.20–5.80)
RDW: 12.4 % (ref 11.0–15.0)
Total Lymphocyte: 32.1 %
WBC: 6 10*3/uL (ref 3.8–10.8)

## 2021-06-27 LAB — LIPID PANEL
Cholesterol: 148 mg/dL (ref <200)
HDL: 60 mg/dL (ref >=40)
LDL Cholesterol (Calc): 69 mg/dL (calc)
Non-HDL Cholesterol (Calc): 88 mg/dL (calc) (ref <130)
Total CHOL/HDL Ratio: 2.5 (calc) (ref <5.0)
Triglycerides: 104 mg/dL (ref <150)

## 2021-06-27 LAB — COMPLETE METABOLIC PANEL WITH GFR
AG Ratio: 2 (calc) (ref 1.0–2.5)
ALT: 38 U/L (ref 9–46)
AST: 41 U/L — ABNORMAL HIGH (ref 10–35)
Albumin: 4.5 g/dL (ref 3.6–5.1)
Alkaline phosphatase (APISO): 40 U/L (ref 35–144)
BUN: 18 mg/dL (ref 7–25)
CO2: 27 mmol/L (ref 20–32)
Calcium: 9.2 mg/dL (ref 8.6–10.3)
Chloride: 105 mmol/L (ref 98–110)
Creat: 1.15 mg/dL (ref 0.70–1.28)
Globulin: 2.3 g/dL (calc) (ref 1.9–3.7)
Glucose, Bld: 100 mg/dL — ABNORMAL HIGH (ref 65–99)
Potassium: 4.5 mmol/L (ref 3.5–5.3)
Sodium: 140 mmol/L (ref 135–146)
Total Bilirubin: 0.6 mg/dL (ref 0.2–1.2)
Total Protein: 6.8 g/dL (ref 6.1–8.1)
eGFR: 68 mL/min/{1.73_m2} (ref >=60)

## 2021-06-27 NOTE — Progress Notes (Signed)
Name: Victor Knight   MRN: [id number]    DOB: [birth year]   Date:06/27/2021       Progress Note  Chief Complaint  Patient presents with   Follow-up   Hyperlipidemia   Hypertension   Insomnia     Subjective:   Victor Knight is a 71 y.o. male, presents to clinic for routine f/up - last appt July 2022   Hypertension:  Currently managed on lisinopril 20 mg daily Pt reports good med compliance and denies any SE.   Blood pressure today is well controlled. At home usually in 120's BP Readings from Last 3 Encounters:  06/27/21 134/72  03/15/21 128/82  10/27/20 124/68   Pt denies CP, SOB, exertional sx, LE edema, palpitation, Ha's, visual disturbances, lightheadedness, hypotension, syncope.  HLD on crestor 40 mg, restarted zetia, no SE or concerns Lab Results  Component Value Date   CHOL 188 10/27/2020   HDL 59 10/27/2020   LDLCALC 105 (H) 10/27/2020   TRIG 141 10/27/2020   CHOLHDL 3.2 10/27/2020   Hx of CAD per imagine, no cardiac sx or prior work up per pt, hx of abd aortic atherosclerosis and ectasia - did not do f/up imaging ordered and due for last year - discussed with pt  On ASA, prior bilateral PE - cleared by pulm and hemonc Gout - on 100 mg allopurinol, last uric acid levels for several years low 4-5, highest in chart is 6.2, no flares in years and years, changed diet (red meat, pork, wine all cut out)  Insomnia well controlled on trazodone 100 mg daily     Current Outpatient Medications:    allopurinol (ZYLOPRIM) 100 MG tablet, TAKE 1 TABLET(100 MG) BY MOUTH DAILY, Disp: 90 tablet, Rfl: 3   aspirin EC 81 MG tablet, Take 81 mg by mouth daily. Swallow whole., Disp: , Rfl:    ezetimibe (ZETIA) 10 MG tablet, Take 1 tablet (10 mg total) by mouth daily., Disp: 90 tablet, Rfl: 3   latanoprost (XALATAN) 0.005 % ophthalmic solution, Place 1 drop into the right eye daily., Disp: , Rfl:    lisinopril (ZESTRIL) 20 MG tablet, Take 1 tablet (20 mg total) by mouth in  the morning., Disp: 90 tablet, Rfl: 3   Multiple Vitamin (MULTIVITAMIN) tablet, Take 1 tablet by mouth daily., Disp: , Rfl:    rosuvastatin (CRESTOR) 40 MG tablet, TAKE 1 TABLET(40 MG) BY MOUTH DAILY, Disp: 90 tablet, Rfl: 3   traZODone (DESYREL) 100 MG tablet, TAKE 1/2 TO 1 TABLET BY MOUTH AT BEDTIME AS NEEDED FOR SLEEP, Disp: 90 tablet, Rfl: 3  Patient Active Problem List   Diagnosis Date Noted   Aortic atherosclerosis ([place]) 04/29/2020   History of pulmonary embolus (PE) 04/29/2020   Coronary artery disease involving native heart without angina pectoris 04/29/2020   Abdominal aortic atherosclerosis ([place]) 10/27/2015   Aortic ectasia, abdominal ([place]) 10/27/2015   Hx of smoking 10/05/2015   History of hepatitis C 10/05/2015   Hypertension 10/05/2014   Insomnia 10/05/2014   Hyperlipidemia LDL goal <70 10/05/2014   Gout 10/05/2014    Past Surgical History:  Procedure Laterality Date   CARPAL TUNNEL RELEASE     rotator cuff surgery   04/20/2016   right arm; [facility]    TRIGGER FINGER RELEASE      Family History  Problem Relation Age of Onset   Hypertension Father    Heart disease Father    Hypertension Brother    Hypertension Sister    Supraventricular  tachycardia Daughter    Heart disease Daughter    Hypertension Brother    Hypertension Brother    Hypertension Brother    Heart disease Brother     Social History   Tobacco Use   Smoking status: Former   Smokeless tobacco: Never  Scientific laboratory technician Use: Never used  Substance Use Topics   Alcohol use: Yes    Alcohol/week: 7.0 standard drinks    Types: 7 Glasses of wine per week   Drug use: No     Allergies  Allergen Reactions   Keflex [Cephalexin] Rash    Health Maintenance  Topic Date Due   Zoster Vaccines- Shingrix (1 of 2) 09/27/2021 (Originally 09/04/2000)   COLONOSCOPY (Pts 45-28yr Insurance coverage will need to be confirmed)  04/18/2023   TETANUS/TDAP  09/28/2025   Pneumonia Vaccine 71 Years old   Completed   INFLUENZA VACCINE  Completed   COVID-19 Vaccine  Completed   Hepatitis C Screening  Completed   HPV VACCINES  Aged Out    Chart Review Today: I personally reviewed active problem list, medication list, allergies, family history, social history, health maintenance, notes from last encounter, lab results, imaging with the patient/caregiver today.   Review of Systems  Constitutional: Negative.   HENT: Negative.    Eyes: Negative.   Respiratory: Negative.    Cardiovascular: Negative.   Gastrointestinal: Negative.   Endocrine: Negative.   Genitourinary: Negative.   Musculoskeletal: Negative.   Skin: Negative.   Allergic/Immunologic: Negative.   Neurological: Negative.   Hematological: Negative.   Psychiatric/Behavioral: Negative.    All other systems reviewed and are negative.   Objective:   Vitals:   06/27/21 0922 06/27/21 0924  BP: (!) 144/86 134/72  Pulse: 94   Resp: 16   Temp: 97.7 F (36.5 C)   TempSrc: Oral   SpO2: 98%   Weight: 194 lb 1.6 oz (88 kg)   Height: '5\' 9"'$  (1.753 m)     Body mass index is 28.66 kg/m.  Physical Exam Vitals and nursing note reviewed.  Constitutional:      General: He is not in acute distress.    Appearance: Normal appearance. He is well-developed. He is not ill-appearing, toxic-appearing or diaphoretic.     Interventions: Face mask in place.  HENT:     Head: Normocephalic and atraumatic.     Jaw: No trismus.     Right Ear: External ear normal.     Left Ear: External ear normal.  Eyes:     General: Lids are normal. No scleral icterus.       Right eye: No discharge.        Left eye: No discharge.     Conjunctiva/sclera: Conjunctivae normal.  Neck:     Trachea: Trachea and phonation normal. No tracheal deviation.  Cardiovascular:     Rate and Rhythm: Normal rate and regular rhythm.     Pulses: Normal pulses.          Radial pulses are 2+ on the right side and 2+ on the left side.       Posterior tibial pulses are  2+ on the right side and 2+ on the left side.     Heart sounds: Normal heart sounds. No murmur heard.   No friction rub. No gallop.  Pulmonary:     Effort: Pulmonary effort is normal. No respiratory distress.     Breath sounds: Normal breath sounds. No stridor. No wheezing, rhonchi or rales.  Abdominal:  General: Bowel sounds are normal. There is no distension.     Palpations: Abdomen is soft.     Tenderness: There is no abdominal tenderness. There is no guarding.  Musculoskeletal:     Cervical back: Normal range of motion and neck supple.     Right lower leg: No edema.     Left lower leg: No edema.  Skin:    General: Skin is warm and dry.     Coloration: Skin is not jaundiced.     Findings: No rash.     Nails: There is no clubbing.  Neurological:     Mental Status: He is alert. Mental status is at baseline.     Cranial Nerves: No dysarthria or facial asymmetry.     Motor: No tremor or abnormal muscle tone.     Gait: Gait normal.  Psychiatric:        Mood and Affect: Mood normal.        Speech: Speech normal.        Behavior: Behavior normal. Behavior is cooperative.        Assessment & Plan:     ICD-10-CM   1. Essential hypertension  R44 COMPLETE METABOLIC PANEL WITH GFR    Lipid panel   well controlled with lisinopril 20 mg daily    2. Hyperlipidemia LDL goal <70  R15.4 COMPLETE METABOLIC PANEL WITH GFR    Lipid panel   added back zetia, recheck    3. Idiopathic gout, unspecified chronicity, unspecified site  M10.00    discussed low uric acid level and infrequent flares, none in years - option proposed to pt to try weaning of allopurinol     4. Insomnia, unspecified type  G47.00    stable - continue trazodone    5. Atherosclerosis of coronary artery of native heart without angina pectoris, unspecified vessel or lesion type  I25.10    per imaging, denies any cardiac sx    6. Ectatic aorta (HCC)  I77.819    due for f/up US (5 year f/up due 7/22)    7.  Medication monitoring encounter  M08.67 COMPLETE METABOLIC PANEL WITH GFR    Lipid panel    CBC with Differential/Platelet    8. Need for shingles vaccine  Z23     9. Encounter for monitoring antiplatelet therapy  Z51.81 CBC with Differential/Platelet   Z79.02    reviewed prior CBC, no sx or signs of bleeding    10. Primary open angle glaucoma of both eyes, unspecified glaucoma stage  H40.1130        Return in about 6 months (around 12/28/2021).   Delsa Grana, PA-C 06/27/21 9:51 AM

## 2021-07-11 ENCOUNTER — Ambulatory Visit: Payer: Medicare PPO | Admitting: Dermatology

## 2021-07-11 ENCOUNTER — Other Ambulatory Visit: Payer: Self-pay

## 2021-07-11 ENCOUNTER — Encounter: Payer: Self-pay | Admitting: Dermatology

## 2021-07-11 DIAGNOSIS — L820 Inflamed seborrheic keratosis: Secondary | ICD-10-CM | POA: Diagnosis not present

## 2021-07-11 DIAGNOSIS — D229 Melanocytic nevi, unspecified: Secondary | ICD-10-CM

## 2021-07-11 DIAGNOSIS — L821 Other seborrheic keratosis: Secondary | ICD-10-CM

## 2021-07-11 DIAGNOSIS — D1800 Hemangioma unspecified site: Secondary | ICD-10-CM | POA: Diagnosis not present

## 2021-07-11 DIAGNOSIS — Z1283 Encounter for screening for malignant neoplasm of skin: Secondary | ICD-10-CM

## 2021-07-11 DIAGNOSIS — L578 Other skin changes due to chronic exposure to nonionizing radiation: Secondary | ICD-10-CM

## 2021-07-11 DIAGNOSIS — L570 Actinic keratosis: Secondary | ICD-10-CM | POA: Diagnosis not present

## 2021-07-11 DIAGNOSIS — Z86018 Personal history of other benign neoplasm: Secondary | ICD-10-CM | POA: Diagnosis not present

## 2021-07-11 NOTE — Patient Instructions (Signed)
Cryotherapy Aftercare ? ?Wash gently with soap and water everyday.   ?Apply Vaseline and Band-Aid daily until healed.  ? ?Prior to procedure, discussed risks of blister formation, small wound, skin dyspigmentation, or rare scar following cryotherapy. Recommend Vaseline ointment to treated areas while healing.  ? ?Recommend daily broad spectrum sunscreen SPF 30+ to sun-exposed areas, reapply every 2 hours as needed. Call for new or changing lesions.  ?Staying in the shade or wearing long sleeves, sun glasses (UVA+UVB protection) and wide brim hats (4-inch brim around the entire circumference of the hat) are also recommended for sun protection.  ? ? ?Melanoma ABCDEs ? ?Melanoma is the most dangerous type of skin cancer, and is the leading cause of death from skin disease.  You are more likely to develop melanoma if you: ?Have light-colored skin, light-colored eyes, or red or blond hair ?Spend a lot of time in the sun ?Tan regularly, either outdoors or in a tanning bed ?Have had blistering sunburns, especially during childhood ?Have a close family member who has had a melanoma ?Have atypical moles or large birthmarks ? ?Early detection of melanoma is key since treatment is typically straightforward and cure rates are extremely high if we catch it early.  ? ?The first sign of melanoma is often a change in a mole or a new dark spot.  The ABCDE system is a way of remembering the signs of melanoma. ? ?A for asymmetry:  The two halves do not match. ?B for border:  The edges of the growth are irregular. ?C for color:  A mixture of colors are present instead of an even brown color. ?D for diameter:  Melanomas are usually (but not always) greater than 79m - the size of a pencil eraser. ?E for evolution:  The spot keeps changing in size, shape, and color. ? ?Please check your skin once per month between visits. You can use a small mirror in front and a large mirror behind you to keep an eye on the back side or your body.  ? ?If  you see any new or changing lesions before your next follow-up, please call to schedule a visit. ? ?Please continue daily skin protection including broad spectrum sunscreen SPF 30+ to sun-exposed areas, reapplying every 2 hours as needed when you're outdoors.  ? ?Staying in the shade or wearing long sleeves, sun glasses (UVA+UVB protection) and wide brim hats (4-inch brim around the entire circumference of the hat) are also recommended for sun protection.   ? ?If You Need Anything After Your Visit ? ?If you have any questions or concerns for your doctor, please call our main line at [phone] press option 4 to reach your doctor's medical assistant. If no one answers, please leave a voicemail as directed and we will return your call as soon as possible. Messages left after 4 pm will be answered the following business day.  ? ?You may also send [place] message via MyChart. We typically respond to MyChart messages within 1-2 business days. ? ?For prescription refills, please ask your pharmacy to contact our office. Our fax number is [phone] ? ?If you have an urgent issue when the clinic is closed that cannot wait until the next business day, you can page your doctor at the number below.   ? ?Please note that while we do our best to be available for urgent issues outside of office hours, we are not available 24/7.  ? ?If you have an urgent issue and are unable  to reach [place], you may choose to seek medical care at your doctor's office, retail clinic, urgent care center, or emergency room. ? ?If you have a medical emergency, please immediately call 911 or go to the emergency department. ? ?Pager Numbers ? ?- Dr. Nehemiah Massed: [phone] ? ?- Dr. Laurence Ferrari: [phone] ? ?- Dr. Nicole Kindred: [phone] ? ?In the event of inclement weather, please call our main line at [phone] for an update on the status of any delays or closures. ? ?Dermatology Medication Tips: ?Please keep the boxes that topical medications come in in  order to help keep track of the instructions about where and how to use these. Pharmacies typically print the medication instructions only on the boxes and not directly on the medication tubes.  ? ?If your medication is too expensive, please contact our office at [phone] option 4 or send [place] a message through [place].  ? ?We are unable to tell what your co-pay for medications will be in advance as this is different depending on your insurance coverage. However, we may be able to find a substitute medication at lower cost or fill out paperwork to get insurance to cover a needed medication.  ? ?If a prior authorization is required to get your medication covered by your insurance company, please allow [place] 1-2 business days to complete this process. ? ?Drug prices often vary depending on where the prescription is filled and some pharmacies may offer cheaper prices. ? ?The website www.goodrx.com contains coupons for medications through different pharmacies. The prices here do not account for what the cost may be with help from insurance (it may be cheaper with your insurance), but the website can give you the price if you did not use any insurance.  ?- You can print the associated coupon and take it with your prescription to the pharmacy.  ?- You may also stop by our office during regular business hours and pick up a GoodRx coupon card.  ?- If you need your prescription sent electronically to a different pharmacy, notify our office through [facility] or by phone at [phone] option 4. ? ? ? ? ?Si Usted Necesita Algo Despu?s de Su Visita ? ?Tambi?n puede enviarnos un mensaje a trav?s de MyChart. Por lo general respondemos a los mensajes de MyChart en el transcurso de 1 a 2 d?as h?biles. ? ?Para renovar recetas, por favor pida a su farmacia que se ponga en contacto con nuestra oficina. Nuestro n?mero de fax es el [phone]. ? ?Si tiene un asunto urgente cuando la cl?nica est? cerrada y que no puede  esperar hasta el siguiente d?a h?bil, puede llamar/localizar a su doctor(a) al n?mero que aparece a continuaci?n.  ? ?Por favor, tenga en cuenta que aunque hacemos todo lo posible para estar disponibles para asuntos urgentes fuera del horario de oficina, no estamos disponibles las 24 horas del d?a, los 7 d?as de la semana.  ? ?Si tiene un problema urgente y no puede comunicarse con nosotros, puede optar por buscar atenci?n m?dica  en el consultorio de su doctor(a), en una cl?nica privada, en un centro de atenci?n urgente o en una sala de emergencias. ? ?Si tiene Engineer, maintenance (IT) m?dica, por favor llame inmediatamente al 911 o vaya a la sala de emergencias. ? ?N?meros de b?per ? ?- Dr. Nehemiah Massed: [phone] ? ?- Dra. Moye: [phone] ? ?- Dra. Nicole Kindred: [phone] ? ?En caso de inclemencias del tiempo, por favor llame a nuestra l?nea principal al [phone] para una actualizaci?n sobre el [place] de  cualquier retraso o cierre. ? ?Consejos para la medicaci?n en dermatolog?a: ?Por favor, guarde las cajas en las que vienen los medicamentos de uso t?pico para ayudarle a seguir las instrucciones sobre d?nde y c?mo usarlos. Las farmacias generalmente imprimen las instrucciones del medicamento s?lo en las cajas y no directamente en los tubos del [place].  ? ?Si su medicamento es muy caro, por favor, p?ngase en contacto con Zigmund Daniel llamando al [phone] y presione la opci?n 4 o env?enos un mensaje a trav?s de MyChart.  ? ?No podemos decirle cu?l ser? su copago por los medicamentos por adelantado ya que esto es diferente dependiendo de la cobertura de su seguro. Sin embargo, es posible que podamos encontrar un medicamento sustituto a Electrical engineer un formulario para que el seguro cubra el medicamento que se considera necesario.  ? ?Si se requiere Ardelia Mems autorizaci?n previa para que su compa??a de seguros [place] su medicamento, por favor perm?tanos de 1 a 2 d?as h?biles para completar este proceso. ? ?Los  precios de los medicamentos var?an con frecuencia dependiendo del Environmental consultant de d?nde se surte la receta y alguna farmacias pueden ofrecer precios m?s baratos. ? ?El sitio web www.goodrx.com tiene cupones para

## 2021-07-11 NOTE — Progress Notes (Signed)
? ?New Patient Visit ? ?Subjective  ?Victor Knight is a 71 y.o. male who presents for the following: Annual Exam (HxDN. Has not been seen since 2019. Has areas of concern). ?The patient presents for Upper Body Skin Exam (UBSE) for skin cancer screening and mole check.  The patient has spots, moles and lesions to be evaluated, some may be new or changing and the patient has concerns that these could be cancer. ? ?Review of Systems: No other skin or systemic complaints except as noted in HPI or Assessment and Plan. ? ?Objective  ?Well appearing patient in no apparent distress; mood and affect are within normal limits. ? ?All skin waist up examined. ? ?right parasternal x1 ?Erythematous keratotic or waxy stuck-on papule or plaque. ? ?Left temple x1, right temple x2 (3) ?Erythematous thin papules/macules with gritty scale.  ? ? ?Assessment & Plan  ? ?Lentigines ?- Scattered tan macules ?- Due to sun exposure ?- Benign-appearing, observe ?- Recommend daily broad spectrum sunscreen SPF 30+ to sun-exposed areas, reapply every 2 hours as needed. ?- Call for any changes ? ?Seborrheic Keratoses ?- Stuck-on, waxy, tan-brown papules and/or plaques  ?- Benign-appearing ?- Discussed benign etiology and prognosis. ?- Observe ?- Call for any changes ? ?Melanocytic Nevi ?- Tan-brown and/or pink-flesh-colored symmetric macules and papules ?- Benign appearing on exam today ?- Observation ?- Call clinic for new or changing moles ?- Recommend daily use of broad spectrum spf 30+ sunscreen to sun-exposed areas.  ? ?Hemangiomas ?- Red papules ?- Discussed benign nature ?- Observe ?- Call for any changes ? ?Actinic Damage ?- Chronic condition, secondary to cumulative UV/sun exposure ?- diffuse scaly erythematous macules with underlying dyspigmentation ?- Recommend daily broad spectrum sunscreen SPF 30+ to sun-exposed areas, reapply every 2 hours as needed.  ?- Staying in the shade or wearing long sleeves, sun glasses (UVA+UVB  protection) and wide brim hats (4-inch brim around the entire circumference of the hat) are also recommended for sun protection.  ?- Call for new or changing lesions. ? ?Skin cancer screening performed today. ? ?History of Dysplastic Nevi ?- No evidence of recurrence today at right mid back and left epigastric ?- Recommend regular full body skin exams ?- Recommend daily broad spectrum sunscreen SPF 30+ to sun-exposed areas, reapply every 2 hours as needed.  ?- Call if any new or changing lesions are noted between office visits  ? ?Inflamed seborrheic keratosis ?right parasternal x1 ? ?Destruction of lesion - right parasternal x1 ?Complexity: simple   ?Destruction method: cryotherapy   ?Informed consent: discussed and consent obtained   ?Timeout:  patient name, date of birth, surgical site, and procedure verified ?Lesion destroyed using liquid nitrogen: Yes   ?Region frozen until ice ball extended beyond lesion: Yes   ?Outcome: patient tolerated procedure well with no complications   ?Post-procedure details: wound care instructions given   ? ?AK (actinic keratosis) (3) ?Left temple x1, right temple x2 ? ?Actinic keratoses are precancerous spots that appear secondary to cumulative UV radiation exposure/sun exposure over time. They are chronic with expected duration over 1 year. A portion of actinic keratoses will progress to squamous cell carcinoma of the skin. It is not possible to reliably predict which spots will progress to skin cancer and so treatment is recommended to prevent development of skin cancer. ? ?Recommend daily broad spectrum sunscreen SPF 30+ to sun-exposed areas, reapply every 2 hours as needed.  ?Recommend staying in the shade or wearing long sleeves, sun glasses (UVA+UVB protection) and  wide brim hats (4-inch brim around the entire circumference of the hat). ?Call for new or changing lesions. ? ?Destruction of lesion - Left temple x1, right temple x2 ?Complexity: simple   ?Destruction method:  cryotherapy   ?Informed consent: discussed and consent obtained   ?Timeout:  patient name, date of birth, surgical site, and procedure verified ?Lesion destroyed using liquid nitrogen: Yes   ?Region frozen until ice ball extended beyond lesion: Yes   ?Outcome: patient tolerated procedure well with no complications   ?Post-procedure details: wound care instructions given   ? ?Skin cancer screening ? ? ?Return in about 1 year (around 07/12/2022) for TBSE. ? ?I, Emelia Salisbury, CMA, am acting as scribe for Sarina Ser, MD. ?Documentation: I have reviewed the above documentation for accuracy and completeness, and I agree with the above. ? ?Sarina Ser, MD ? ? ?

## 2021-07-12 ENCOUNTER — Encounter: Payer: Self-pay | Admitting: Dermatology

## 2021-10-12 ENCOUNTER — Encounter: Payer: Self-pay | Admitting: Family Medicine

## 2021-10-12 DIAGNOSIS — M545 Low back pain, unspecified: Secondary | ICD-10-CM | POA: Diagnosis not present

## 2021-10-12 DIAGNOSIS — R0602 Shortness of breath: Secondary | ICD-10-CM | POA: Diagnosis not present

## 2021-10-12 DIAGNOSIS — E785 Hyperlipidemia, unspecified: Secondary | ICD-10-CM | POA: Diagnosis not present

## 2021-10-12 DIAGNOSIS — Z79899 Other long term (current) drug therapy: Secondary | ICD-10-CM | POA: Diagnosis not present

## 2021-10-12 DIAGNOSIS — R918 Other nonspecific abnormal finding of lung field: Secondary | ICD-10-CM | POA: Diagnosis not present

## 2021-10-12 DIAGNOSIS — R071 Chest pain on breathing: Secondary | ICD-10-CM | POA: Diagnosis not present

## 2021-10-12 DIAGNOSIS — R079 Chest pain, unspecified: Secondary | ICD-10-CM | POA: Diagnosis not present

## 2021-10-12 DIAGNOSIS — Z7982 Long term (current) use of aspirin: Secondary | ICD-10-CM | POA: Diagnosis not present

## 2021-10-12 DIAGNOSIS — R0781 Pleurodynia: Secondary | ICD-10-CM | POA: Diagnosis not present

## 2021-10-12 DIAGNOSIS — M109 Gout, unspecified: Secondary | ICD-10-CM | POA: Diagnosis not present

## 2021-10-12 DIAGNOSIS — I2510 Atherosclerotic heart disease of native coronary artery without angina pectoris: Secondary | ICD-10-CM | POA: Diagnosis not present

## 2021-10-12 DIAGNOSIS — Z86711 Personal history of pulmonary embolism: Secondary | ICD-10-CM | POA: Diagnosis not present

## 2021-10-12 DIAGNOSIS — I10 Essential (primary) hypertension: Secondary | ICD-10-CM | POA: Diagnosis not present

## 2021-10-12 DIAGNOSIS — J9811 Atelectasis: Secondary | ICD-10-CM | POA: Diagnosis not present

## 2021-10-12 DIAGNOSIS — R0902 Hypoxemia: Secondary | ICD-10-CM | POA: Diagnosis not present

## 2021-10-12 DIAGNOSIS — M549 Dorsalgia, unspecified: Secondary | ICD-10-CM | POA: Diagnosis not present

## 2021-10-13 ENCOUNTER — Other Ambulatory Visit: Payer: Self-pay | Admitting: Family Medicine

## 2021-10-13 DIAGNOSIS — G4700 Insomnia, unspecified: Secondary | ICD-10-CM

## 2021-10-13 DIAGNOSIS — I10 Essential (primary) hypertension: Secondary | ICD-10-CM

## 2021-10-13 DIAGNOSIS — R0781 Pleurodynia: Secondary | ICD-10-CM | POA: Diagnosis not present

## 2021-10-13 NOTE — Telephone Encounter (Signed)
Requested Prescriptions  Pending Prescriptions Disp Refills  . traZODone (DESYREL) 100 MG tablet [Pharmacy Med Name: TRAZODONE '100MG'$  TABLETS] 90 tablet 0    Sig: TAKE 1/2 TO 1 TABLET BY MOUTH AT BEDTIME AS NEEDED FOR SLEEP     Psychiatry: Antidepressants - Serotonin Modulator Passed - 10/13/2021 11:29 AM      Passed - Valid encounter within last 6 months    Recent Outpatient Visits          3 months ago Essential hypertension   [facility] Delsa Grana, PA-C   11 months ago Essential hypertension   [facility] Delsa Grana, PA-C   1 year ago Hyperlipidemia LDL goal <70   [facility] Delsa Grana, PA-C   1 year ago Essential hypertension   [facility] Delsa Grana, PA-C   1 year ago Hyperlipidemia LDL goal <70   [facility] Delsa Grana, PA-C      Future Appointments            In 1 week Delsa Grana, PA-C [facility], [place]   In 2 months Delsa Grana, PA-C [facility], [place]   In 5 months  [facility], [place]   In 9 months Ralene Bathe, MD [place]           . lisinopril (ZESTRIL) 20 MG tablet [Pharmacy Med Name: LISINOPRIL '20MG'$  TABLETS] 90 tablet 0    Sig: TAKE 1 TABLET(20 MG) BY MOUTH IN THE MORNING     Cardiovascular:  ACE Inhibitors Passed - 10/13/2021 11:29 AM      Passed - Cr in normal range and within 180 days    Creat  Date Value Ref Range Status  06/27/2021 1.15 0.70 - 1.28 mg/dL Final         Passed - K in normal range and within 180 days    Potassium  Date Value Ref Range Status  06/27/2021 4.5 3.5 - 5.3 mmol/L Final         Passed - Patient is not pregnant      Passed - Last BP in normal range    BP Readings from Last 1 Encounters:  06/27/21 134/72         Passed - Valid encounter within last 6 months    Recent Outpatient Visits          3 months ago Essential hypertension   [facility] Delsa Grana, PA-C   11 months ago Essential hypertension   [facility] Delsa Grana, PA-C   1 year ago Hyperlipidemia LDL goal <70   [facility] Delsa Grana, PA-C   1 year ago Essential hypertension   [facility] Delsa Grana, PA-C   1 year ago Hyperlipidemia LDL goal <70   [facility] Delsa Grana, PA-C      Future Appointments            In 1 week Delsa Grana, PA-C [facility], [place]   In 2 months Delsa Grana, PA-C [facility], [place]   In 5 months  [facility], [place]   In 9 months Ralene Bathe, MD [place]

## 2021-10-14 DIAGNOSIS — R0781 Pleurodynia: Secondary | ICD-10-CM | POA: Diagnosis not present

## 2021-10-19 ENCOUNTER — Other Ambulatory Visit: Payer: Self-pay | Admitting: Family Medicine

## 2021-10-19 DIAGNOSIS — I10 Essential (primary) hypertension: Secondary | ICD-10-CM

## 2021-10-24 ENCOUNTER — Ambulatory Visit: Payer: Medicare PPO | Admitting: Family Medicine

## 2021-10-24 ENCOUNTER — Encounter: Payer: Self-pay | Admitting: Family Medicine

## 2021-10-24 VITALS — BP 126/80 | HR 95 | Temp 98.0°F | Resp 16 | Ht 69.0 in | Wt 184.1 lb

## 2021-10-24 DIAGNOSIS — Z86711 Personal history of pulmonary embolism: Secondary | ICD-10-CM | POA: Diagnosis not present

## 2021-10-24 DIAGNOSIS — I10 Essential (primary) hypertension: Secondary | ICD-10-CM | POA: Diagnosis not present

## 2021-10-24 DIAGNOSIS — Z09 Encounter for follow-up examination after completed treatment for conditions other than malignant neoplasm: Secondary | ICD-10-CM

## 2021-10-24 DIAGNOSIS — I2510 Atherosclerotic heart disease of native coronary artery without angina pectoris: Secondary | ICD-10-CM | POA: Diagnosis not present

## 2021-10-24 DIAGNOSIS — E785 Hyperlipidemia, unspecified: Secondary | ICD-10-CM | POA: Diagnosis not present

## 2021-10-24 DIAGNOSIS — R0609 Other forms of dyspnea: Secondary | ICD-10-CM | POA: Diagnosis not present

## 2021-10-24 DIAGNOSIS — R0989 Other specified symptoms and signs involving the circulatory and respiratory systems: Secondary | ICD-10-CM

## 2021-10-24 DIAGNOSIS — Z7189 Other specified counseling: Secondary | ICD-10-CM

## 2021-10-24 DIAGNOSIS — R079 Chest pain, unspecified: Secondary | ICD-10-CM | POA: Diagnosis not present

## 2021-10-24 MED ORDER — LISINOPRIL 20 MG PO TABS: ORAL_TABLET | ORAL | 3 refills | Status: DC

## 2021-10-24 MED ORDER — EZETIMIBE 10 MG PO TABS: 10.0000 mg | ORAL_TABLET | Freq: Every day | ORAL | 3 refills | Status: DC

## 2021-10-24 MED ORDER — APIXABAN 5 MG PO TABS: 5.0000 mg | ORAL_TABLET | Freq: Two times a day (BID) | ORAL | 1 refills | Status: DC

## 2021-10-24 NOTE — Progress Notes (Unsigned)
Patient ID: Victor Knight, male    DOB: 02-Aug-1950, 71 y.o.   MRN: [id number]  PCP: Victor Grana, PA-C  Chief Complaint  Patient presents with   Hospitalization Follow-up    Pt states overall better but still struggle to take deep breaths    Subjective:   Victor Knight is a 71 y.o. male, presents to clinic with CC of the following:  HPI  PT presetned to the ED at [facility] for pleuritic CP CTA did not show definitive PE due to artifact, but with pt hx he was put on eliquis and told to f/up with primary care  ER records and results thoroughly reviewed:  October 12, 2021 6:20 PM  Victor Knight is a 71 y.o. male with past medical history of PE (last 04/28/2019 - currently on ASA), CAD, HTN, and HLD presenting with shortness of breath. The patient endorses 3 days of shortness of breath 2/2 pain with associated lower back pain that radiates to his upper back since yesterday. He reports that his current symptoms are similar to his previous PE 3.5 years ago. He reports he was previously on Eliquis for 6 months but was subsequently stopped on the medication but was told that if he had another PE, he would be restarted on Eliquis. The patient reports no recent long periods of immobility. The patient denies chest pain, numbness, tingling, fever, cough, congestion, leg edema, dysuria, or hematuria.   BP 158/87  Pulse 79  Temp 36.9 C (98.4 F)  Resp 19  Wt 88.5 kg (195 lb)  SpO2 92%   EXAM: XR CHEST 2 VIEWS DATE: 10/12/2021 3:47 PM ACCESSION: [id number] UN DICTATED: 10/12/2021 3:56 PM INTERPRETATION LOCATION: [place]  CLINICAL INDICATION: 71 year old male with chest pain.   TECHNIQUE: PA and Lateral Chest Radiographs.  COMPARISON: None.  FINDINGS:   Left streaky opacities likely atelectasis. Possible trace bilateral effusions. No pneumothorax  Stable cardiomediastinal silhouette.   IMPRESSION: Left basilar atelectasis. Possible trace bilateral pleural  effusions.  EXAM: CTA Chest for Pulmonary Embolus DATE: 10/12/2021 7:24 PM ACCESSION: [id number] UN DICTATED: 10/12/2021 8:04 PM INTERPRETATION LOCATION: [place]  CLINICAL INDICATION: 71 years old Male with SOB, pain with deep inspiration, hx PE. Eval for PE   COMPARISON: Earlier same day chest radiographs  TECHNIQUE: Contiguous 1 and 2 mm axial images were reconstructed through the chest following a single breath hold helical acquisition. Images were reformatted in the sagittal and coronal planes. Multiplanar MIP slabs were created for more thorough evaluation of the pulmonary vasculature. Intravenous contrast was administered.  The examination was performed with limited z-axis coverage and reduced kVp to improve vascular opacification and reduce breath hold and contrast and radiation dose.  kVP: 100 DLP: 98.9   FINDINGS:   PULMONARY ARTERIES: Dilation of the lower lobes is limited by motion artifact. No evidence of acute pulmonary embolus is identified  LUNGS AND AIRWAYS: Subsegmental bibasilar atelectasis and atelectasis of the right posterior upper lobe. Calcified granuloma in the right lower lobe. 0.4 cm right perifissural node. The central airways are patent.  PLEURA: No pleural fluid or pneumothorax.,  MEDIASTINUM AND LYMPH NODES: No enlarged intrathoracic or axillary lymph nodes are present. No other mediastinal abnormalities are present.  HEART AND VASCULATURE: Cardiac chambers normal in size. Ascending and descending aorta normal in caliber. There is no significant pericardial effusion. Moderate coronary artery calcifications.  BONES AND SOFT TISSUES: Degenerative changes spine  UPPER ABDOMEN:Unremarkable  IMPRESSION:  Evaluation limited by motion artifact. Within  this constraint, no evidence of acute pulmonary embolus.  Patient Active Problem List   Diagnosis Date Noted   Primary open angle glaucoma (POAG) of both eyes 06/27/2021   Aortic atherosclerosis ([place])  04/29/2020   History of pulmonary embolus (PE) 04/29/2020   Coronary artery disease involving native heart without angina pectoris 04/29/2020   Abdominal aortic atherosclerosis ([place]) 10/27/2015   Aortic ectasia, abdominal ([place]) 10/27/2015   Hx of smoking 10/05/2015   History of hepatitis C 10/05/2015   Essential hypertension 10/05/2014   Insomnia 10/05/2014   Hyperlipidemia LDL goal <70 10/05/2014   Gout 10/05/2014     Current Outpatient Medications:    allopurinol (ZYLOPRIM) 100 MG tablet, TAKE 1 TABLET(100 MG) BY MOUTH DAILY, Disp: 90 tablet, Rfl: 3   ELIQUIS DVT/PE STARTER PACK, Take 1 tablet by mouth as directed., Disp: , Rfl:    ezetimibe (ZETIA) 10 MG tablet, Take 1 tablet (10 mg total) by mouth daily., Disp: 90 tablet, Rfl: 3   latanoprost (XALATAN) 0.005 % ophthalmic solution, Place 1 drop into the right eye daily., Disp: , Rfl:    lisinopril (ZESTRIL) 20 MG tablet, TAKE 1 TABLET(20 MG) BY MOUTH IN THE MORNING, Disp: 90 tablet, Rfl: 0   Multiple Vitamin (MULTIVITAMIN) tablet, Take 1 tablet by mouth daily., Disp: , Rfl:    rosuvastatin (CRESTOR) 40 MG tablet, TAKE 1 TABLET(40 MG) BY MOUTH DAILY, Disp: 90 tablet, Rfl: 3   traZODone (DESYREL) 100 MG tablet, TAKE 1/2 TO 1 TABLET BY MOUTH AT BEDTIME AS NEEDED FOR SLEEP, Disp: 90 tablet, Rfl: 0   aspirin EC 81 MG tablet, Take 81 mg by mouth daily. Swallow whole. (Patient not taking: Reported on 10/24/2021), Disp: , Rfl:    Allergies  Allergen Reactions   Keflex [Cephalexin] Rash     Social History   Tobacco Use   Smoking status: Former   Smokeless tobacco: Never  Vaping Use   Vaping Use: Never used  Substance Use Topics   Alcohol use: Yes    Alcohol/week: 7.0 standard drinks of alcohol    Types: 7 Glasses of wine per week   Drug use: No      Chart Review Today: I personally reviewed active problem list, medication list, allergies, family history, social history, health maintenance, notes from last encounter, lab  results, imaging with the patient/caregiver today. Extensive review of chart, recent and prior ED visits, results, hematology visits and recommendations - more than 15 min of chart review and documentation for visit today  Review of Systems     Objective:   Vitals:   10/24/21 1348  BP: 126/80  Pulse: 95  Resp: 16  Temp: 98 F (36.7 C)  TempSrc: Oral  SpO2: 95%  Weight: 184 lb 1.6 oz (83.5 kg)  Height: _0  (1.753 m)    Body mass index is 27.19 kg/m.  Physical Exam   Results for orders placed or performed in visit on 06/27/21  COMPLETE METABOLIC PANEL WITH GFR  Result Value Ref Range   Glucose, Bld 100 (H) 65 - 99 mg/dL   BUN 18 7 - 25 mg/dL   Creat 1.15 0.70 - 1.28 mg/dL   eGFR 68 > OR = 60 mL/min/1.81m   BUN/Creatinine Ratio NOT APPLICABLE 6 - 22 (calc)   Sodium 140 135 - 146 mmol/L   Potassium 4.5 3.5 - 5.3 mmol/L   Chloride 105 98 - 110 mmol/L   CO2 27 20 - 32 mmol/L   Calcium 9.2 8.6 - 10.3 mg/dL  Total Protein 6.8 6.1 - 8.1 g/dL   Albumin 4.5 3.6 - 5.1 g/dL   Globulin 2.3 1.9 - 3.7 g/dL (calc)   AG Ratio 2.0 1.0 - 2.5 (calc)   Total Bilirubin 0.6 0.2 - 1.2 mg/dL   Alkaline phosphatase (APISO) 40 35 - 144 U/L   AST 41 (H) 10 - 35 U/L   ALT 38 9 - 46 U/L  Lipid panel  Result Value Ref Range   Cholesterol 148 <200 mg/dL   HDL 60 > OR = 40 mg/dL   Triglycerides 104 <150 mg/dL   LDL Cholesterol (Calc) 69 mg/dL (calc)   Total CHOL/HDL Ratio 2.5 <5.0 (calc)   Non-HDL Cholesterol (Calc) 88 <130 mg/dL (calc)  CBC with Differential/Platelet  Result Value Ref Range   WBC 6.0 3.8 - 10.8 Thousand/uL   RBC 5.12 4.20 - 5.80 Million/uL   Hemoglobin 15.6 13.2 - 17.1 g/dL   HCT 46.9 38.5 - 50.0 %   MCV 91.6 80.0 - 100.0 fL   MCH 30.5 27.0 - 33.0 pg   MCHC 33.3 32.0 - 36.0 g/dL   RDW 12.4 11.0 - 15.0 %   Platelets 173 140 - 400 Thousand/uL   MPV 9.8 7.5 - 12.5 fL   Neutro Abs 3,348 1,500 - 7,800 cells/uL   Lymphs Abs 1,926 850 - 3,900 cells/uL   Absolute  Monocytes 582 200 - 950 cells/uL   Eosinophils Absolute 102 15 - 500 cells/uL   Basophils Absolute 42 0 - 200 cells/uL   Neutrophils Relative % 55.8 %   Total Lymphocyte 32.1 %   Monocytes Relative 9.7 %   Eosinophils Relative 1.7 %   Basophils Relative 0.7 %       Assessment & Plan:         Victor Grana, PA-C 10/24/21 2:07 PM

## 2021-10-26 ENCOUNTER — Telehealth: Payer: Self-pay | Admitting: Family Medicine

## 2021-10-26 NOTE — Telephone Encounter (Unsigned)
Copied from [place] [id number]. Topic: Referral - Question >> Oct 26, 2021  9:54 AM Victor Knight wrote: Reason for CRM: The patient has reached out regarding their oncology/hematology referral to Dr Grayland Ormond  The patient has been contacted by [facility] and was told that there was no Dr Grayland Ormond practicing   The patient would like to speak with a member of clinical staff about their previously requested referral when possible  Please contact further

## 2021-10-26 NOTE — Telephone Encounter (Signed)
11/08/2021 9:45 AM Finnegan, Kathlene November, MD [facility]  Pt has this appt scheduled - please let him know its all sorted - and Dr. Wanda Plump has messaged me about his upcoming appt as well

## 2021-10-27 NOTE — Telephone Encounter (Signed)
Called pt no answer left detailed vm to call back office if have questions about his upcoming appointment with Dr.F.

## 2021-11-07 NOTE — Progress Notes (Unsigned)
[place]  Telephone:(336) [phone] Fax:(336) [phone]  ID: SEVERUS BRODZINSKI OB: Dec 12, 1950  MR#: [id number]  QZE#:[id number]  Patient Care Team: Delsa Grana, PA-C as PCP - General (Family Medicine) Ralene Bathe, MD (Dermatology) Estill Cotta, MD (Ophthalmology) Germaine Pomfret, [facility] as Pharmacist (Pharmacist)   CHIEF COMPLAINT: Bilateral pulmonary embolism.  INTERVAL HISTORY: Patient last evaluated in clinic in March 2021.  Patient agreed to video enabled telemedicine visit for further evaluation and discussion of his laboratory work.  He currently feels well and is asymptomatic.  He is tolerating Eliquis without significant side effects.   He has no neurologic complaints.  He denies any recent fevers or illnesses.  He has a good appetite and denies weight loss.  He denies any chest pain, shortness of breath, cough, or hemoptysis.  He has no nausea, vomiting, constipation, or diarrhea.  He has no urinary complaints.  Patient feels at his baseline offers no specific complaints today.  REVIEW OF SYSTEMS:   Review of Systems  Constitutional: Negative.  Negative for fever, malaise/fatigue and weight loss.  Respiratory: Negative.  Negative for cough, hemoptysis and shortness of breath.   Cardiovascular: Negative.  Negative for chest pain and leg swelling.  Gastrointestinal: Negative.  Negative for abdominal pain.  Genitourinary: Negative.  Negative for dysuria.  Musculoskeletal: Negative.  Negative for back pain.  Skin: Negative.  Negative for rash.  Neurological: Negative.  Negative for dizziness, focal weakness, weakness and headaches.  Endo/Heme/Allergies:  Does not bruise/bleed easily.  Psychiatric/Behavioral: Negative.  The patient is not nervous/anxious.     As per HPI. Otherwise, a complete review of systems is negative.  PAST MEDICAL HISTORY: Past Medical History:  Diagnosis Date   Abdominal aortic atherosclerosis ([place]) 10/27/2015    Adhesive capsulitis of shoulder 04/09/2017   Aortic ectasia, abdominal ([place]) 10/27/2015   Bilateral pulmonary embolism ([place]) 04/28/2019   Bursitis of shoulder 04/09/2017   Complete tear of right rotator cuff 04/20/2016   Dupuytren contracture 04/09/2017   Dysplastic nevus 07/13/2014   L epigastric - moder to severe, excision 08/25/2014   Dysplastic nevus 11/03/2015   R mid back 5.0 cm lat to spine - moderate   Gout    Hyperlipidemia    Hypertension    Insomnia    Plantar fasciitis of left foot 03/31/2019   Tendinitis of shoulder 04/09/2017    PAST SURGICAL HISTORY: Past Surgical History:  Procedure Laterality Date   CARPAL TUNNEL RELEASE     rotator cuff surgery   04/20/2016   right arm; [facility]    TRIGGER FINGER RELEASE      FAMILY HISTORY: Family History  Problem Relation Age of Onset   Hypertension Father    Heart disease Father    Hypertension Brother    Hypertension Sister    Supraventricular tachycardia Daughter    Heart disease Daughter    Hypertension Brother    Hypertension Brother    Hypertension Brother    Heart disease Brother     ADVANCED DIRECTIVES (Y/N):  N  HEALTH MAINTENANCE: Social History   Tobacco Use   Smoking status: Former   Smokeless tobacco: Never  Vaping Use   Vaping Use: Never used  Substance Use Topics   Alcohol use: Yes    Alcohol/week: 7.0 standard drinks of alcohol    Types: 7 Glasses of wine per week   Drug use: No     Colonoscopy:  PAP:  Bone density:  Lipid panel:  Allergies  Allergen Reactions  Keflex [Cephalexin] Rash    Current Outpatient Medications  Medication Sig Dispense Refill   allopurinol (ZYLOPRIM) 100 MG tablet TAKE 1 TABLET(100 MG) BY MOUTH DAILY 90 tablet 3   apixaban (ELIQUIS) 5 MG TABS tablet Take 1 tablet (5 mg total) by mouth 2 (two) times daily. 180 tablet 1   aspirin EC 81 MG tablet Take 81 mg by mouth daily. Swallow whole. (Patient not taking: Reported on 10/24/2021)     ELIQUIS DVT/PE STARTER  PACK Take 1 tablet by mouth as directed.     ezetimibe (ZETIA) 10 MG tablet Take 1 tablet (10 mg total) by mouth daily. 90 tablet 3   latanoprost (XALATAN) 0.005 % ophthalmic solution Place 1 drop into the right eye daily.     lisinopril (ZESTRIL) 20 MG tablet TAKE 1 TABLET(20 MG) BY MOUTH IN THE MORNING 90 tablet 3   Multiple Vitamin (MULTIVITAMIN) tablet Take 1 tablet by mouth daily.     rosuvastatin (CRESTOR) 40 MG tablet TAKE 1 TABLET(40 MG) BY MOUTH DAILY 90 tablet 3   traZODone (DESYREL) 100 MG tablet TAKE 1/2 TO 1 TABLET BY MOUTH AT BEDTIME AS NEEDED FOR SLEEP 90 tablet 0   No current facility-administered medications for this visit.    OBJECTIVE: There were no vitals filed for this visit.   There is no height or weight on file to calculate BMI.    ECOG FS:0 - Asymptomatic  General: Well-developed, well-nourished, no acute distress. HEENT: Normocephalic. Neuro: Alert, answering all questions appropriately. Cranial nerves grossly intact. Psych: Normal affect.   LAB RESULTS:  Lab Results  Component Value Date   NA 140 06/27/2021   K 4.5 06/27/2021   CL 105 06/27/2021   CO2 27 06/27/2021   GLUCOSE 100 (H) 06/27/2021   BUN 18 06/27/2021   CREATININE 1.15 06/27/2021   CALCIUM 9.2 06/27/2021   PROT 6.8 06/27/2021   ALBUMIN 3.9 04/28/2019   AST 41 (H) 06/27/2021   ALT 38 06/27/2021   ALKPHOS 39 04/28/2019   BILITOT 0.6 06/27/2021   GFRNONAA 76 04/29/2020   GFRAA 88 04/29/2020    Lab Results  Component Value Date   WBC 6.0 06/27/2021   NEUTROABS 3,348 06/27/2021   HGB 15.6 06/27/2021   HCT 46.9 06/27/2021   MCV 91.6 06/27/2021   PLT 173 06/27/2021     STUDIES: No results found.  ASSESSMENT: Bilateral pulmonary embolism.  PLAN:    1.  Bilateral pulmonary embolism: Patient has no personal or family history of blood clots.  He had no apparent transient risk factors such as recent surgery or travel.  He does not lead a sedentary lifestyle and remains active.   Full hypercoagulable work-up was either negative or within normal limits.  Patient has no obvious discernible reason for his pulmonary embolism.  Patient will require a minimum of 6 months of anticoagulation completing treatment in July 2021.  He was given a refill of his Eliquis today.  We had a lengthy discussion regarding risk factors for DVT and blood clot.  Patient expressed understanding that although his hypercoagulable work-up was negative, he has had slightly higher increase for a second blood clot than the general population and that he should take appropriate measures when needed.  He also has been instructed that if he ever had a second blood clot for any reason in the future, would recommend lifelong anticoagulation at that point.  No further follow-up is necessary.  Please refer patient back if there are any questions or concerns.  Patient expressed understanding and was in agreement with this plan. He also understands that He can call clinic at any time with any questions, concerns, or complaints.     Lloyd Huger, MD   11/07/2021 2:44 PM

## 2021-11-08 ENCOUNTER — Inpatient Hospital Stay: Payer: Medicare PPO

## 2021-11-08 ENCOUNTER — Encounter: Payer: Self-pay | Admitting: Oncology

## 2021-11-08 ENCOUNTER — Inpatient Hospital Stay: Payer: Medicare PPO | Attending: Oncology | Admitting: Oncology

## 2021-11-08 VITALS — BP 142/90 | HR 71 | Temp 97.1°F | Resp 16 | Ht 69.0 in | Wt 188.5 lb

## 2021-11-08 DIAGNOSIS — Z7901 Long term (current) use of anticoagulants: Secondary | ICD-10-CM | POA: Diagnosis not present

## 2021-11-08 DIAGNOSIS — I2699 Other pulmonary embolism without acute cor pulmonale: Secondary | ICD-10-CM | POA: Diagnosis not present

## 2021-11-08 DIAGNOSIS — Z86711 Personal history of pulmonary embolism: Secondary | ICD-10-CM

## 2021-11-08 DIAGNOSIS — R911 Solitary pulmonary nodule: Secondary | ICD-10-CM

## 2021-11-15 ENCOUNTER — Ambulatory Visit
Admission: RE | Admit: 2021-11-15 | Discharge: 2021-11-15 | Disposition: A | Discharge status: Home / Self Care | Payer: Medicare PPO | Source: Ambulatory Visit | Attending: Oncology | Admitting: Oncology

## 2021-11-15 DIAGNOSIS — R918 Other nonspecific abnormal finding of lung field: Secondary | ICD-10-CM | POA: Diagnosis not present

## 2021-11-15 DIAGNOSIS — I2699 Other pulmonary embolism without acute cor pulmonale: Secondary | ICD-10-CM | POA: Diagnosis not present

## 2021-11-15 DIAGNOSIS — Z86711 Personal history of pulmonary embolism: Secondary | ICD-10-CM | POA: Insufficient documentation

## 2021-11-15 DIAGNOSIS — H401131 Primary open-angle glaucoma, bilateral, mild stage: Secondary | ICD-10-CM | POA: Diagnosis not present

## 2021-11-15 DIAGNOSIS — I2510 Atherosclerotic heart disease of native coronary artery without angina pectoris: Secondary | ICD-10-CM | POA: Diagnosis not present

## 2021-11-15 DIAGNOSIS — I700 Atherosclerosis of aorta: Secondary | ICD-10-CM | POA: Diagnosis not present

## 2021-11-15 MED ORDER — IOHEXOL 350 MG/ML SOLN
75.0000 mL | Freq: Once | INTRAVENOUS | Status: AC | PRN
  Administered 2021-11-15: 75 mL via INTRAVENOUS

## 2021-11-17 NOTE — Progress Notes (Signed)
[place]  Telephone:(336) [phone] Fax:(336) [phone]  ID: TODD JELINSKI OB: [birth year]  MR#: [id number]  AOZ#:[id number]  Patient Care Team: Delsa Grana, PA-C as PCP - General (Family Medicine) Ralene Bathe, MD (Dermatology) Estill Cotta, MD (Ophthalmology) Germaine Pomfret, [facility] as Pharmacist (Pharmacist)  I connected with [name] on 11/23/21 at  3:30 PM EDT by video enabled telemedicine visit and verified that I am speaking with the correct person using two identifiers.   I discussed the limitations, risks, security and privacy concerns of performing an evaluation and management service by telemedicine and the availability of in-person appointments. I also discussed with the patient that there may be a patient responsible charge related to this service. The patient expressed understanding and agreed to proceed.   Other persons participating in the visit and their role in the encounter: Patient, MD.  Patient's location: Home. Provider's location: Clinic.  CHIEF COMPLAINT: History of bilateral pulmonary embolism.  INTERVAL HISTORY: Patient agreed to video assisted telemedicine visit for further evaluation and discussion of his recent CT results.  He currently feels well and is asymptomatic.  He has no neurologic complaints.  He denies any recent fevers or illnesses.  He has a good appetite and denies weight loss.  He denies any chest pain, shortness of breath, cough, or hemoptysis.  He has no nausea, vomiting, constipation, or diarrhea.  He has no urinary complaints.  Patient offers no specific complaints today.  REVIEW OF SYSTEMS:   Review of Systems  Constitutional: Negative.  Negative for fever, malaise/fatigue and weight loss.  Respiratory: Negative.  Negative for cough, hemoptysis and shortness of breath.   Cardiovascular: Negative.  Negative for chest pain and leg swelling.  Gastrointestinal: Negative.  Negative for abdominal pain.   Genitourinary: Negative.  Negative for dysuria.  Musculoskeletal: Negative.  Negative for back pain.  Skin: Negative.  Negative for rash.  Neurological: Negative.  Negative for dizziness, focal weakness, weakness and headaches.  Endo/Heme/Allergies:  Does not bruise/bleed easily.  Psychiatric/Behavioral: Negative.  The patient is not nervous/anxious.     As per HPI. Otherwise, a complete review of systems is negative.  PAST MEDICAL HISTORY: Past Medical History:  Diagnosis Date   Abdominal aortic atherosclerosis ([place]) 10/27/2015   Adhesive capsulitis of shoulder 04/09/2017   Aortic ectasia, abdominal ([place]) 10/27/2015   Bilateral pulmonary embolism ([place]) 04/28/2019   Bursitis of shoulder 04/09/2017   Complete tear of right rotator cuff 04/20/2016   Dupuytren contracture 04/09/2017   Dysplastic nevus 07/13/2014   L epigastric - moder to severe, excision 08/25/2014   Dysplastic nevus 11/03/2015   R mid back 5.0 cm lat to spine - moderate   Gout    Hyperlipidemia    Hypertension    Insomnia    Plantar fasciitis of left foot 03/31/2019   Tendinitis of shoulder 04/09/2017    PAST SURGICAL HISTORY: Past Surgical History:  Procedure Laterality Date   CARPAL TUNNEL RELEASE     rotator cuff surgery   04/20/2016   right arm; [facility]    TRIGGER FINGER RELEASE      FAMILY HISTORY: Family History  Problem Relation Age of Onset   Hypertension Father    Heart disease Father    Hypertension Brother    Hypertension Sister    Supraventricular tachycardia Daughter    Heart disease Daughter    Hypertension Brother    Hypertension Brother    Hypertension Brother    Heart disease Brother  ADVANCED DIRECTIVES (Y/N):  N  HEALTH MAINTENANCE: Social History   Tobacco Use   Smoking status: Former   Smokeless tobacco: Never  Vaping Use   Vaping Use: Never used  Substance Use Topics   Alcohol use: Yes    Alcohol/week: 7.0 standard drinks of alcohol    Types: 7 Glasses of wine  per week   Drug use: No     Colonoscopy:  PAP:  Bone density:  Lipid panel:  Allergies  Allergen Reactions   Keflex [Cephalexin] Rash    Current Outpatient Medications  Medication Sig Dispense Refill   allopurinol (ZYLOPRIM) 100 MG tablet TAKE 1 TABLET(100 MG) BY MOUTH DAILY 90 tablet 3   apixaban (ELIQUIS) 5 MG TABS tablet Take 1 tablet (5 mg total) by mouth 2 (two) times daily. 180 tablet 1   atorvastatin (LIPITOR) 40 MG tablet      bimatoprost (LUMIGAN) 0.01 % SOLN      diclofenac (VOLTAREN) 75 MG EC tablet      ezetimibe (ZETIA) 10 MG tablet Take 1 tablet (10 mg total) by mouth daily. 90 tablet 3   latanoprost (XALATAN) 0.005 % ophthalmic solution Place 1 drop into the right eye daily.     lisinopril (ZESTRIL) 20 MG tablet TAKE 1 TABLET(20 MG) BY MOUTH IN THE MORNING 90 tablet 3   Multiple Vitamin (MULTIVITAMIN) tablet Take 1 tablet by mouth daily.     rosuvastatin (CRESTOR) 40 MG tablet TAKE 1 TABLET(40 MG) BY MOUTH DAILY 90 tablet 3   traZODone (DESYREL) 100 MG tablet TAKE 1/2 TO 1 TABLET BY MOUTH AT BEDTIME AS NEEDED FOR SLEEP 90 tablet 0   zolpidem (AMBIEN) 10 MG tablet      No current facility-administered medications for this visit.    OBJECTIVE: There were no vitals filed for this visit.    There is no height or weight on file to calculate BMI.    ECOG FS:0 - Asymptomatic  General: Well-developed, well-nourished, no acute distress. HEENT: Normocephalic. Neuro: Alert, answering all questions appropriately. Cranial nerves grossly intact. Psych: Normal affect.  LAB RESULTS:  Lab Results  Component Value Date   NA 140 06/27/2021   K 4.5 06/27/2021   CL 105 06/27/2021   CO2 27 06/27/2021   GLUCOSE 100 (H) 06/27/2021   BUN 18 06/27/2021   CREATININE 1.15 06/27/2021   CALCIUM 9.2 06/27/2021   PROT 6.8 06/27/2021   ALBUMIN 3.9 04/28/2019   AST 41 (H) 06/27/2021   ALT 38 06/27/2021   ALKPHOS 39 04/28/2019   BILITOT 0.6 06/27/2021   GFRNONAA 76 04/29/2020    GFRAA 88 04/29/2020    Lab Results  Component Value Date   WBC 6.0 06/27/2021   NEUTROABS 3,348 06/27/2021   HGB 15.6 06/27/2021   HCT 46.9 06/27/2021   MCV 91.6 06/27/2021   PLT 173 06/27/2021     STUDIES: CT Angio Chest Pulmonary Embolism (PE) W or WO Contrast  Result Date: 11/15/2021 CLINICAL DATA:  History of Pulmonary Embolism; PE diagnosis June, follow-up EXAM: CT ANGIOGRAPHY CHEST WITH CONTRAST TECHNIQUE: Multidetector CT imaging of the chest was performed using the standard protocol during bolus administration of intravenous contrast. Multiplanar CT image reconstructions and MIPs were obtained to evaluate the vascular anatomy. RADIATION DOSE REDUCTION: This exam was performed according to the departmental dose-optimization program which includes automated exposure control, adjustment of the mA and/or kV according to patient size and/or use of iterative reconstruction technique. CONTRAST:  37m OMNIPAQUE IOHEXOL 350 MG/ML SOLN COMPARISON:  CT chest January 2022, prior CTA is unavailable. FINDINGS: Cardiovascular: Satisfactory opacification of the pulmonary arteries to the segmental level. No evidence of pulmonary embolism. Normal heart size. No pericardial effusion. Thoracic aorta and coronary artery calcifications. Mediastinum/Nodes: No enlarged nodes.  Normal thyroid. Lungs/Pleura: No consolidation or mass.  No pleural effusion. Upper Abdomen: No acute abnormality. Musculoskeletal: No acute osseous abnormality. Review of the MIP images confirms the above findings. IMPRESSION: No evidence of pulmonary emboli. Electronically Signed   By: Macy Mis M.D.   On: 11/15/2021 14:18    ASSESSMENT: History of bilateral pulmonary embolism.  PLAN:    1.  History of bilateral pulmonary embolism: Patient has no personal or family history of blood clots.  He had no apparent transient risk factors such as recent surgery or travel.  He does not lead a sedentary lifestyle and remains active.   Full hypercoagulable work-up was either negative or within normal limits.  Patient has no obvious discernible reason for his pulmonary embolism.  At that time, patient completed 6 months of Eliquis in July 2021.   2.  Possible recurrent PE: Although patient's symptoms consistent with his first pulmonary embolism, no PE was noted on PE protocol CT.  Repeat PE protocol of the chest once again did not reveal any evidence of pulmonary embolism.  After lengthy discussion with the patient, he expressed desire to stay on treatment doses of Eliquis at 5 mg twice per day for 3 months and then reduce his dose to prophylactic dosing at 2.5 mg twice per day indefinitely.  We agreed that no further follow-up is necessary and he can obtain refills from his primary care physician.  Please refer patient back if there are any questions or concerns.    I provided 20 minutes of face-to-face video visit time during this encounter which included chart review, counseling, and coordination of care as documented above.   Patient expressed understanding and was in agreement with this plan. He also understands that He can call clinic at any time with any questions, concerns, or complaints.     Lloyd Huger, MD   11/23/2021 7:43 AM

## 2021-11-22 ENCOUNTER — Inpatient Hospital Stay: Payer: Medicare PPO | Attending: Oncology | Admitting: Oncology

## 2021-11-22 DIAGNOSIS — Z86711 Personal history of pulmonary embolism: Secondary | ICD-10-CM

## 2021-12-09 ENCOUNTER — Ambulatory Visit: Payer: Medicare PPO | Admitting: Cardiology

## 2021-12-09 ENCOUNTER — Encounter: Payer: Self-pay | Admitting: Cardiology

## 2021-12-09 VITALS — BP 138/88 | HR 56 | Ht 69.0 in | Wt 190.1 lb

## 2021-12-09 DIAGNOSIS — I2510 Atherosclerotic heart disease of native coronary artery without angina pectoris: Secondary | ICD-10-CM

## 2021-12-09 DIAGNOSIS — I10 Essential (primary) hypertension: Secondary | ICD-10-CM

## 2021-12-09 NOTE — Progress Notes (Signed)
Cardiology Office Note:    Date:  12/09/2021   ID:  Victor Knight, DOB 02/03/1951, MRN [id number]  PCP:  Victor Knight, [place] [facility] Cardiologist:  Victor Sable, MD     Referring MD: Victor Grana, PA-C   Chief Complaint  Patient presents with   Other    CAD/HTN/HLD no complaints today. Meds reviewed verbally with pt.    History of Present Illness:    Victor Knight is a 71 y.o. male with a hx of CAD (LAD and RCA calcifications on chest CT ), hypertension, hyperlipidemia, PE on Eliquis presenting due to CAD.  Patient denies chest pain or shortness of breath.  Takes Eliquis for PE.  Blood pressures are adequately controlled at home.  CT chest 11/2021 showed LAD, RCA calcifications.  Compliant with Eliquis, Crestor and Zetia as prescribed.  Feels well, has no concerns at this time.  Past Medical History:  Diagnosis Date   Abdominal aortic atherosclerosis ([place]) 10/27/2015   Adhesive capsulitis of shoulder 04/09/2017   Aortic ectasia, abdominal ([place]) 10/27/2015   Bilateral pulmonary embolism ([place]) 04/28/2019   Bursitis of shoulder 04/09/2017   Complete tear of right rotator cuff 04/20/2016   Dupuytren contracture 04/09/2017   Dysplastic nevus 07/13/2014   L epigastric - moder to severe, excision 08/25/2014   Dysplastic nevus 11/03/2015   R mid back 5.0 cm lat to spine - moderate   Gout    Hyperlipidemia    Hypertension    Insomnia    Plantar fasciitis of left foot 03/31/2019   Tendinitis of shoulder 04/09/2017    Past Surgical History:  Procedure Laterality Date   CARPAL TUNNEL RELEASE     rotator cuff surgery   04/20/2016   right arm; [facility]    TRIGGER FINGER RELEASE      Current Medications: Current Meds  Medication Sig   allopurinol (ZYLOPRIM) 100 MG tablet TAKE 1 TABLET(100 MG) BY MOUTH DAILY   apixaban (ELIQUIS) 5 MG TABS tablet Take 1 tablet (5 mg total) by mouth 2 (two) times daily.   atorvastatin (LIPITOR) 40 MG tablet     ezetimibe (ZETIA) 10 MG tablet Take 1 tablet (10 mg total) by mouth daily.   latanoprost (XALATAN) 0.005 % ophthalmic solution Place 1 drop into the right eye daily.   lisinopril (ZESTRIL) 20 MG tablet TAKE 1 TABLET(20 MG) BY MOUTH IN THE MORNING   Multiple Vitamin (MULTIVITAMIN) tablet Take 1 tablet by mouth daily.   rosuvastatin (CRESTOR) 40 MG tablet TAKE 1 TABLET(40 MG) BY MOUTH DAILY   traZODone (DESYREL) 100 MG tablet TAKE 1/2 TO 1 TABLET BY MOUTH AT BEDTIME AS NEEDED FOR SLEEP     Allergies:   Keflex [cephalexin]   Social History   Socioeconomic History   Marital status: Married    Spouse name: Victor Knight   Number of children: 1   Years of education: Not on file   Highest education level: High school graduate  Occupational History   Occupation: Retired  Tobacco Use   Smoking status: Former   Smokeless tobacco: Never  Scientific laboratory technician Use: Never used  Substance and Sexual Activity   Alcohol use: Yes    Alcohol/week: 7.0 standard drinks of alcohol    Types: 7 Glasses of wine per week   Drug use: No   Sexual activity: Yes    Partners: Female  Other Topics Concern   Not on file  Social History Narrative   Not on file  Social Determinants of Health   Financial Resource Strain: Low Risk  (03/15/2021)   Overall Financial Resource Strain (CARDIA)    Difficulty of Paying Living Expenses: Not hard at all  Food Insecurity: No Food Insecurity (03/15/2021)   Hunger Vital Sign    Worried About Running Out of Food in the Last Year: Never true    Ran Out of Food in the Last Year: Never true  Transportation Needs: No Transportation Needs (03/15/2021)   PRAPARE - Hydrologist (Medical): No    Lack of Transportation (Non-Medical): No  Physical Activity: Sufficiently Active (03/15/2021)   Exercise Vital Sign    Days of Exercise per Week: 3 days    Minutes of Exercise per Session: 60 min  Stress: No Stress Concern Present (03/15/2021)   [place]    Feeling of Stress : Not at all  Social Connections: [place] (03/15/2021)   Social Connection and Isolation Panel [NHANES]    Frequency of Communication with Friends and Family: More than three times a week    Frequency of Social Gatherings with Friends and Family: Three times a week    Attends Religious Services: More than 4 times per year    Active Member of Clubs or Organizations: Yes    Attends Music therapist: More than 4 times per year    Marital Status: Married     Family History: The patient's family history includes Atrial fibrillation in his brother and brother; Heart attack in his father; Heart disease in his brother, daughter, and father; Hypertension in his brother, brother, brother, brother, father, and sister; Supraventricular tachycardia in his daughter.  ROS:   Please see the history of present illness.     All other systems reviewed and are negative.  EKGs/Labs/Other Studies Reviewed:    The following studies were reviewed today:   EKG:  EKG is  ordered today.  The ekg ordered today demonstrates sinus bradycardia, heart rate 56, possible old inferior infarct  Recent Labs: 06/27/2021: ALT 38; BUN 18; Creat 1.15; Hemoglobin 15.6; Platelets 173; Potassium 4.5; Sodium 140  Recent Lipid Panel    Component Value Date/Time   CHOL 148 06/27/2021 1007   TRIG 104 06/27/2021 1007   HDL 60 06/27/2021 1007   CHOLHDL 2.5 06/27/2021 1007   VLDL 24 08/11/2016 1013   LDLCALC 69 06/27/2021 1007     Risk Assessment/Calculations:             Physical Exam:    VS:  BP 138/88 (BP Location: Right Arm, Patient Position: Sitting, Cuff Size: Normal)   Pulse (!) 56   Ht '5\' 9"'$  (1.753 m)   Wt 190 lb 2 oz (86.2 kg)   SpO2 98%   BMI 28.08 kg/m     Wt Readings from Last 3 Encounters:  12/09/21 190 lb 2 oz (86.2 kg)  11/08/21 188 lb 8 oz (85.5 kg)  10/24/21 184 lb 1.6 oz (83.5  kg)     GEN:  Well nourished, well developed in no acute distress HEENT: Normal NECK: No JVD; No carotid bruits CARDIAC: RRR, no murmurs, rubs, gallops RESPIRATORY:  Clear to auscultation without rales, wheezing or rhonchi  ABDOMEN: Soft, non-tender, non-distended MUSCULOSKELETAL:  No edema; No deformity  SKIN: Warm and dry NEUROLOGIC:  Alert and oriented x 3 PSYCHIATRIC:  Normal affect   ASSESSMENT:    1. Coronary artery disease involving native coronary artery of native heart  without angina pectoris   2. Primary hypertension    PLAN:    In order of problems listed above:  CAD, LAD and RCA calcifications.  Denies chest pain.  LDL at goal.  Continue Crestor, Zetia.  On Eliquis due to PE.  Obtain echocardiogram.  Consider stress test if patient becomes symptomatic, heart cath if EF reduced. Hypertension, BP controlled, continue lisinopril 20 mg daily.  Follow-up after echo       Medication Adjustments/Labs and Tests Ordered: Current medicines are reviewed at length with the patient today.  Concerns regarding medicines are outlined above.  Orders Placed This Encounter  Procedures   EKG 12-Lead   ECHOCARDIOGRAM COMPLETE   No orders of the defined types were placed in this encounter.   Patient Instructions  Medication Instructions:   Your physician recommends that you continue on your current medications as directed. Please refer to the Current Medication list given to you today.  *If you need a refill on your cardiac medications before your next appointment, please call your pharmacy*   Testing/Procedures:  Your physician has requested that you have an echocardiogram. Echocardiography is a painless test that uses sound waves to create images of your heart. It provides your doctor with information about the size and shape of your heart and how well your heart's chambers and valves are working. This procedure takes approximately one hour. There are no restrictions for  this procedure.    Follow-Up: At [facility], you and your health needs are our priority.  As part of our continuing mission to provide you with exceptional heart care, we have created designated Provider Care Teams.  These Care Teams include your primary Cardiologist (physician) and Advanced Practice Providers (APPs -  Physician Assistants and Nurse Practitioners) who all work together to provide you with the care you need, when you need it.  We recommend signing up for the patient portal called "MyChart".  Sign up information is provided on this After Visit Summary.  MyChart is used to connect with patients for Virtual Visits (Telemedicine).  Patients are able to view lab/test results, encounter notes, upcoming appointments, etc.  Non-urgent messages can be sent to your provider as well.   To learn more about what you can do with MyChart, go to [email or web address].    Your next appointment:   Follow up after testing   The format for your next appointment:   In Person  Provider:   Kate Sable, MD    Other Instructions   Important Information About Sugar      s   Signed, Victor Sable, MD  12/09/2021 4:19 PM    [place]

## 2021-12-09 NOTE — Patient Instructions (Signed)
Medication Instructions:   Your physician recommends that you continue on your current medications as directed. Please refer to the Current Medication list given to you today.  *If you need a refill on your cardiac medications before your next appointment, please call your pharmacy*   Testing/Procedures:  Your physician has requested that you have an echocardiogram. Echocardiography is a painless test that uses sound waves to create images of your heart. It provides your doctor with information about the size and shape of your heart and how well your heart's chambers and valves are working. This procedure takes approximately one hour. There are no restrictions for this procedure.    Follow-Up: At [facility], you and your health needs are our priority.  As part of our continuing mission to provide you with exceptional heart care, we have created designated Provider Care Teams.  These Care Teams include your primary Cardiologist (physician) and Advanced Practice Providers (APPs -  Physician Assistants and Nurse Practitioners) who all work together to provide you with the care you need, when you need it.  We recommend signing up for the patient portal called "MyChart".  Sign up information is provided on this After Visit Summary.  MyChart is used to connect with patients for Virtual Visits (Telemedicine).  Patients are able to view lab/test results, encounter notes, upcoming appointments, etc.  Non-urgent messages can be sent to your provider as well.   To learn more about what you can do with MyChart, go to [email or web address].    Your next appointment:   Follow up after testing   The format for your next appointment:   In Person  Provider:   Kate Sable, MD    Other Instructions   Important Information About Sugar      s

## 2021-12-30 ENCOUNTER — Ambulatory Visit: Payer: Medicare PPO | Admitting: Family Medicine

## 2022-01-10 ENCOUNTER — Ambulatory Visit: Payer: Medicare PPO

## 2022-01-13 ENCOUNTER — Other Ambulatory Visit: Payer: Self-pay | Admitting: Family Medicine

## 2022-01-13 DIAGNOSIS — G4700 Insomnia, unspecified: Secondary | ICD-10-CM

## 2022-01-13 NOTE — Telephone Encounter (Signed)
Requested Prescriptions  Pending Prescriptions Disp Refills  . traZODone (DESYREL) 100 MG tablet [Pharmacy Med Name: TRAZODONE '100MG'$  TABLETS] 90 tablet 0    Sig: TAKE 1/2 TO 1 TABLET BY MOUTH AT BEDTIME AS NEEDED FOR SLEEP     Psychiatry: Antidepressants - Serotonin Modulator Passed - 01/13/2022  3:16 AM      Passed - Valid encounter within last 6 months    Recent Outpatient Visits          2 months ago Encounter for examination following treatment at hospital   [facility] Delsa Grana, PA-C   6 months ago Essential hypertension   [facility] Delsa Grana, PA-C   1 year ago Essential hypertension   [facility] Delsa Grana, PA-C   1 year ago Hyperlipidemia LDL goal <70   [facility] Delsa Grana, PA-C   1 year ago Essential hypertension   [facility] Delsa Grana, [place]      Future Appointments            In 2 weeks Ag[place]-Et[place], Aaron Edelman, MD [place]. [place]   In 2 months  [facility], [place]   In 5 months Delsa Grana, PA-C [facility], [place]   In 6 months Nehemiah Massed Monia Sabal, MD [place]

## 2022-01-19 ENCOUNTER — Other Ambulatory Visit: Payer: Self-pay | Admitting: Family Medicine

## 2022-01-19 DIAGNOSIS — G4700 Insomnia, unspecified: Secondary | ICD-10-CM

## 2022-01-30 ENCOUNTER — Ambulatory Visit: Payer: Medicare PPO | Admitting: Cardiology

## 2022-02-03 ENCOUNTER — Ambulatory Visit: Payer: Medicare PPO | Attending: Cardiology

## 2022-02-03 DIAGNOSIS — I2510 Atherosclerotic heart disease of native coronary artery without angina pectoris: Secondary | ICD-10-CM

## 2022-02-03 LAB — ECHOCARDIOGRAM COMPLETE
AR max vel: 4.83 cm2
AV Area VTI: 4.43 cm2
AV Area mean vel: 4.05 cm2
AV Mean grad: 4 mmHg
AV Peak grad: 7 mmHg
Ao pk vel: 1.32 m/s
Area-P 1/2: 3.54 cm2
Calc EF: 53.4 %
S' Lateral: 3.45 cm
Single Plane A2C EF: 53.6 %
Single Plane A4C EF: 54 %

## 2022-02-20 ENCOUNTER — Other Ambulatory Visit: Payer: Self-pay

## 2022-02-20 DIAGNOSIS — E785 Hyperlipidemia, unspecified: Secondary | ICD-10-CM

## 2022-02-20 DIAGNOSIS — I700 Atherosclerosis of aorta: Secondary | ICD-10-CM

## 2022-02-20 DIAGNOSIS — Z87891 Personal history of nicotine dependence: Secondary | ICD-10-CM

## 2022-02-20 MED ORDER — ROSUVASTATIN CALCIUM 40 MG PO TABS: ORAL_TABLET | ORAL | 3 refills | Status: DC

## 2022-02-23 ENCOUNTER — Other Ambulatory Visit: Payer: Self-pay

## 2022-02-23 DIAGNOSIS — M1000 Idiopathic gout, unspecified site: Secondary | ICD-10-CM

## 2022-02-23 MED ORDER — ALLOPURINOL 100 MG PO TABS: ORAL_TABLET | ORAL | 1 refills | Status: DC

## 2022-02-23 NOTE — Telephone Encounter (Signed)
Pt has not followed up within 6 months

## 2022-03-06 ENCOUNTER — Ambulatory Visit: Payer: Medicare PPO | Attending: Cardiology | Admitting: Cardiology

## 2022-03-06 ENCOUNTER — Encounter: Payer: Self-pay | Admitting: Cardiology

## 2022-03-16 ENCOUNTER — Ambulatory Visit: Payer: Medicare PPO

## 2022-03-21 ENCOUNTER — Encounter: Payer: Self-pay | Admitting: Family Medicine

## 2022-03-21 ENCOUNTER — Ambulatory Visit (INDEPENDENT_AMBULATORY_CARE_PROVIDER_SITE_OTHER): Payer: Medicare PPO | Admitting: Family Medicine

## 2022-03-21 DIAGNOSIS — G4700 Insomnia, unspecified: Secondary | ICD-10-CM

## 2022-03-21 DIAGNOSIS — Z0000 Encounter for general adult medical examination without abnormal findings: Secondary | ICD-10-CM | POA: Diagnosis not present

## 2022-03-21 MED ORDER — APIXABAN 2.5 MG PO TABS: 2.5000 mg | ORAL_TABLET | Freq: Two times a day (BID) | ORAL | 1 refills | Status: DC

## 2022-03-21 MED ORDER — TRAZODONE HCL 100 MG PO TABS: 50.0000 mg | ORAL_TABLET | Freq: Every evening | ORAL | 1 refills | Status: DC | PRN

## 2022-03-21 NOTE — Patient Instructions (Signed)
  Victor Knight , Thank you for taking time to come for your Medicare Wellness Visit. I appreciate your ongoing commitment to your health goals. Please review the following plan we discussed and let me know if I can assist you in the future.   These are the goals we discussed:  Goals       Chronic Care Management      CARE PLAN ENTRY  Current Barriers:  Chronic Disease Management support, education, and care coordination needs related to Hypertension and Hyperlipidemia   Hypertension Pharmacist Clinical Goal(s): Over the next 90 days, patient will work with PharmD and providers to maintain BP goal <130/80 Current regimen:  Lisinopril 20 mg daily Patient self care activities - Over the next 90 days, patient will: Check BP 3-5 times weekly, document, and provide at future appointments Ensure daily salt intake < 2300 mg/day  Hyperlipidemia Pharmacist Clinical Goal(s): Over the next 90 days, patient will work with PharmD and providers to maintain LDL goal < 70 Current regimen:  Crestor '40mg'$  daily  Medication management Pharmacist Clinical Goal(s): Over the next 90 days, patient will work with PharmD and providers to maintain optimal medication adherence Current pharmacy: [facility] Comprehensive medication review performed. Continue current medication management strategy Patient self care activities - Over the next 90 days, patient will: Report any questions or concerns to PharmD and/or provider(s)      DIET - INCREASE WATER INTAKE (pt-stated)      Weight (lb) < 175 lb (79.4 kg)      Wants to loose a little weight and he is working on walking        This is a list of the screening recommended for you and due dates:  Health Maintenance  Topic Date Due   COVID-19 Vaccine (5 - 2023-24 season) 04/06/2022*   Zoster (Shingles) Vaccine (1 of 2) 06/20/2022*   Flu Shot  07/16/2022*   Medicare Annual Wellness Visit  03/22/2023   Colon Cancer Screening  04/18/2023    DTaP/Tdap/Td vaccine (2 - Td or Tdap) 09/28/2025   Pneumonia Vaccine  Completed   Hepatitis C Screening: USPSTF Recommendation to screen - Ages 84-79 yo.  Completed   HPV Vaccine  Aged Out  *Topic was postponed. The date shown is not the original due date.    Immunization History  Administered Date(s) Administered   Fluad Quad(high Dose 65+) 01/23/2019, 01/07/2020   Influenza, High Dose Seasonal PF 01/08/2017, 02/02/2018, 12/16/2020   Influenza-Unspecified 12/17/2015   Moderna Covid-19 Vaccine Bivalent Booster 33yr & up 12/16/2020   Moderna Sars-Covid-2 Vaccination 05/15/2019, 06/12/2019, 02/12/2020   Pneumococcal Conjugate-13 10/05/2015, 01/08/2017   Pneumococcal Polysaccharide-23 02/28/2019   Tdap 09/29/2015

## 2022-03-21 NOTE — Progress Notes (Unsigned)
Subjective:   Victor Knight is a 71 y.o. male who presents for Medicare Annual/Subsequent preventive examination.  Review of Systems:   Cardiac Risk Factors include: advanced age (>36mn, >>45women);dyslipidemia;hypertension;family history of premature cardiovascular disease;male gender     Objective:    Vitals: BP 136/72   Pulse 97   Temp 98.8 F (37.1 C) (Oral)   Resp 16   Ht '5\' 9"'$  (1.753 m)   Wt 189 lb 8 oz (86 kg)   SpO2 98%   BMI 27.98 kg/m   Body mass index is 27.98 kg/m.     11/08/2021    9:43 AM 03/15/2021    1:41 PM 03/09/2020    1:45 PM 06/17/2019    2:18 PM 05/16/2019    3:20 PM 04/28/2019    2:29 AM 03/07/2019   11:35 AM  Advanced Directives  Does Patient Have a Medical Advance Directive? Yes Yes Yes Yes Yes Yes Yes  Type of AParamedicof AAplinLiving will HKo [place]Living will HBeach [place]Living will HAquadaleLiving will HMint HillLiving will HVivianLiving will HLingleLiving will  Does patient want to make changes to medical advance directive?    No - Patient declined No - Patient declined    Copy of [place]aft [place]in Chart?  Yes - validated most recent copy scanned in chart (See row information) Yes - validated most recent copy scanned in chart (See row information) No - copy requested No - copy requested  Yes - validated most recent copy scanned in chart (See row information)    Tobacco Social History   Tobacco Use  Smoking Status Former  Smokeless Tobacco Never     Counseling given: Not Answered   Clinical Intake:  Pre-visit preparation completed: No  Pain : No/denies pain     BMI - recorded: 27.98 Nutritional Status: BMI 25 -29 Overweight Nutritional Risks: None Diabetes: No  How often do you need to have someone help you when you read instructions, pamphlets, or other written  materials from your doctor or pharmacy?: 1 - Never What is the last grade level you completed in school?: high school  Interpreter Needed?: No  Information entered by :: Aerilynn Goin  Past Medical History:  Diagnosis Date   [place]dominal aortic atherosclerosis ([place] 10/27/2015   Adhesive capsulitis of shoulder 04/09/2017   [place] ectasia, abdominal ([facility] 10/27/2015   Bilateral pulmonary embolism ([facility] 04/28/2019   Bursitis of shoulder 04/09/2017   Complete tear of right rotator cuff 04/20/2016   Dupuytren contracture 04/09/2017   Dysplastic nevus 07/13/2014   L epigastric - moder to severe, excision 08/25/2014   Dysplastic nevus 11/03/2015   R mid back 5.0 cm lat to spine - moderate   Gout    Hyperlipidemia    Hypertension    Insomnia    Plantar fasciitis of left foot 03/31/2019   Tendinitis of shoulder 04/09/2017   Past Surgical History:  Procedure Laterality Date   CARPAL TUNNEL RELEASE     rotator cuff surgery   04/20/2016   right arm; [facility]    TRIGGER FINGER RELEASE     Family History  Problem Relation Age of Onset   Heart attack Father    Hypertension Father    Heart disease Father    Hypertension Sister    Hypertension Brother    Hypertension Brother    Atrial fibrillation Brother    Hypertension Brother  Hypertension Brother    Heart disease Brother    Atrial fibrillation Brother    Supraventricular tachycardia Daughter    Heart disease Daughter    Social History   Socioeconomic History   Marital status: Married    Spouse name: [name]   Number of children: 1   Years of education: Not on file   Highest education level: High school graduate  Occupational History   Occupation: Retired  Tobacco Use   Smoking status: Former   Smokeless tobacco: Never  Scientific laboratory technician Use: Never used  Substance and Sexual Activity   Alcohol use: Yes    Alcohol/week: 7.0 standard drinks of alcohol    Types: 7 Glasses of wine per week   Drug use: No   Sexual  activity: Yes    Partners: Female  Other Topics Concern   Not on file  Social History Narrative   Not on file   Social Determinants of Health   Financial Resource Strain: Low Risk  (03/15/2021)   Overall Financial Resource Strain (CARDIA)    Difficulty of Paying Living Expenses: Not hard at all  Food Insecurity: No Food Insecurity (03/15/2021)   Hunger Vital Sign    Worried About Running Out of Food in the Last Year: Never true    Ran Out of Food in the Last Year: Never true  Transportation Needs: No Transportation Needs (03/15/2021)   PRAPARE - Hydrologist (Medical): No    Lack of Transportation (Non-Medical): No  Physical Activity: Sufficiently Active (03/15/2021)   Exercise Vital Sign    Days of Exercise per Week: 3 days    Minutes of Exercise per Session: 60 min  Stress: No Stress Concern Present (03/15/2021)   [place]    Feeling of Stress : Not at all  Social Connections: [place] (03/15/2021)   Social Connection and Isolation Panel [NHANES]    Frequency of Communication with Friends and Family: More than three times a week    Frequency of Social Gatherings with Friends and Family: Three times a week    Attends Religious Services: More than 4 times per year    Active Member of Clubs or Organizations: Yes    Attends Archivist Meetings: More than 4 times per year    Marital Status: Married    Outpatient Encounter Medications as of 03/21/2022  Medication Sig   allopurinol (ZYLOPRIM) 100 MG tablet TAKE 1 TABLET(100 MG) BY MOUTH DAILY   ezetimibe (ZETIA) 10 MG tablet Take 1 tablet (10 mg total) by mouth daily.   latanoprost (XALATAN) 0.005 % ophthalmic solution Place 1 drop into the right eye daily.   lisinopril (ZESTRIL) 20 MG tablet TAKE 1 TABLET(20 MG) BY MOUTH IN THE MORNING   Multiple Vitamin (MULTIVITAMIN) tablet Take 1 tablet by mouth daily.    rosuvastatin (CRESTOR) 40 MG tablet TAKE 1 TABLET(40 MG) BY MOUTH DAILY   [DISCONTINUED] apixaban (ELIQUIS) 5 MG TABS tablet Take 1 tablet (5 mg total) by mouth 2 (two) times daily. (Patient taking differently: Take 2.5 mg by mouth 2 (two) times daily.)   [DISCONTINUED] atorvastatin (LIPITOR) 40 MG tablet    [DISCONTINUED] traZODone (DESYREL) 100 MG tablet TAKE 1/2 TO 1 TABLET BY MOUTH AT BEDTIME AS NEEDED FOR SLEEP   apixaban (ELIQUIS) 2.5 MG TABS tablet Take 1 tablet (2.5 mg total) by mouth 2 (two) times daily.   traZODone (DESYREL) 100 MG tablet Take  0.5-1 tablets (50-100 mg total) by mouth at bedtime as needed. for sleep   No facility-administered encounter medications on file as of 03/21/2022.    Activities of Daily Living    03/21/2022    1:32 PM 10/24/2021    1:48 PM  In your present state of health, do you have any difficulty performing the following activities:  Hearing? 0 0  Vision? 0 0  Difficulty concentrating or making decisions? 0 0  Walking or climbing stairs? 0 0  Dressing or bathing? 0 0  Doing errands, shopping? 0 0  Preparing Food and eating ? N   Using the Toilet? N   In the past six months, have you accidently leaked urine? N   Do you have problems with loss of bowel control? N   Managing your Medications? N   Managing your Finances? N   Housekeeping or managing your Housekeeping? N     Patient Care Team: Delsa Grana, PA-C as PCP - General (Family Medicine) Kate Sable, MD as PCP - Cardiology (Cardiology) Ralene Bathe, MD (Dermatology) Estill Cotta, MD (Ophthalmology) Germaine Pomfret, [facility] as Pharmacist (Pharmacist)   Assessment:   This is a routine wellness examination for Zakariyya.  Exercise Activities and Dietary recommendations Current Exercise Habits: Home exercise routine, Type of exercise: walking, Time (Minutes): 60, Frequency (Times/Week): 7, Weekly Exercise (Minutes/Week): 420, Intensity: Moderate, Exercise limited by: None  identified   Goals Addressed             This Visit's Progress    Weight (lb) < 175 lb (79.4 kg)   189 lb 8 oz (86 kg)    Wants to loose a little weight and he is working on walking        Fall Risk:    03/21/2022    1:32 PM 10/24/2021    1:47 PM 06/27/2021    9:17 AM 03/15/2021    1:41 PM 10/27/2020    2:51 PM  Fall Risk   Falls in the past year? 0 0 0 0 0  Number falls in past yr: 0 0 0 0 0  Injury with Fall? 0 0 0 0 0  Risk for fall due to : No Fall Risks No Fall Risks No Fall Risks No Fall Risks   Follow up Falls prevention discussed;Education provided;Falls evaluation completed Falls prevention discussed;Education provided Falls prevention discussed Falls prevention discussed     FALL RISK PREVENTION PERTAINING TO THE HOME:  Any stairs in or around the home? Yes  If so, are there any without handrails? Yes   Home free of loose throw rugs in walkways, pet beds, electrical cords, etc? Yes  Adequate lighting in your home to reduce risk of falls? Yes   ASSISTIVE DEVICES UTILIZED TO PREVENT FALLS:  Life alert? No  Use of a cane, walker or w/c? No  Grab bars in the bathroom? Yes  Shower chair or bench in shower? Yes  Elevated toilet seat or a handicapped toilet? Yes   TIMED UP AND GO:  Was the test performed? Yes .  Length of time to ambulate 10 feet: 10 sec.   GAIT:  Appearance of gait: Gait steady and fast without the use of an assistive device.  Education: Fall risk prevention has been discussed.  Intervention(s) required? No  DME/home health order needed?  No   Depression Screen    03/21/2022    1:32 PM 10/24/2021    1:48 PM 06/27/2021    9:17 AM 03/15/2021  1:40 PM  PHQ 2/9 Scores  PHQ - 2 Score 0 0 0 0  PHQ- 9 Score 0 0 0     Cognitive Function        07/03/2017   11:50 AM  6CIT Screen  What Year? 0 points  What month? 0 points  What time? 0 points  Count back from 20 0 points  Months in reverse 0 points  Repeat phrase 0 points   Total Score 0 points    Immunization History  Administered Date(s) Administered   Fluad Quad(high Dose 65+) 01/23/2019, 01/07/2020   Influenza, High Dose Seasonal PF 01/08/2017, 02/02/2018, 12/16/2020   Influenza-Unspecified 12/17/2015   Moderna Covid-19 Vaccine Bivalent Booster 81yr & up 12/16/2020   Moderna Sars-Covid-2 Vaccination 05/15/2019, 06/12/2019, 02/12/2020   Pneumococcal Conjugate-13 10/05/2015, 01/08/2017   Pneumococcal Polysaccharide-23 02/28/2019   Tdap 09/29/2015    Qualifies for Shingles Vaccine? Yes . Due for Shingrix. Education has been provided regarding the importance of this vaccine. Pt has been advised to call insurance company to determine out of pocket expense. Advised may also receive vaccine at local pharmacy or Health Dept. Verbalized acceptance and understanding.  Tdap: Although this vaccine is not a covered service during a Wellness Exam, does the patient still wish to receive this vaccine today?  Yes .  Education has been provided regarding the importance of this vaccine. Advised may receive this vaccine at local pharmacy or Health Dept. Aware to provide a copy of the vaccination record if obtained from local pharmacy or Health Dept. Verbalized acceptance and understanding.  [facility]  [facility] . Education has been provided regarding the importance of this vaccine but still declined. Advised may receive this vaccine at local pharmacy or Health Dept. Aware to provide a copy of the vaccination record if obtained from local pharmacy or Health Dept. Verbalized acceptance and understanding.  Pneumococcal Vaccine:  Completed 02/28/2019.  Covid-19 Vaccine:  Completed vaccines  Screening Tests Health Maintenance  Topic Date Due   COVID-19 Vaccine (5 - 2023-24 season) 04/06/2022 (Originally 12/16/2021)   Zoster Vaccines- Shingrix (1 of 2) 06/20/2022 (Originally 09/04/1969)   INFLUENZA VACCINE  07/16/2022 (Originally 11/15/2021)    Medicare Annual Wellness (AWV)  03/22/2023   COLONOSCOPY (Pts 45-416yrInsurance coverage will need to be confirmed)  04/18/2023   DTaP/Tdap/Td (2 - Td or Tdap) 09/28/2025   Pneumonia Vaccine 6571Years old  Completed   Hepatitis C Screening  Completed   HPV VACCINES  Aged Out   Cancer Screenings:  Colorectal Screening: Completed 04/17/2013. Repeat every 10 years  Lung Cancer Screening: (Low Dose CT Chest recommended if Age 71-80ears, 30 pack-year currently smoking OR have quit w/in 15years.) does not qualify.   Lung Cancer Screening Referral: An Epic message has been sent to ShBurgess EstelleRN (Oncology Nurse Navigator) regarding the possible need for this exam. ShRaquel Sarnaill review the patient's chart to determine if the patient truly qualifies for the exam. If the patient qualifies, ShRaquel Sarnaill order the Low Dose CT of the chest to facilitate the scheduling of this exam.  Additional Screening:  Hepatitis C Screening: does qualify; Completed 02/26/2019  Vision Screening: Recommended annual ophthalmology exams for early detection of glaucoma and other disorders of the eye. Is the patient up to date with their annual eye exam?  Yes  Who is the provider or what is the name of the office in which the pt attends annual eye exams? [facility] If pt is  not established with a provider, would they like to be referred to a provider to establish care?  Already established .  Dental Screening: Recommended annual dental exams for proper oral hygiene  Community Resource Referral:  CRR required this visit?  No        Plan:  I have personally reviewed and addressed the Medicare Annual Wellness questionnaire and have noted the following in the patient's chart:  A. Medical and social history B. Use of alcohol, tobacco or illicit drugs  C. Current medications and supplements D. Functional ability and status E.  Nutritional status F.  Physical activity G. Advance directives H. List of other  physicians I.  Hospitalizations, surgeries, and ER visits in previous 12 months J.  [place] such as hearing and vision if needed, cognitive and depression L. Referrals and appointments   In addition, I have reviewed and discussed with patient certain preventive protocols, quality metrics, and best practice recommendations. A written personalized care plan for preventive services as well as general preventive health recommendations were provided to patient.   Signed,   Delsa Grana, PA-C  03/22/2022

## 2022-04-21 ENCOUNTER — Encounter: Payer: Self-pay | Admitting: Cardiology

## 2022-04-21 ENCOUNTER — Other Ambulatory Visit: Payer: Self-pay

## 2022-04-21 ENCOUNTER — Ambulatory Visit: Payer: Medicare PPO | Attending: Cardiology | Admitting: Cardiology

## 2022-04-21 VITALS — BP 136/90 | HR 73 | Ht 69.0 in | Wt 196.5 lb

## 2022-04-21 DIAGNOSIS — I10 Essential (primary) hypertension: Secondary | ICD-10-CM | POA: Diagnosis not present

## 2022-04-21 DIAGNOSIS — E785 Hyperlipidemia, unspecified: Secondary | ICD-10-CM | POA: Diagnosis not present

## 2022-04-21 DIAGNOSIS — I2510 Atherosclerotic heart disease of native coronary artery without angina pectoris: Secondary | ICD-10-CM | POA: Diagnosis not present

## 2022-04-21 DIAGNOSIS — I77810 Thoracic aortic ectasia: Secondary | ICD-10-CM

## 2022-04-21 MED ORDER — LISINOPRIL 20 MG PO TABS: 30.0000 mg | ORAL_TABLET | Freq: Every day | ORAL | 3 refills | Status: DC

## 2022-04-21 MED ORDER — ROSUVASTATIN CALCIUM 40 MG PO TABS: ORAL_TABLET | ORAL | 3 refills | Status: DC

## 2022-04-21 MED ORDER — EZETIMIBE 10 MG PO TABS: 10.0000 mg | ORAL_TABLET | Freq: Every day | ORAL | 3 refills | Status: DC

## 2022-04-21 NOTE — Patient Instructions (Signed)
Medication Instructions:   INCREASE Lisinopril - take one and a half tablets ('30mg'$ ) by mouth daily.   *If you need a refill on your cardiac medications before your next appointment, please call your pharmacy*   Lab Work:  None Ordered  If you have labs (blood work) drawn today and your tests are completely normal, you will receive your results only by: [place] (if you have MyChart) OR A paper copy in the mail If you have any lab test that is abnormal or we need to change your treatment, we will call you to review the results.   Testing/Procedures:  None Ordered   Follow-Up: At [facility], you and your health needs are our priority.  As part of our continuing mission to provide you with exceptional heart care, we have created designated Provider Care Teams.  These Care Teams include your primary Cardiologist (physician) and Advanced Practice Providers (APPs -  Physician Assistants and Nurse Practitioners) who all work together to provide you with the care you need, when you need it.  We recommend signing up for the patient portal called "MyChart".  Sign up information is provided on this After Visit Summary.  MyChart is used to connect with patients for Virtual Visits (Telemedicine).  Patients are able to view lab/test results, encounter notes, upcoming appointments, etc.  Non-urgent messages can be sent to your provider as well.   To learn more about what you can do with MyChart, go to [email or web address].    Your next appointment:   6 month(s)  The format for your next appointment:   In Person  Provider:   You may see Kate Sable, MD or one of the following Advanced Practice Providers on your designated Care Team:   Murray Hodgkins, NP Christell Faith, PA-C Cadence Kathlen Mody, PA-C Gerrie Nordmann, NP

## 2022-04-21 NOTE — Progress Notes (Signed)
Cardiology Office Note:    Date:  04/21/2022   ID:  JAKAIDEN FILL, DOB [birth year], MRN [id number]  PCP:  Delsa Grana, [place] [facility] Cardiologist:  Kate Sable, MD     Referring MD: Delsa Grana, PA-C   Chief Complaint  Patient presents with   OTHER    F/u echo no complaints today. Meds reviewed verbally with pt.    History of Present Illness:    Victor Knight is a 72 y.o. male with a hx of CAD (LAD and RCA calcifications on chest CT ), hypertension, hyperlipidemia, PE on Eliquis presenting for follow-up.   Being seen for CAD/coronary calcifications.  Echocardiogram obtained to evaluate any structural abnormalities, presents for results.  Denies chest pain or shortness of breath.  Has not been checking blood pressures at home lately.  Currently takes lisinopril 20 mg daily.  Compliant with medications as prescribed.  Cholesterol adequately controlled on Crestor, Zetia.  Prior notes CT chest 11/2021 showed LAD, RCA calcifications.    Past Medical History:  Diagnosis Date   Abdominal aortic atherosclerosis ([facility]) 10/27/2015   Adhesive capsulitis of shoulder 04/09/2017   Aortic ectasia, abdominal ([name]) 10/27/2015   Bilateral pulmonary embolism ([place]) 04/28/2019   Bursitis of shoulder 04/09/2017   Complete tear of right rotator cuff 04/20/2016   Dupuytren contracture 04/09/2017   Dysplastic nevus 07/13/2014   L epigastric - moder to severe, excision 08/25/2014   Dysplastic nevus 11/03/2015   R mid back 5.0 cm lat to spine - moderate   Gout    Hyperlipidemia    Hypertension    Insomnia    Plantar fasciitis of left foot 03/31/2019   Tendinitis of shoulder 04/09/2017    Past Surgical History:  Procedure Laterality Date   CARPAL TUNNEL RELEASE     rotator cuff surgery   04/20/2016   right arm; [facility]    TRIGGER FINGER RELEASE      Current Medications: Current Meds  Medication Sig   allopurinol (ZYLOPRIM) 100 MG tablet TAKE 1 TABLET(100  MG) BY MOUTH DAILY   apixaban (ELIQUIS) 2.5 MG TABS tablet Take 1 tablet (2.5 mg total) by mouth 2 (two) times daily.   latanoprost (XALATAN) 0.005 % ophthalmic solution Place 1 drop into the right eye daily.   Multiple Vitamin (MULTIVITAMIN) tablet Take 1 tablet by mouth daily.   traZODone (DESYREL) 100 MG tablet Take 0.5-1 tablets (50-100 mg total) by mouth at bedtime as needed. for sleep   [DISCONTINUED] ezetimibe (ZETIA) 10 MG tablet Take 1 tablet (10 mg total) by mouth daily.   [DISCONTINUED] lisinopril (ZESTRIL) 20 MG tablet TAKE 1 TABLET(20 MG) BY MOUTH IN THE MORNING   [DISCONTINUED] rosuvastatin (CRESTOR) 40 MG tablet TAKE 1 TABLET(40 MG) BY MOUTH DAILY     Allergies:   Keflex [cephalexin]   Social History   Socioeconomic History   Marital status: Married    Spouse name: Victor Knight   Number of children: 1   Years of education: Not on file   Highest education level: High school graduate  Occupational History   Occupation: Retired  Tobacco Use   Smoking status: Former   Smokeless tobacco: Never  Scientific laboratory technician Use: Never used  Substance and Sexual Activity   Alcohol use: Yes    Alcohol/week: 7.0 standard drinks of alcohol    Types: 7 Glasses of wine per week   Drug use: No   Sexual activity: Yes    Partners: Female  Other  Topics Concern   Not on file  Social History Narrative   Not on file   Social Determinants of Health   Financial Resource Strain: Low Risk  (03/15/2021)   Overall Financial Resource Strain (CARDIA)    Difficulty of Paying Living Expenses: Not hard at all  Food Insecurity: No Food Insecurity (03/15/2021)   Hunger Vital Sign    Worried About Running Out of Food in the Last Year: Never true    Ran Out of Food in the Last Year: Never true  Transportation Needs: No Transportation Needs (03/15/2021)   PRAPARE - Hydrologist (Medical): No    Lack of Transportation (Non-Medical): No  Physical Activity: Sufficiently  Active (03/15/2021)   Exercise Vital Sign    Days of Exercise per Week: 3 days    Minutes of Exercise per Session: 60 min  Stress: No Stress Concern Present (03/15/2021)   [place]    Feeling of Stress : Not at all  Social Connections: [name] (03/15/2021)   Social Connection and Isolation Panel [NHANES]    Frequency of Communication with Friends and Family: More than three times a week    Frequency of Social Gatherings with Friends and Family: Three times a week    Attends Religious Services: More than 4 times per year    Active Member of Clubs or Organizations: Yes    Attends Music therapist: More than 4 times per year    Marital Status: Married     Family History: The patient's family history includes Atrial fibrillation in his brother and brother; Heart attack in his father; Heart disease in his brother, daughter, and father; Hypertension in his brother, brother, brother, brother, father, and sister; Supraventricular tachycardia in his daughter.  ROS:   Please see the history of present illness.     All other systems reviewed and are negative.  EKGs/Labs/Other Studies Reviewed:    The following studies were reviewed today:   EKG:  EKG not ordered today.    Recent Labs: 06/27/2021: ALT 38; BUN 18; Creat 1.15; Hemoglobin 15.6; Platelets 173; Potassium 4.5; Sodium 140  Recent Lipid Panel    Component Value Date/Time   CHOL 148 06/27/2021 1007   TRIG 104 06/27/2021 1007   HDL 60 06/27/2021 1007   CHOLHDL 2.5 06/27/2021 1007   VLDL 24 08/11/2016 1013   LDLCALC 69 06/27/2021 1007     Risk Assessment/Calculations:   HYPERTENSION CONTROL Vitals:   04/21/22 1007 04/21/22 1009  BP: (!) 138/90 (!) 136/90    The patient's blood pressure is elevated above target today.  In order to address the patient's elevated BP: A current anti-hypertensive medication was adjusted today.             Physical Exam:    VS:  BP (!) 136/90 (BP Location: Left Arm, Patient Position: Sitting, Cuff Size: Normal)   Pulse 73   Ht '5\' 9"'$  (1.753 m)   Wt 196 lb 8 oz (89.1 kg)   SpO2 98%   BMI 29.02 kg/m     Wt Readings from Last 3 Encounters:  04/21/22 196 lb 8 oz (89.1 kg)  03/21/22 189 lb 8 oz (86 kg)  12/09/21 190 lb 2 oz (86.2 kg)     GEN:  Well nourished, well developed in no acute distress HEENT: Normal NECK: No JVD; No carotid bruits CARDIAC: RRR, no murmurs, rubs, gallops RESPIRATORY:  Clear to auscultation without  rales, wheezing or rhonchi  ABDOMEN: Soft, non-tender, non-distended MUSCULOSKELETAL:  No edema; No deformity  SKIN: Warm and dry NEUROLOGIC:  Alert and oriented x 3 PSYCHIATRIC:  Normal affect   ASSESSMENT:    1. Coronary artery disease involving native coronary artery of native heart without angina pectoris   2. Primary hypertension   3. Aortic root dilatation (HCC)   4. Hyperlipidemia LDL goal <70     PLAN:    In order of problems listed above:  CAD, LAD and RCA calcifications.  Denies chest pain.  Echo with EF 60 to 65%.  LDL at goal.  Continue Crestor, Zetia.  On Eliquis due to PE. Hypertension, BP elevated.  Increase lisinopril to 30 mg daily. Mild aortic root dilatation.  Monitor serially with echoes.  Follow-up in 6 months.     Medication Adjustments/Labs and Tests Ordered: Current medicines are reviewed at length with the patient today.  Concerns regarding medicines are outlined above.  No orders of the defined types were placed in this encounter.  Meds ordered this encounter  Medications   lisinopril (ZESTRIL) 20 MG tablet    Sig: Take 1.5 tablets (30 mg total) by mouth daily. TAKE 1 TABLET(20 MG) BY MOUTH IN THE MORNING    Dispense:  135 tablet    Refill:  3   ezetimibe (ZETIA) 10 MG tablet    Sig: Take 1 tablet (10 mg total) by mouth daily.    Dispense:  90 tablet    Refill:  3    Restart zetia in addition to crestor 40, LDL  not at goal and elevated   rosuvastatin (CRESTOR) 40 MG tablet    Sig: TAKE 1 TABLET(40 MG) BY MOUTH DAILY    Dispense:  90 tablet    Refill:  3    Patient Instructions  Medication Instructions:   INCREASE Lisinopril - take one and a half tablets ('30mg'$ ) by mouth daily.   *If you need a refill on your cardiac medications before your next appointment, please call your pharmacy*   Lab Work:  None Ordered  If you have labs (blood work) drawn today and your tests are completely normal, you will receive your results only by: [facility] (if you have MyChart) OR A paper copy in the mail If you have any lab test that is abnormal or we need to change your treatment, we will call you to review the results.   Testing/Procedures:  None Ordered   Follow-Up: At [facility], you and your health needs are our priority.  As part of our continuing mission to provide you with exceptional heart care, we have created designated Provider Care Teams.  These Care Teams include your primary Cardiologist (physician) and Advanced Practice Providers (APPs -  Physician Assistants and Nurse Practitioners) who all work together to provide you with the care you need, when you need it.  We recommend signing up for the patient portal called "MyChart".  Sign up information is provided on this After Visit Summary.  MyChart is used to connect with patients for Virtual Visits (Telemedicine).  Patients are able to view lab/test results, encounter notes, upcoming appointments, etc.  Non-urgent messages can be sent to your provider as well.   To learn more about what you can do with MyChart, go to [email or web address].    Your next appointment:   6 month(s)  The format for your next appointment:   In Person  Provider:   You may see Kate Sable, MD or one  of the following Advanced Practice Providers on your designated Care Team:   Murray Hodgkins, NP Christell Faith, PA-C Cadence Kathlen Mody,  PA-C Gerrie Nordmann, NP     Signed, Kate Sable, MD  04/21/2022 10:42 AM    [place]

## 2022-04-26 ENCOUNTER — Other Ambulatory Visit: Payer: Self-pay | Admitting: Family Medicine

## 2022-05-15 DIAGNOSIS — Z0100 Encounter for examination of eyes and vision without abnormal findings: Secondary | ICD-10-CM | POA: Diagnosis not present

## 2022-05-15 DIAGNOSIS — H401131 Primary open-angle glaucoma, bilateral, mild stage: Secondary | ICD-10-CM | POA: Diagnosis not present

## 2022-06-19 ENCOUNTER — Ambulatory Visit: Payer: Medicare PPO | Admitting: Family Medicine

## 2022-06-19 ENCOUNTER — Encounter: Payer: Self-pay | Admitting: Family Medicine

## 2022-06-19 ENCOUNTER — Other Ambulatory Visit: Payer: Self-pay | Admitting: Family Medicine

## 2022-06-19 VITALS — BP 130/80 | HR 100 | Temp 97.9°F | Resp 16 | Ht 69.0 in | Wt 193.8 lb

## 2022-06-19 DIAGNOSIS — E785 Hyperlipidemia, unspecified: Secondary | ICD-10-CM | POA: Diagnosis not present

## 2022-06-19 DIAGNOSIS — Z7901 Long term (current) use of anticoagulants: Secondary | ICD-10-CM

## 2022-06-19 DIAGNOSIS — M1000 Idiopathic gout, unspecified site: Secondary | ICD-10-CM

## 2022-06-19 DIAGNOSIS — I10 Essential (primary) hypertension: Secondary | ICD-10-CM

## 2022-06-19 DIAGNOSIS — J329 Chronic sinusitis, unspecified: Secondary | ICD-10-CM | POA: Diagnosis not present

## 2022-06-19 DIAGNOSIS — Z5181 Encounter for therapeutic drug level monitoring: Secondary | ICD-10-CM | POA: Diagnosis not present

## 2022-06-19 MED ORDER — AMOXICILLIN-POT CLAVULANATE 875-125 MG PO TABS: 1.0000 | ORAL_TABLET | Freq: Two times a day (BID) | ORAL | 0 refills | Status: DC

## 2022-06-19 NOTE — Patient Instructions (Signed)
Recommend trying a antihistamine every night, saline nasal spray and steroid nasal spray, sudafed, and keep taking tylenol. If any severe worsening you may start the antibiotic.  You likely have a viral upper respiratory infection (URI). Antibiotics will not reduce the number of days you are ill or prevent you from getting bacterial rhinosinusitis. A URI can take up to 14 days to resolve, but typically last between 7-11 days. Your body is so smart and strong that it will be fighting this illness off for you but it is important that you drink plenty of fluids, rest.    Viral Respiratory Infection A viral respiratory infection is an illness that affects parts of the body that are used for breathing. These include the lungs, nose, and throat. It is caused by a germ called a virus. Some examples of this kind of infection are: A cold. The flu (influenza). A respiratory syncytial virus (RSV) infection. What are the causes? This condition is caused by a virus. It spreads from person to person. You can get the virus if: You breathe in droplets from someone who is sick. You come in contact with people who are sick. You touch mucus or other fluid from a person who is sick. What are the signs or symptoms? Symptoms of this condition include: A stuffy or runny nose. A sore throat. A cough. Shortness of breath. Trouble breathing. Yellow or green fluid in the nose. Other symptoms may include: A fever. Sweating or chills. Tiredness (fatigue). Achy muscles. A headache. How is this treated? This condition may be treated with: Medicines that treat viruses. Medicines that make it easy to breathe. Medicines that are sprayed into the nose. Acetaminophen or NSAIDs, such as ibuprofen, to treat fever. Follow these instructions at home: Managing pain and congestion Take over-the-counter and prescription medicines only as told by your doctor. If you have a sore throat, gargle with salt water. Do this  3-4 times a day or as needed. To make salt water, dissolve -1 tsp (3-6 g) of salt in 1 cup (237 mL) of warm water. Make sure that all the salt dissolves. Use nose drops made from salt water. This helps with stuffiness (congestion). It also helps soften the skin around your nose. Take 2 tsp (10 mL) of honey at bedtime to lessen coughing at night. Do not give honey to children who are younger than 53 year old. Drink enough fluid to keep your pee (urine) pale yellow. General instructions  Rest as much as possible. Do not drink alcohol. Do not smoke or use any products that contain nicotine or tobacco. If you need help quitting, ask your doctor. Keep all follow-up visits. How is this prevented?     Get a flu shot every year. Ask your doctor when you should get your flu shot. Do not let other people get your germs. If you are sick: Wash your hands with soap and water often. Wash your hands after you cough or sneeze. Wash hands for at least 20 seconds. If you cannot use soap and water, use hand sanitizer. Cover your mouth when you cough. Cover your nose and mouth when you sneeze. Do not share cups or eating utensils. Clean commonly used objects often. Clean commonly touched surfaces. Stay home from work or school. Avoid contact with people who are sick during cold and flu season. This is in fall and winter. Get help if: Your symptoms last for 10 days or longer. Your symptoms get worse over time. You have very bad  pain in your face or forehead. Parts of your jaw or neck get very swollen. You have shortness of breath. Get help right away if: You feel pain or pressure in your chest. You have trouble breathing. You faint or feel like you will faint. You keep vomiting and it gets worse. You feel confused. These symptoms may be an emergency. Get help right away. Call your local emergency services (911 in the [place] Do not wait to see if the symptoms will go away. Do not drive yourself to  the hospital. Summary A viral respiratory infection is an illness that affects parts of the body that are used for breathing. Examples of this illness include a cold, the flu, and a respiratory syncytial virus (RSV) infection. The infection can cause a runny nose, cough, sore throat, and fever. Follow what your doctor tells you about taking medicines, drinking lots of fluid, washing your hands, resting at home, and avoiding people who are sick. This information is not intended to replace advice given to you by your health care provider. Make sure you discuss any questions you have with your health care provider. Document Revised: 07/08/2020 Document Reviewed: 07/08/2020 Elsevier Patient Education  [place].

## 2022-06-19 NOTE — Progress Notes (Signed)
Patient ID: Victor Knight, male    DOB: [birth year], 72 y.o.   MRN: [id number]  PCP: Delsa Grana, PA-C  Chief Complaint  Patient presents with   Sinusitis    Green mucus and a lot of facial pressure onset for a week getting worst    Subjective:   Victor Knight is a 72 y.o. male, presents to clinic with CC of the following:  Hx of getting sinus infections about once a year - however none for the past 3-4 years or so.  His wife got sick and over a week ago he also got sick with nasal congestion, discharge, sinus HA, no improvement with OTC combo "cold/flu" meds, and yesterday he started to feel much worse - generally ill, with worse thick nasal discharge this am  Sinusitis This is a recurrent problem. The current episode started in the past 7 days (possibly more than a week of sx). Progression since onset: sx much worse yesterday and today. There has been no fever. Associated symptoms include congestion, headaches and sinus pressure. Pertinent negatives include no coughing, diaphoresis, ear pain, neck pain, shortness of breath, sore throat or swollen glands. Treatments tried: cold and sinus/flu. The treatment provided no relief.      Patient Active Problem List   Diagnosis Date Noted   Primary open angle glaucoma (POAG) of both eyes 06/27/2021   Aortic atherosclerosis ([place]) 04/29/2020   History of pulmonary embolus (PE) 04/29/2020   Coronary artery disease involving native heart without angina pectoris 04/29/2020   Abdominal aortic atherosclerosis ([place]) 10/27/2015   Aortic ectasia, abdominal ([place]) 10/27/2015   Hx of smoking 10/05/2015   History of hepatitis C 10/05/2015   Essential hypertension 10/05/2014   Insomnia 10/05/2014   Hyperlipidemia LDL goal <70 10/05/2014   Gout 10/05/2014      Current Outpatient Medications:    allopurinol (ZYLOPRIM) 100 MG tablet, TAKE 1 TABLET(100 MG) BY MOUTH DAILY, Disp: 90 tablet, Rfl: 1   apixaban (ELIQUIS) 2.5 MG TABS tablet,  Take 1 tablet (2.5 mg total) by mouth 2 (two) times daily., Disp: 180 tablet, Rfl: 1   ezetimibe (ZETIA) 10 MG tablet, Take 1 tablet (10 mg total) by mouth daily., Disp: 90 tablet, Rfl: 3   latanoprost (XALATAN) 0.005 % ophthalmic solution, Place 1 drop into the right eye daily., Disp: , Rfl:    lisinopril (ZESTRIL) 20 MG tablet, Take 1.5 tablets (30 mg total) by mouth daily., Disp: 135 tablet, Rfl: 3   Multiple Vitamin (MULTIVITAMIN) tablet, Take 1 tablet by mouth daily., Disp: , Rfl:    rosuvastatin (CRESTOR) 40 MG tablet, TAKE 1 TABLET(40 MG) BY MOUTH DAILY, Disp: 90 tablet, Rfl: 3   traZODone (DESYREL) 100 MG tablet, Take 0.5-1 tablets (50-100 mg total) by mouth at bedtime as needed. for sleep, Disp: 90 tablet, Rfl: 1   Allergies  Allergen Reactions   Keflex [Cephalexin] Rash     Social History   Tobacco Use   Smoking status: Former   Smokeless tobacco: Never  Vaping Use   Vaping Use: Never used  Substance Use Topics   Alcohol use: Yes    Alcohol/week: 7.0 standard drinks of alcohol    Types: 7 Glasses of wine per week   Drug use: No      Chart Review Today: I personally reviewed active problem list, medication list, allergies, family history, social history, health maintenance, notes from last encounter, lab results, imaging with the patient/caregiver today.   Review of Systems  Constitutional:  Negative.  Negative for activity change, appetite change and diaphoresis.  HENT:  Positive for congestion, rhinorrhea, sinus pressure and sinus pain. Negative for ear pain and sore throat.   Eyes: Negative.   Respiratory: Negative.  Negative for cough and shortness of breath.   Cardiovascular: Negative.   Gastrointestinal: Negative.   Endocrine: Negative.   Genitourinary: Negative.   Musculoskeletal: Negative.  Negative for neck pain.  Skin: Negative.   Allergic/Immunologic: Negative.   Neurological:  Positive for headaches.  Hematological: Negative.    Psychiatric/Behavioral: Negative.    All other systems reviewed and are negative.      Objective:   Vitals:   06/19/22 1437  BP: 130/80  Pulse: 100  Resp: 16  Temp: 97.9 F (36.6 C)  TempSrc: Oral  SpO2: 96%  Weight: 193 lb 12.8 oz (87.9 kg)  Height: '5\' 9"'$  (1.753 m)    Body mass index is 28.62 kg/m.  Physical Exam Vitals and nursing note reviewed.  Constitutional:      General: He is not in acute distress.    Appearance: Normal appearance. He is well-developed. He is not ill-appearing or toxic-appearing.  HENT:     Head: Normocephalic and atraumatic.     Jaw: There is normal jaw occlusion. No trismus.     Right Ear: Tympanic membrane, ear canal and external ear normal. No middle ear effusion. There is no impacted cerumen.     Left Ear: Tympanic membrane, ear canal and external ear normal.  No middle ear effusion. There is no impacted cerumen.     Nose: Mucosal edema, congestion and rhinorrhea present.     Right Turbinates: Swollen.     Left Turbinates: Swollen.     Right Sinus: No maxillary sinus tenderness or frontal sinus tenderness.     Left Sinus: No maxillary sinus tenderness or frontal sinus tenderness.     Mouth/Throat:     Mouth: Mucous membranes are moist.     Pharynx: Oropharynx is clear. Uvula midline. No pharyngeal swelling, oropharyngeal exudate, posterior oropharyngeal erythema or uvula swelling.  Eyes:     General: Lids are normal.     Conjunctiva/sclera: Conjunctivae normal.     Pupils: Pupils are equal, round, and reactive to light.  Neck:     Trachea: Trachea and phonation normal. No tracheal deviation.  Cardiovascular:     Rate and Rhythm: Regular rhythm.     Pulses: Normal pulses.          Radial pulses are 2+ on the right side and 2+ on the left side.       Posterior tibial pulses are 2+ on the right side and 2+ on the left side.     Heart sounds: Normal heart sounds.  Pulmonary:     Effort: Pulmonary effort is normal.     Breath sounds:  Normal breath sounds.  Abdominal:     General: Bowel sounds are normal. There is no distension.     Palpations: Abdomen is soft.     Tenderness: There is no abdominal tenderness. There is no guarding or rebound.  Musculoskeletal:        General: Normal range of motion.     Cervical back: Normal range of motion and neck supple.  Lymphadenopathy:     Cervical: No cervical adenopathy.  Skin:    General: Skin is warm and dry.     Capillary Refill: Capillary refill takes less than 2 seconds.     Findings: No rash.  Neurological:  Mental Status: He is alert and oriented to person, place, and time.     Gait: Gait normal.  Psychiatric:        Speech: Speech normal.        Behavior: Behavior normal.      Results for orders placed or performed in visit on 02/03/22  ECHOCARDIOGRAM COMPLETE  Result Value Ref Range   AR max vel 4.83 cm2   AV Peak grad 7.0 mmHg   Ao pk vel 1.32 m/s   S' Lateral 3.45 cm   Area-P 1/2 3.54 cm2   AV Area VTI 4.43 cm2   AV Mean grad 4.0 mmHg   Single Plane A4C EF 54.0 %   Single Plane A2C EF 53.6 %   Calc EF 53.4 %   AV Area mean vel 4.05 cm2       Assessment & Plan:   1. Rhinosinusitis  Acute URI sx onset ~a week ago, no improvement with OTC meds, hx of sinus infections requiring abx about once a year (none in chart since 2018 and then 2020).  He notes feeling much worse yesterday and today No fever and no significant sinus TTP appreciated on exam, but he does have diffuse nasal mucosal erythema and edema. Encouraged him to try a few more days of more targeted OTC meds like a nightly antihistamine, saline and steroid nasal sprays, sudafed and tylenol (cannot take NSAIDs) and if still no improvement then he can start abx - augmentin sent in D/C sudafed when taking abx  Educated on viral URI/sinusitis and course of viral illnesses - it may still be self limiting and he may not need abx.   He has appt later this week - we will f/up with him  then.  Of note his wife had URI jsut prior to him, she got steroids and did not improve and just got abx called in.  Similar sx.  Unknown if there was any testing to screen for COVID vs other viral infections - she had multiple exposures.      Delsa Grana, PA-C 06/19/22 2:52 PM

## 2022-06-19 NOTE — Progress Notes (Signed)
Labs today for his appt later this week

## 2022-06-20 LAB — LIPID PANEL
Cholesterol: 140 mg/dL (ref <200)
HDL: 60 mg/dL (ref >=40)
LDL Cholesterol (Calc): 59 mg/dL (calc)
Non-HDL Cholesterol (Calc): 80 mg/dL (calc) (ref <130)
Total CHOL/HDL Ratio: 2.3 (calc) (ref <5.0)
Triglycerides: 124 mg/dL (ref <150)

## 2022-06-20 LAB — CBC WITH DIFFERENTIAL/PLATELET
Absolute Monocytes: 765 cells/uL (ref 200–950)
Basophils Absolute: 30 cells/uL (ref 0–200)
Basophils Relative: 0.4 %
Eosinophils Absolute: 68 cells/uL (ref 15–500)
Eosinophils Relative: 0.9 %
HCT: 46.6 % (ref 38.5–50.0)
Hemoglobin: 15.8 g/dL (ref 13.2–17.1)
Lymphs Abs: 2085 cells/uL (ref 850–3900)
MCH: 30.6 pg (ref 27.0–33.0)
MCHC: 33.9 g/dL (ref 32.0–36.0)
MCV: 90.1 fL (ref 80.0–100.0)
MPV: 9.7 fL (ref 7.5–12.5)
Monocytes Relative: 10.2 %
Neutro Abs: 4553 cells/uL (ref 1500–7800)
Neutrophils Relative %: 60.7 %
Platelets: 207 10*3/uL (ref 140–400)
RBC: 5.17 10*6/uL (ref 4.20–5.80)
RDW: 12 % (ref 11.0–15.0)
Total Lymphocyte: 27.8 %
WBC: 7.5 10*3/uL (ref 3.8–10.8)

## 2022-06-20 LAB — COMPLETE METABOLIC PANEL WITH GFR
AG Ratio: 1.5 (calc) (ref 1.0–2.5)
ALT: 22 U/L (ref 9–46)
AST: 23 U/L (ref 10–35)
Albumin: 4.5 g/dL (ref 3.6–5.1)
Alkaline phosphatase (APISO): 48 U/L (ref 35–144)
BUN: 17 mg/dL (ref 7–25)
CO2: 23 mmol/L (ref 20–32)
Calcium: 9.6 mg/dL (ref 8.6–10.3)
Chloride: 103 mmol/L (ref 98–110)
Creat: 1.08 mg/dL (ref 0.70–1.28)
Globulin: 3 g/dL (calc) (ref 1.9–3.7)
Glucose, Bld: 91 mg/dL (ref 65–99)
Potassium: 4.6 mmol/L (ref 3.5–5.3)
Sodium: 139 mmol/L (ref 135–146)
Total Bilirubin: 0.4 mg/dL (ref 0.2–1.2)
Total Protein: 7.5 g/dL (ref 6.1–8.1)
eGFR: 73 mL/min/{1.73_m2} (ref >=60)

## 2022-06-20 LAB — URIC ACID: Uric Acid, Serum: 4.3 mg/dL (ref 4.0–8.0)

## 2022-06-23 ENCOUNTER — Ambulatory Visit: Payer: Medicare PPO | Admitting: Family Medicine

## 2022-06-23 ENCOUNTER — Encounter: Payer: Self-pay | Admitting: Family Medicine

## 2022-06-23 VITALS — BP 124/82 | HR 96 | Temp 97.8°F | Resp 16 | Ht 69.0 in | Wt 195.1 lb

## 2022-06-23 DIAGNOSIS — E785 Hyperlipidemia, unspecified: Secondary | ICD-10-CM

## 2022-06-23 DIAGNOSIS — M1000 Idiopathic gout, unspecified site: Secondary | ICD-10-CM | POA: Diagnosis not present

## 2022-06-23 DIAGNOSIS — G4700 Insomnia, unspecified: Secondary | ICD-10-CM | POA: Diagnosis not present

## 2022-06-23 DIAGNOSIS — I10 Essential (primary) hypertension: Secondary | ICD-10-CM | POA: Diagnosis not present

## 2022-06-23 MED ORDER — TRAZODONE HCL 100 MG PO TABS: 150.0000 mg | ORAL_TABLET | Freq: Every evening | ORAL | 1 refills | Status: DC | PRN

## 2022-06-23 MED ORDER — ALLOPURINOL 100 MG PO TABS: ORAL_TABLET | ORAL | 1 refills | Status: DC

## 2022-06-23 NOTE — Assessment & Plan Note (Signed)
Currently managed on lisinopril 30 mg daily - increased by cardiology Pt reports good med compliance and denies any SE.   Blood pressure today is well controlled. BP Readings from Last 3 Encounters:  06/23/22 124/82  06/19/22 130/80  04/21/22 (!) 136/90   Pt denies CP, SOB, exertional sx, LE edema, palpitation, Ha's, visual disturbances, lightheadedness, hypotension, syncope. Dietary efforts for BP?  Starting to exercise again  AS he starts to work on getting active again he may see BP improve and may be able to return to 20 mg dose He can follow up here with [place] or cardiology who just adjusted management

## 2022-06-23 NOTE — Assessment & Plan Note (Signed)
Not getting as good sleep as he was with trazodone 100 mg He is starting to wake up earlier and not be able to get back to sleep When he started with trazodone it got him 7+ hours of sleep  Will try 150 mg dose with sleep hygiene and he'll let us know if he's getting adequate sleep Restarting to exercise

## 2022-06-23 NOTE — Patient Instructions (Signed)
Please let me know if your sleep is not improving with higher dose trazodone and with sleep hygiene - see below   Sleep Hygiene Tips 1) Get regular. One of the best ways to train your body to sleep well is to go to bed and get up at more or less the same time every day, even on weekends and days off! This regular rhythm will make you feel better and will give your body something to work from. 2) Sleep when sleepy. Only try to sleep when you actually feel tired or sleepy, rather than spending too much time awake in bed. 3) Get up & try again. If you haven't been able to get to sleep after about 20 minutes or more, get up and do something calming or boring until you feel sleepy, then return to bed and try again. Sit quietly on the couch with the lights off (bright light will tell your brain that it is time to wake up), or read something boring like the phone book. Avoid doing anything that is too stimulating or interesting, as this will wake you up even more. 4) Avoid caffeine & nicotine. It is best to avoid consuming any caffeine (in coffee, tea, cola drinks, chocolate, and some medications) or nicotine (cigarettes) for at least 4-6 hours before going to bed. These substances act as stimulants and interfere with the ability to fall asleep 5) Avoid alcohol. It is also best to avoid alcohol for at least 4-6 hours before going to bed. Many people believe that alcohol is relaxing and helps them to get to sleep at first, but it actually interrupts the quality of sleep. 6) Bed is for sleeping. Try not to use your bed for anything other than sleeping and sex, so that your body comes to associate bed with sleep. If you use bed as a place to watch TV, eat, read, work on your laptop, pay bills, and other things, your body will not learn this Connection. 7) No naps. It is best to avoid taking naps during the day, to make sure that you are tired at bedtime. If you can't make it through the  day without a nap, make sure it is for less than an hour and before 3pm. 8) Sleep rituals. You can develop your own rituals of things to remind your body that it is time to sleep - some people find it useful to do relaxing stretches or breathing exercises for 15 minutes before bed each night, or sit calmly with a cup of caffeine-free tea. 9) Bathtime. Having a hot bath 1-2 hours before bedtime can be useful, as it will raise your body temperature, causing you to feel sleepy as your body temperature drops again. Research shows that sleepiness is associated with a drop in body temperature. 10) No clock-watching. Many people who struggle with sleep tend to watch the clock too much. Frequently checking the clock during the night can wake you up (especially if you turn on the light to read the time) and reinforces negative thoughts such as "Oh no, look how late it is, I'll never get to sleep" or "it's so early, I have only slept for 5 hours, this is terrible." 11) Use a sleep diary. This worksheet can be a useful way of making sure you have the right facts about your sleep, rather than making assumptions. Because a diary involves watching the clock (see point 10) it is a good idea to only use it for two weeks to get an idea  of what is going and then perhaps two months down the track to see how you are progressing. 12) Exercise. Regular exercise is a good idea to help with good sleep, but try not to do strenuous exercise in the 4 hours before bedtime. Morning walks are a great way to start the day feeling refreshed! 13) Eat right. A healthy, balanced diet will help you to sleep well, but timing is important. Some people find that a very empty stomach at bedtime is distracting, so it can be useful to have a light snack, but a heavy meal soon before bed can also interrupt sleep. Some people recommend a warm glass of milk, which contains tryptophan, which acts as a natural sleep  inducer. 14) The right space. It is very important that your bed and bedroom are quiet and comfortable for sleeping. A cooler room with enough blankets to stay warm is best, and make sure you have curtains or an eyemask to block out early morning light and earplugs if there is noise outside your room. 15) Keep daytime routine the same. Even if you have a bad night sleep and are tired it is important that you try to keep your daytime activities the same as you had planned. That is, don't avoid activities because you feel tired. This can reinforce the insomnia.

## 2022-06-23 NOTE — Assessment & Plan Note (Signed)
HLD on zetia and crestor 40 - lipids at goal, well tolerated, no SE or concern, good compliance Lab Results  Component Value Date   CHOL 140 06/19/2022   HDL 60 06/19/2022   LDLCALC 59 06/19/2022   TRIG 124 06/19/2022   CHOLHDL 2.3 06/19/2022  Lipids at goal - continue meds

## 2022-06-23 NOTE — Assessment & Plan Note (Signed)
no recent flares, on allopurinol 100 mg daily Uric acid level at goal Lab Results  Component Value Date   LABURIC 4.3 06/19/2022  Continue allopurinol and diet efforts

## 2022-06-23 NOTE — Progress Notes (Signed)
Name: Victor Knight   MRN: [id number]    DOB: Nov 01, 1950   Date:06/23/2022       Progress Note  Chief Complaint  Patient presents with   Follow-up   Hyperlipidemia   Hypertension   Insomnia     Subjective:   Victor Knight is a 72 y.o. male, presents to clinic for routine f/up  Labs done earlier this week and reviewed with pt today   Hypertension:  Currently managed on lisinopril 30 mg daily - increased by cardiology Pt reports good med compliance and denies any SE.   Blood pressure today is well controlled. BP Readings from Last 3 Encounters:  06/23/22 124/82  06/19/22 130/80  04/21/22 (!) 136/90   Pt denies CP, SOB, exertional sx, LE edema, palpitation, Ha's, visual disturbances, lightheadedness, hypotension, syncope. Dietary efforts for BP?  Starting to exercise again    HLD on zetia and crestor 40 - lipids at goal, well tolerated, no SE or concern, good compliance Lab Results  Component Value Date   CHOL 140 06/19/2022   HDL 60 06/19/2022   LDLCALC 59 06/19/2022   TRIG 124 06/19/2022   CHOLHDL 2.3 06/19/2022   Gout - no recent flares, on allopurinol 100 mg daily Uric acid level at goal Lab Results  Component Value Date   LABURIC 4.3 06/19/2022   Insomnia - worse sleep despite trazodone 100 mg qhs, asks for higher dose  Eliquis - on lower dose, no bleeding concerns, no anemia - labs reviewed  Recent sinusitis he feels much better after about 4 d into antibiotics   Current Outpatient Medications:    allopurinol (ZYLOPRIM) 100 MG tablet, TAKE 1 TABLET(100 MG) BY MOUTH DAILY, Disp: 90 tablet, Rfl: 1   amoxicillin-clavulanate (AUGMENTIN) 875-125 MG tablet, Take 1 tablet by mouth 2 (two) times daily for 7 days., Disp: 14 tablet, Rfl: 0   apixaban (ELIQUIS) 2.5 MG TABS tablet, Take 1 tablet (2.5 mg total) by mouth 2 (two) times daily., Disp: 180 tablet, Rfl: 1   ezetimibe (ZETIA) 10 MG tablet, Take 1 tablet (10 mg total) by mouth daily., Disp: 90 tablet,  Rfl: 3   latanoprost (XALATAN) 0.005 % ophthalmic solution, Place 1 drop into the right eye daily., Disp: , Rfl:    lisinopril (ZESTRIL) 20 MG tablet, Take 1.5 tablets (30 mg total) by mouth daily., Disp: 135 tablet, Rfl: 3   Multiple Vitamin (MULTIVITAMIN) tablet, Take 1 tablet by mouth daily., Disp: , Rfl:    rosuvastatin (CRESTOR) 40 MG tablet, TAKE 1 TABLET(40 MG) BY MOUTH DAILY, Disp: 90 tablet, Rfl: 3   traZODone (DESYREL) 100 MG tablet, Take 0.5-1 tablets (50-100 mg total) by mouth at bedtime as needed. for sleep, Disp: 90 tablet, Rfl: 1  Patient Active Problem List   Diagnosis Date Noted   Primary open angle glaucoma (POAG) of both eyes 06/27/2021   Aortic atherosclerosis ([place]) 04/29/2020   History of pulmonary embolus (PE) 04/29/2020   Coronary artery disease involving native heart without angina pectoris 04/29/2020   Abdominal aortic atherosclerosis ([place]) 10/27/2015   Aortic ectasia, abdominal ([place]) 10/27/2015   Hx of smoking 10/05/2015   History of hepatitis C 10/05/2015   Essential hypertension 10/05/2014   Insomnia 10/05/2014   Hyperlipidemia LDL goal <70 10/05/2014   Gout 10/05/2014    Past Surgical History:  Procedure Laterality Date   CARPAL TUNNEL RELEASE     rotator cuff surgery   04/20/2016   right arm; [facility]    TRIGGER FINGER RELEASE  Family History  Problem Relation Age of Onset   Heart attack Father    Hypertension Father    Heart disease Father    Hypertension Sister    Hypertension Brother    Hypertension Brother    Atrial fibrillation Brother    Hypertension Brother    Hypertension Brother    Heart disease Brother    Atrial fibrillation Brother    Supraventricular tachycardia Daughter    Heart disease Daughter     Social History   Tobacco Use   Smoking status: Former   Smokeless tobacco: Never  Scientific laboratory technician Use: Never used  Substance Use Topics   Alcohol use: Yes    Alcohol/week: 7.0 standard drinks of alcohol    Types: 7  Glasses of wine per week   Drug use: No     Allergies  Allergen Reactions   Keflex [Cephalexin] Rash    Health Maintenance  Topic Date Due   COVID-19 Vaccine (5 - 2023-24 season) 07/09/2022 (Originally 12/16/2021)   INFLUENZA VACCINE  07/16/2022 (Originally 11/15/2021)   Zoster Vaccines- Shingrix (1 of 2) 09/23/2022 (Originally 09/04/1969)   Medicare Annual Wellness (AWV)  03/22/2023   COLONOSCOPY (Pts 45-46yr Insurance coverage will need to be confirmed)  04/18/2023   DTaP/Tdap/Td (2 - Td or Tdap) 09/28/2025   Pneumonia Vaccine 72 Years old  Completed   Hepatitis C Screening  Completed   HPV VACCINES  Aged Out    Chart Review Today: I personally reviewed active problem list, medication list, allergies, family history, social history, health maintenance, notes from last encounter, lab results, imaging with the patient/caregiver today.   Review of Systems  Constitutional: Negative.   HENT: Negative.    Eyes: Negative.   Respiratory: Negative.    Cardiovascular: Negative.   Gastrointestinal: Negative.   Endocrine: Negative.   Genitourinary: Negative.   Musculoskeletal: Negative.   Skin: Negative.   Allergic/Immunologic: Negative.   Neurological: Negative.   Hematological: Negative.   Psychiatric/Behavioral: Negative.    All other systems reviewed and are negative.    Objective:   Vitals:   06/23/22 1023  BP: 124/82  Pulse: 96  Resp: 16  Temp: 97.8 F (36.6 C)  TempSrc: Oral  SpO2: 96%  Weight: 195 lb 1.6 oz (88.5 kg)  Height: '5\' 9"'$  (1.753 m)    Body mass index is 28.81 kg/m.  Physical Exam Vitals and nursing note reviewed.  Constitutional:      General: He is not in acute distress.    Appearance: Normal appearance. He is well-developed and well-groomed. He is not ill-appearing, toxic-appearing or diaphoretic.  HENT:     Head: Normocephalic and atraumatic.     Jaw: No trismus.     Right Ear: External ear normal.     Left Ear: External ear normal.   Eyes:     General: Lids are normal. No scleral icterus.       Right eye: No discharge.        Left eye: No discharge.     Conjunctiva/sclera: Conjunctivae normal.  Neck:     Trachea: Trachea and phonation normal. No tracheal deviation.  Cardiovascular:     Rate and Rhythm: Normal rate and regular rhythm.     Pulses: Normal pulses.          Radial pulses are 2+ on the right side and 2+ on the left side.       Posterior tibial pulses are 2+ on the right side and 2+ on  the left side.     Heart sounds: Normal heart sounds. No murmur heard.    No friction rub. No gallop.  Pulmonary:     Effort: Pulmonary effort is normal. No respiratory distress.     Breath sounds: Normal breath sounds. No stridor. No wheezing, rhonchi or rales.  Abdominal:     General: Bowel sounds are normal. There is no distension.     Palpations: Abdomen is soft.  Musculoskeletal:     Right lower leg: No edema.     Left lower leg: No edema.  Skin:    General: Skin is warm and dry.     Coloration: Skin is not jaundiced.     Findings: No rash.     Nails: There is no clubbing.  Neurological:     Mental Status: He is alert. Mental status is at baseline.     Cranial Nerves: No dysarthria or facial asymmetry.     Motor: No tremor or abnormal muscle tone.     Gait: Gait normal.  Psychiatric:        Mood and Affect: Mood normal.        Speech: Speech normal.        Behavior: Behavior normal. Behavior is cooperative.         Results for orders placed or performed in visit on 06/19/22  CBC with Differential/Platelet  Result Value Ref Range   WBC 7.5 3.8 - 10.8 Thousand/uL   RBC 5.17 4.20 - 5.80 Million/uL   Hemoglobin 15.8 13.2 - 17.1 g/dL   HCT 46.6 38.5 - 50.0 %   MCV 90.1 80.0 - 100.0 fL   MCH 30.6 27.0 - 33.0 pg   MCHC 33.9 32.0 - 36.0 g/dL   RDW 12.0 11.0 - 15.0 %   Platelets 207 140 - 400 Thousand/uL   MPV 9.7 7.5 - 12.5 fL   Neutro Abs 4,553 1,500 - 7,800 cells/uL   Lymphs Abs 2,085 850 - 3,900  cells/uL   Absolute Monocytes 765 200 - 950 cells/uL   Eosinophils Absolute 68 15 - 500 cells/uL   Basophils Absolute 30 0 - 200 cells/uL   Neutrophils Relative % 60.7 %   Total Lymphocyte 27.8 %   Monocytes Relative 10.2 %   Eosinophils Relative 0.9 %   Basophils Relative 0.4 %  COMPLETE METABOLIC PANEL WITH GFR  Result Value Ref Range   Glucose, Bld 91 65 - 99 mg/dL   BUN 17 7 - 25 mg/dL   Creat 1.08 0.70 - 1.28 mg/dL   eGFR 73 > OR = 60 mL/min/1.79m   BUN/Creatinine Ratio SEE NOTE: 6 - 22 (calc)   Sodium 139 135 - 146 mmol/L   Potassium 4.6 3.5 - 5.3 mmol/L   Chloride 103 98 - 110 mmol/L   CO2 23 20 - 32 mmol/L   Calcium 9.6 8.6 - 10.3 mg/dL   Total Protein 7.5 6.1 - 8.1 g/dL   Albumin 4.5 3.6 - 5.1 g/dL   Globulin 3.0 1.9 - 3.7 g/dL (calc)   AG Ratio 1.5 1.0 - 2.5 (calc)   Total Bilirubin 0.4 0.2 - 1.2 mg/dL   Alkaline phosphatase (APISO) 48 35 - 144 U/L   AST 23 10 - 35 U/L   ALT 22 9 - 46 U/L  Lipid panel  Result Value Ref Range   Cholesterol 140 <200 mg/dL   HDL 60 > OR = 40 mg/dL   Triglycerides 124 <150 mg/dL   LDL Cholesterol (Calc) 59 mg/dL (calc)  Total CHOL/HDL Ratio 2.3 <5.0 (calc)   Non-HDL Cholesterol (Calc) 80 <130 mg/dL (calc)  Uric acid  Result Value Ref Range   Uric Acid, Serum 4.3 4.0 - 8.0 mg/dL     Assessment & Plan:   Problem List Items Addressed This Visit       Cardiovascular and Mediastinum   Essential hypertension - Primary    Currently managed on lisinopril 30 mg daily - increased by cardiology Pt reports good med compliance and denies any SE.   Blood pressure today is well controlled. BP Readings from Last 3 Encounters:  06/23/22 124/82  06/19/22 130/80  04/21/22 (!) 136/90  Pt denies CP, SOB, exertional sx, LE edema, palpitation, Ha's, visual disturbances, lightheadedness, hypotension, syncope. Dietary efforts for BP?  Starting to exercise again  AS he starts to work on getting active again he may see BP improve and may be  able to return to 20 mg dose He can follow up here with [place] or cardiology who just adjusted management        Other   Insomnia (Chronic)    Not getting as good sleep as he was with trazodone 100 mg He is starting to wake up earlier and not be able to get back to sleep When he started with trazodone it got him 7+ hours of sleep  Will try 150 mg dose with sleep hygiene and he'll let us know if he's getting adequate sleep Restarting to exercise      Relevant Medications   traZODone (DESYREL) 100 MG tablet   Hyperlipidemia LDL goal <70 (Chronic)    HLD on zetia and crestor 40 - lipids at goal, well tolerated, no SE or concern, good compliance Lab Results  Component Value Date   CHOL 140 06/19/2022   HDL 60 06/19/2022   LDLCALC 59 06/19/2022   TRIG 124 06/19/2022   CHOLHDL 2.3 06/19/2022  Lipids at goal - continue meds       Gout    no recent flares, on allopurinol 100 mg daily Uric acid level at goal Lab Results  Component Value Date   LABURIC 4.3 06/19/2022  Continue allopurinol and diet efforts      Relevant Medications   allopurinol (ZYLOPRIM) 100 MG tablet    F/up sooner if no improvement to insomnia or if med SE with increased trazodone - reviewed med dosing and potential SE at higher doses including serotonin syndrome  Return in about 6 months (around 12/24/2022) for Routine follow-up.   Delsa Grana, PA-C 06/23/22 10:30 AM

## 2022-07-13 ENCOUNTER — Ambulatory Visit: Payer: Medicare PPO | Admitting: Dermatology

## 2022-09-25 ENCOUNTER — Other Ambulatory Visit: Payer: Self-pay | Admitting: Family Medicine

## 2022-09-25 DIAGNOSIS — G4700 Insomnia, unspecified: Secondary | ICD-10-CM

## 2022-11-09 ENCOUNTER — Ambulatory Visit: Payer: Medicare PPO | Admitting: Dermatology

## 2022-11-09 VITALS — BP 131/73 | HR 71

## 2022-11-09 DIAGNOSIS — L570 Actinic keratosis: Secondary | ICD-10-CM | POA: Diagnosis not present

## 2022-11-09 DIAGNOSIS — L578 Other skin changes due to chronic exposure to nonionizing radiation: Secondary | ICD-10-CM | POA: Diagnosis not present

## 2022-11-09 DIAGNOSIS — Z1283 Encounter for screening for malignant neoplasm of skin: Secondary | ICD-10-CM | POA: Diagnosis not present

## 2022-11-09 DIAGNOSIS — L814 Other melanin hyperpigmentation: Secondary | ICD-10-CM

## 2022-11-09 DIAGNOSIS — W908XXA Exposure to other nonionizing radiation, initial encounter: Secondary | ICD-10-CM

## 2022-11-09 DIAGNOSIS — D1801 Hemangioma of skin and subcutaneous tissue: Secondary | ICD-10-CM

## 2022-11-09 DIAGNOSIS — D229 Melanocytic nevi, unspecified: Secondary | ICD-10-CM

## 2022-11-09 DIAGNOSIS — L821 Other seborrheic keratosis: Secondary | ICD-10-CM

## 2022-11-09 DIAGNOSIS — Z86018 Personal history of other benign neoplasm: Secondary | ICD-10-CM

## 2022-11-09 NOTE — Progress Notes (Signed)
Follow-Up Visit   Subjective  Victor Knight is a 72 y.o. male who presents for the following: Skin Cancer Screening and Full Body Skin Exam  The patient presents for Total-Body Skin Exam (TBSE) for skin cancer screening and mole check. The patient has spots, moles and lesions to be evaluated, some may be new or changing and the patient may have concern these could be cancer.    The following portions of the chart were reviewed this encounter and updated as appropriate: medications, allergies, medical history  Review of Systems:  No other skin or systemic complaints except as noted in HPI or Assessment and Plan.  Objective  Well appearing patient in no apparent distress; mood and affect are within normal limits.  A full examination was performed including scalp, head, eyes, ears, nose, lips, neck, chest, axillae, abdomen, back, buttocks, bilateral upper extremities, bilateral lower extremities, hands, feet, fingers, toes, fingernails, and toenails. All findings within normal limits unless otherwise noted below.   Relevant physical exam findings are noted in the Assessment and Plan.  Forehead x 1 Erythematous thin papules/macules with gritty scale.     Assessment & Plan   SKIN CANCER SCREENING PERFORMED TODAY.  ACTINIC DAMAGE - Chronic condition, secondary to cumulative UV/sun exposure - diffuse scaly erythematous macules with underlying dyspigmentation - Recommend daily broad spectrum sunscreen SPF 30+ to sun-exposed areas, reapply every 2 hours as needed.  - Staying in the shade or wearing long sleeves, sun glasses (UVA+UVB protection) and wide brim hats (4-inch brim around the entire circumference of the hat) are also recommended for sun protection.  - Call for new or changing lesions.  LENTIGINES, SEBORRHEIC KERATOSES, HEMANGIOMAS - Benign normal skin lesions - Benign-appearing - Call for any changes  MELANOCYTIC NEVI - Tan-brown and/or pink-flesh-colored symmetric  macules and papules - Benign appearing on exam today - Observation - Call clinic for new or changing moles - Recommend daily use of broad spectrum spf 30+ sunscreen to sun-exposed areas.   HISTORY OF DYSPLASTIC NEVUS No evidence of recurrence today Recommend regular full body skin exams Recommend daily broad spectrum sunscreen SPF 30+ to sun-exposed areas, reapply every 2 hours as needed.  Call if any new or changing lesions are noted between office visits  AK (actinic keratosis) Forehead x 1  Actinic keratoses are precancerous spots that appear secondary to cumulative UV radiation exposure/sun exposure over time. They are chronic with expected duration over 1 year. A portion of actinic keratoses will progress to squamous cell carcinoma of the skin. It is not possible to reliably predict which spots will progress to skin cancer and so treatment is recommended to prevent development of skin cancer.  Recommend daily broad spectrum sunscreen SPF 30+ to sun-exposed areas, reapply every 2 hours as needed.  Recommend staying in the shade or wearing long sleeves, sun glasses (UVA+UVB protection) and wide brim hats (4-inch brim around the entire circumference of the hat). Call for new or changing lesions.  Destruction of lesion - Forehead x 1 Complexity: simple   Destruction method: cryotherapy   Informed consent: discussed and consent obtained   Timeout:  patient name, date of birth, surgical site, and procedure verified Lesion destroyed using liquid nitrogen: Yes   Region frozen until ice ball extended beyond lesion: Yes   Outcome: patient tolerated procedure well with no complications   Post-procedure details: wound care instructions given     Return in about 1 year (around 11/09/2023) for TBSE.  Maylene Roes, CMA, am  acting as scribe for Armida Sans, MD .   Documentation: I have reviewed the above documentation for accuracy and completeness, and I agree with the above.  Armida Sans, MD

## 2022-11-09 NOTE — Patient Instructions (Signed)

## 2022-11-13 ENCOUNTER — Encounter: Payer: Self-pay | Admitting: Dermatology

## 2022-12-25 ENCOUNTER — Ambulatory Visit: Payer: Medicare PPO | Admitting: Family Medicine

## 2023-01-02 ENCOUNTER — Ambulatory Visit: Payer: Medicare PPO | Admitting: Family Medicine

## 2023-01-02 ENCOUNTER — Encounter: Payer: Self-pay | Admitting: Family Medicine

## 2023-01-02 VITALS — BP 118/76 | HR 89 | Temp 97.8°F | Resp 16 | Ht 69.0 in | Wt 194.2 lb

## 2023-01-02 DIAGNOSIS — I700 Atherosclerosis of aorta: Secondary | ICD-10-CM

## 2023-01-02 DIAGNOSIS — G4700 Insomnia, unspecified: Secondary | ICD-10-CM | POA: Diagnosis not present

## 2023-01-02 DIAGNOSIS — M1000 Idiopathic gout, unspecified site: Secondary | ICD-10-CM

## 2023-01-02 DIAGNOSIS — E785 Hyperlipidemia, unspecified: Secondary | ICD-10-CM | POA: Diagnosis not present

## 2023-01-02 DIAGNOSIS — Z1211 Encounter for screening for malignant neoplasm of colon: Secondary | ICD-10-CM

## 2023-01-02 DIAGNOSIS — Z7901 Long term (current) use of anticoagulants: Secondary | ICD-10-CM

## 2023-01-02 DIAGNOSIS — I10 Essential (primary) hypertension: Secondary | ICD-10-CM | POA: Diagnosis not present

## 2023-01-02 DIAGNOSIS — Z86711 Personal history of pulmonary embolism: Secondary | ICD-10-CM

## 2023-01-02 MED ORDER — TRAZODONE HCL 100 MG PO TABS
ORAL_TABLET | ORAL | 2 refills | Status: DC
Start: 2023-01-02 — End: 2023-07-11

## 2023-01-02 MED ORDER — LISINOPRIL 20 MG PO TABS: 30.0000 mg | ORAL_TABLET | Freq: Every day | ORAL | 1 refills | Status: DC

## 2023-01-02 MED ORDER — APIXABAN 2.5 MG PO TABS: 2.5000 mg | ORAL_TABLET | Freq: Two times a day (BID) | ORAL | 1 refills | Status: DC

## 2023-01-17 ENCOUNTER — Encounter: Payer: Self-pay | Admitting: *Deleted

## 2023-01-29 ENCOUNTER — Encounter: Payer: Self-pay | Admitting: Family Medicine

## 2023-01-29 NOTE — Progress Notes (Signed)
Name: Victor Knight   MRN: [id number]    DOB: 23-May-1950   Date:01/29/2023       Progress Note  Chief Complaint  Patient presents with   Medical Management of Chronic Issues     Subjective:   Victor Knight is a 72 y.o. male, presents to clinic for f/up on chronic conditions  Gout - no recent flares, on allopurinol 100 mg daily  Hyperlipidemia: Currently treated with crestor and zetia, pt reports good med compliance Last Lipids: Lab Results  Component Value Date   CHOL 140 06/19/2022   HDL 60 06/19/2022   LDLCALC 59 06/19/2022   TRIG 124 06/19/2022   CHOLHDL 2.3 06/19/2022  Labs checked at last OV and well controlled, pt has stayed on crestor and zetia - previously LDL became uncontrolled when he tried to stop zetia - Denies: Chest pain, shortness of breath, myalgias, claudication  HTN on lisinopril 30 mg daily BP Readings from Last 3 Encounters:  01/02/23 118/76  11/09/22 131/73  06/23/22 124/82   Insomnia - trazodone still helps at 150 mg dose  He has started to get more active again, is helping weight, BP and his sleep is better   Current Outpatient Medications:    allopurinol (ZYLOPRIM) 100 MG tablet, TAKE 1 TABLET(100 MG) BY MOUTH DAILY, Disp: 90 tablet, Rfl: 1   ezetimibe (ZETIA) 10 MG tablet, Take 1 tablet (10 mg total) by mouth daily., Disp: 90 tablet, Rfl: 3   latanoprost (XALATAN) 0.005 % ophthalmic solution, Place 1 drop into the right eye daily., Disp: , Rfl:    Multiple Vitamin (MULTIVITAMIN) tablet, Take 1 tablet by mouth daily., Disp: , Rfl:    rosuvastatin (CRESTOR) 40 MG tablet, TAKE 1 TABLET(40 MG) BY MOUTH DAILY, Disp: 90 tablet, Rfl: 3   apixaban (ELIQUIS) 2.5 MG TABS tablet, Take 1 tablet (2.5 mg total) by mouth 2 (two) times daily., Disp: 180 tablet, Rfl: 1   lisinopril (ZESTRIL) 20 MG tablet, Take 1.5 tablets (30 mg total) by mouth daily., Disp: 135 tablet, Rfl: 1   traZODone (DESYREL) 100 MG tablet, TAKE 1 AND 1/2 TABLETS(150 MG) BY  MOUTH AT BEDTIME AS NEEDED FOR SLEEP, Disp: 135 tablet, Rfl: 2  Patient Active Problem List   Diagnosis Date Noted   Primary open angle glaucoma (POAG) of both eyes 06/27/2021   Aortic atherosclerosis (HCC) 04/29/2020   History of pulmonary embolus (PE) 04/29/2020   Coronary artery disease involving native heart without angina pectoris 04/29/2020   Abdominal aortic atherosclerosis (HCC) 10/27/2015   Aortic ectasia, abdominal (HCC) 10/27/2015   Hx of smoking 10/05/2015   History of hepatitis C 10/05/2015   Essential hypertension 10/05/2014   Insomnia 10/05/2014   Hyperlipidemia LDL goal <70 10/05/2014   Gout 10/05/2014    Past Surgical History:  Procedure Laterality Date   CARPAL TUNNEL RELEASE     rotator cuff surgery   04/20/2016   right arm; [facility]    TRIGGER FINGER RELEASE      Family History  Problem Relation Age of Onset   Heart attack Father    Hypertension Father    Heart disease Father    Hypertension Sister    Hypertension Brother    Hypertension Brother    Atrial fibrillation Brother    Hypertension Brother    Hypertension Brother    Heart disease Brother    Atrial fibrillation Brother    Supraventricular tachycardia Daughter    Heart disease Daughter     Social History  Tobacco Use   Smoking status: Former   Smokeless tobacco: Never  Vaping Use   Vaping status: Never Used  Substance Use Topics   Alcohol use: Yes    Alcohol/week: 7.0 standard drinks of alcohol    Types: 7 Glasses of wine per week   Drug use: No     Allergies  Allergen Reactions   Keflex [Cephalexin] Rash    Health Maintenance  Topic Date Due   COVID-19 Vaccine (6 - 2023-24 season) 12/17/2022   Medicare Annual Wellness (AWV)  03/22/2023   Colonoscopy  04/18/2023   Zoster Vaccines- Shingrix (1 of 2) 04/03/2023 (Originally 09/04/1969)   INFLUENZA VACCINE  07/16/2023 (Originally 11/16/2022)   DTaP/Tdap/Td (2 - Td or Tdap) 09/28/2025   Pneumonia Vaccine 34+ Years old  Completed    Hepatitis C Screening  Completed   HPV VACCINES  Aged Out    Chart Review Today: I personally reviewed active problem list, medication list, allergies, family history, social history, health maintenance, notes from last encounter, lab results, imaging with the patient/caregiver today.   Review of Systems  Constitutional: Negative.   HENT: Negative.    Eyes: Negative.   Respiratory: Negative.    Cardiovascular: Negative.   Gastrointestinal: Negative.   Endocrine: Negative.   Genitourinary: Negative.   Musculoskeletal: Negative.   Skin: Negative.   Allergic/Immunologic: Negative.   Neurological: Negative.   Hematological: Negative.   Psychiatric/Behavioral: Negative.    All other systems reviewed and are negative.    Objective:   Vitals:   01/02/23 1044  BP: 118/76  Pulse: 89  Resp: 16  Temp: 97.8 F (36.6 C)  TempSrc: Oral  SpO2: 96%  Weight: 194 lb 3.2 oz (88.1 kg)  Height: 5\' 9"  (1.753 m)    Body mass index is 28.68 kg/m.  Physical Exam Vitals and nursing note reviewed.  Constitutional:      General: He is not in acute distress.    Appearance: Normal appearance. He is well-developed and well-groomed. He is not ill-appearing, toxic-appearing or diaphoretic.  HENT:     Head: Normocephalic and atraumatic.     Jaw: No trismus.     Right Ear: External ear normal.     Left Ear: External ear normal.  Eyes:     General: Lids are normal. No scleral icterus.       Right eye: No discharge.        Left eye: No discharge.     Conjunctiva/sclera: Conjunctivae normal.  Neck:     Trachea: Trachea and phonation normal. No tracheal deviation.  Cardiovascular:     Rate and Rhythm: Normal rate and regular rhythm.     Pulses: Normal pulses.          Radial pulses are 2+ on the right side and 2+ on the left side.       Posterior tibial pulses are 2+ on the right side and 2+ on the left side.     Heart sounds: Normal heart sounds. No murmur heard.    No friction rub.  No gallop.  Pulmonary:     Effort: Pulmonary effort is normal. No respiratory distress.     Breath sounds: Normal breath sounds. No stridor. No wheezing, rhonchi or rales.  Abdominal:     General: Bowel sounds are normal. There is no distension.     Palpations: Abdomen is soft.  Musculoskeletal:     Right lower leg: No edema.     Left lower leg: No edema.  Skin:  General: Skin is warm and dry.     Coloration: Skin is not jaundiced.     Findings: No rash.     Nails: There is no clubbing.  Neurological:     Mental Status: He is alert. Mental status is at baseline.     Cranial Nerves: No dysarthria or facial asymmetry.     Motor: No tremor or abnormal muscle tone.     Gait: Gait normal.  Psychiatric:        Mood and Affect: Mood normal.        Speech: Speech normal.        Behavior: Behavior normal. Behavior is cooperative.         Assessment & Plan:     ICD-10-CM   1. Primary hypertension  I10 lisinopril (ZESTRIL) 20 MG tablet   Stable, well-controlled, blood pressure at goal today, continue lisinopril 20 mg    2. Hyperlipidemia LDL goal <70  E78.5    last lipids at goal on current meds, no change, continue to check labs annually, good compliance, no med SE or concerns    3. Insomnia, unspecified type  G47.00 traZODone (DESYREL) 100 MG tablet   better with trazodone - refills ordered today    4. Idiopathic gout, unspecified chronicity, unspecified site  M10.00    no recent flares or concerns, continue allopurinol 100 mg daily    5. Aortic atherosclerosis (HCC)  I70.0    on statin, stable and monitoring    6. History of pulmonary embolus (PE)  Z86.711 apixaban (ELIQUIS) 2.5 MG TABS tablet   continued anticoagulation with no SE or concerns currently, no SE or sx of bleeding    7. Chronic anticoagulation  Z79.01 apixaban (ELIQUIS) 2.5 MG TABS tablet   no concerns today re his long term anticoagulation    8. Screening for malignant neoplasm of colon  Z12.11  Ambulatory referral to Gastroenterology        Return in about 6 months (around 07/02/2023) for CPE (if covered by insurance/labs etc) or if not routine f/up with labs .   Danelle Berry, PA-C 01/29/23 5:10 PM

## 2023-02-17 ENCOUNTER — Other Ambulatory Visit: Payer: Self-pay | Admitting: Family Medicine

## 2023-02-17 DIAGNOSIS — E785 Hyperlipidemia, unspecified: Secondary | ICD-10-CM

## 2023-02-19 NOTE — Telephone Encounter (Signed)
Requested Prescriptions  Pending Prescriptions Disp Refills   rosuvastatin (CRESTOR) 40 MG tablet [Pharmacy Med Name: ROSUVASTATIN 40MG  TABLETS] 90 tablet 1    Sig: TAKE 1 TABLET(40 MG) BY MOUTH DAILY     Cardiovascular:  Antilipid - Statins 2 Failed - 02/17/2023  3:16 AM      Failed - Lipid Panel in normal range within the last 12 months    Cholesterol  Date Value Ref Range Status  06/19/2022 140 <200 mg/dL Final   LDL Cholesterol (Calc)  Date Value Ref Range Status  06/19/2022 59 mg/dL (calc) Final    Comment:    Reference range: <100 . Desirable range <100 mg/dL for primary prevention;   <70 mg/dL for patients with CHD or diabetic patients  with > or = 2 CHD risk factors. Marland Kitchen LDL-C is now calculated using the Martin-Hopkins  calculation, which is a validated novel method providing  better accuracy than the Friedewald equation in the  estimation of LDL-C.  Horald Pollen et al. Lenox Ahr. 1478;295(62): 2061-2068  (http://education.[facility].com/faq/FAQ164)    HDL  Date Value Ref Range Status  06/19/2022 60 > OR = 40 mg/dL Final   Triglycerides  Date Value Ref Range Status  06/19/2022 124 <150 mg/dL Final         Passed - Cr in normal range and within 360 days    Creat  Date Value Ref Range Status  06/19/2022 1.08 0.70 - 1.28 mg/dL Final         Passed - Patient is not pregnant      Passed - Valid encounter within last 12 months    Recent Outpatient Visits           1 month ago Primary hypertension   [facility] Danelle Berry, PA-C   8 months ago Essential hypertension   [facility] Danelle Berry, PA-C   8 months ago Rhinosinusitis   [facility] Danelle Berry, PA-C   11 months ago Insomnia, unspecified type   [facility] Danelle Berry, PA-C   1 year ago Encounter for examination following treatment at hospital   [facility] Danelle Berry, [place]       Future Appointments             In 1 month  [facility]   In 4 months Danelle Berry, PA-C [facility]   In 9 months Deirdre Evener, MD [facility]

## 2023-02-20 ENCOUNTER — Other Ambulatory Visit: Payer: Self-pay

## 2023-02-20 DIAGNOSIS — M1000 Idiopathic gout, unspecified site: Secondary | ICD-10-CM

## 2023-02-20 MED ORDER — ALLOPURINOL 100 MG PO TABS: ORAL_TABLET | ORAL | 1 refills | Status: DC

## 2023-03-27 ENCOUNTER — Ambulatory Visit: Payer: Medicare PPO

## 2023-04-21 ENCOUNTER — Other Ambulatory Visit: Payer: Self-pay | Admitting: Family Medicine

## 2023-04-21 DIAGNOSIS — E785 Hyperlipidemia, unspecified: Secondary | ICD-10-CM

## 2023-04-26 ENCOUNTER — Telehealth: Payer: Self-pay

## 2023-04-26 ENCOUNTER — Ambulatory Visit (INDEPENDENT_AMBULATORY_CARE_PROVIDER_SITE_OTHER): Payer: Medicare PPO

## 2023-04-26 VITALS — BP 128/70 | Ht 69.0 in | Wt 198.3 lb

## 2023-04-26 DIAGNOSIS — Z0000 Encounter for general adult medical examination without abnormal findings: Secondary | ICD-10-CM | POA: Diagnosis not present

## 2023-04-26 DIAGNOSIS — Z1211 Encounter for screening for malignant neoplasm of colon: Secondary | ICD-10-CM

## 2023-04-26 NOTE — Telephone Encounter (Signed)
 The patient called in and left a voicemail requesting to schedule his colonoscopy with Dr. Servando Snare. I called his letting her know we received her message, and I sent the message to the nurse.

## 2023-04-26 NOTE — Progress Notes (Signed)
 Subjective:   Victor Knight is a 73 y.o. male who presents for Medicare Annual/Subsequent preventive examination.  Visit Complete: In person   Cardiac Risk Factors include: advanced age (>26men, >87 women);dyslipidemia;male gender;hypertension     Objective:    Today's Vitals   04/26/23 1301  BP: 128/70  Weight: 198 lb 4.8 oz (89.9 kg)  Height: 5' 9 (1.753 m)   Body mass index is 29.28 kg/m.     04/26/2023    1:10 PM 11/08/2021    9:43 AM 03/15/2021    1:41 PM 03/09/2020    1:45 PM 06/17/2019    2:18 PM 05/16/2019    3:20 PM 04/28/2019    2:29 AM  Advanced Directives  Does Patient Have a Medical Advance Directive? Yes Yes Yes Yes Yes Yes Yes  Type of Estate Agent of [place];Living will Healthcare Power of [place];Living will Healthcare Power of [place];Living will Healthcare Power of [place];Living will Healthcare Power of [place];Living will Healthcare Power of [place];Living will Healthcare Power of [place];Living will  Does patient want to make changes to medical advance directive? No - Patient declined    No - Patient declined No - Patient declined   Copy of Healthcare Power of Attorney in Chart? Yes - validated most recent copy scanned in chart (See row information)  Yes - validated most recent copy scanned in chart (See row information) Yes - validated most recent copy scanned in chart (See row information) No - copy requested No - copy requested     Current Medications (verified) Outpatient Encounter Medications as of 04/26/2023  Medication Sig   allopurinol  (ZYLOPRIM ) 100 MG tablet TAKE 1 TABLET(100 MG) BY MOUTH DAILY   apixaban  (ELIQUIS ) 2.5 MG TABS tablet Take 1 tablet (2.5 mg total) by mouth 2 (two) times daily.   ezetimibe  (ZETIA ) 10 MG tablet TAKE 1 TABLET(10 MG) BY MOUTH DAILY   latanoprost (XALATAN) 0.005 % ophthalmic solution Place 1 drop into the right eye daily.   lisinopril  (ZESTRIL ) 20 MG tablet Take 1.5 tablets (30 mg total)  by mouth daily.   Multiple Vitamin (MULTIVITAMIN) tablet Take 1 tablet by mouth daily.   rosuvastatin  (CRESTOR ) 40 MG tablet TAKE 1 TABLET(40 MG) BY MOUTH DAILY   traZODone  (DESYREL ) 100 MG tablet TAKE 1 AND 1/2 TABLETS(150 MG) BY MOUTH AT BEDTIME AS NEEDED FOR SLEEP   No facility-administered encounter medications on file as of 04/26/2023.    Allergies (verified) Keflex [cephalexin]   History: Past Medical History:  Diagnosis Date   Abdominal aortic atherosclerosis (HCC) 10/27/2015   Adhesive capsulitis of shoulder 04/09/2017   Aortic ectasia, abdominal (HCC) 10/27/2015   Bilateral pulmonary embolism (HCC) 04/28/2019   Bursitis of shoulder 04/09/2017   Complete tear of right rotator cuff 04/20/2016   Dupuytren contracture 04/09/2017   Dysplastic nevus 07/13/2014   L epigastric - moder to severe, excision 08/25/2014   Dysplastic nevus 11/03/2015   R mid back 5.0 cm lat to spine - moderate   Gout    Hyperlipidemia    Hypertension    Insomnia    Plantar fasciitis of left foot 03/31/2019   Tendinitis of shoulder 04/09/2017   Past Surgical History:  Procedure Laterality Date   CARPAL TUNNEL RELEASE     rotator cuff surgery   04/20/2016   right arm; [facility]    TRIGGER FINGER RELEASE     Family History  Problem Relation Age of Onset   Heart attack Father    Hypertension Father  Heart disease Father    Hypertension Sister    Hypertension Brother    Hypertension Brother    Atrial fibrillation Brother    Hypertension Brother    Hypertension Brother    Heart disease Brother    Atrial fibrillation Brother    Supraventricular tachycardia Daughter    Heart disease Daughter    Social History   Socioeconomic History   Marital status: Married    Spouse name: [name]   Number of children: 1   Years of education: Not on file   Highest education level: 12th grade  Occupational History   Occupation: Retired  Tobacco Use   Smoking status: Former   Smokeless tobacco: Never   Advertising Account Planner   Vaping status: Never Used  Substance and Sexual Activity   Alcohol use: Yes    Alcohol/week: 7.0 standard drinks of alcohol    Types: 7 Glasses of wine per week   Drug use: No   Sexual activity: Yes    Partners: Female  Other Topics Concern   Not on file  Social History Narrative   Not on file   Social Drivers of Health   Financial Resource Strain: Low Risk  (04/26/2023)   Overall Financial Resource Strain (CARDIA)    Difficulty of Paying Living Expenses: Not hard at all  Food Insecurity: No Food Insecurity (04/26/2023)   Hunger Vital Sign    Worried About Running Out of Food in the Last Year: Never true    Ran Out of Food in the Last Year: Never true  Transportation Needs: No Transportation Needs (04/26/2023)   PRAPARE - Administrator, Civil Service (Medical): No    Lack of Transportation (Non-Medical): No  Physical Activity: Insufficiently Active (04/26/2023)   Exercise Vital Sign    Days of Exercise per Week: 2 days    Minutes of Exercise per Session: 60 min  Stress: No Stress Concern Present (04/26/2023)   [employer] of Occupational Health - Occupational Stress Questionnaire    Feeling of Stress : Not at all  Social Connections: Socially Integrated (04/26/2023)   Social Connection and Isolation Panel [NHANES]    Frequency of Communication with Friends and Family: Three times a week    Frequency of Social Gatherings with Friends and Family: Three times a week    Attends Religious Services: More than 4 times per year    Active Member of Clubs or Organizations: Yes    Attends Engineer, Structural: More than 4 times per year    Marital Status: Married    Tobacco Counseling Counseling given: Not Answered   Clinical Intake:  Pre-visit preparation completed: Yes  Pain : No/denies pain     BMI - recorded: 29.28 Nutritional Status: BMI 25 -29 Overweight Nutritional Risks: None Diabetes: No  How often do you need to have someone  help you when you read instructions, pamphlets, or other written materials from your doctor or pharmacy?: 1 - Never  Interpreter Needed?: No  Information entered by :: JHONNIE DAS, LPN   Activities of Daily Living    04/26/2023    1:11 PM 01/02/2023   10:44 AM  In your present state of health, do you have any difficulty performing the following activities:  Hearing? 0 0  Vision? 0 0  Difficulty concentrating or making decisions? 0 0  Walking or climbing stairs? 0 0  Dressing or bathing? 0 0  Doing errands, shopping? 0 0  Preparing Food and eating ? N  Using the Toilet? N   In the past six months, have you accidently leaked urine? N   Do you have problems with loss of bowel control? N   Managing your Medications? N   Managing your Finances? N   Housekeeping or managing your Housekeeping? N     Patient Care Team: Leavy Mole, PA-C as PCP - General (Family Medicine) Darliss Rogue, MD as PCP - Cardiology (Cardiology) Hester Alm BROCKS, MD (Dermatology) Dingeldein, Elspeth, MD (Ophthalmology) Sandria Selma LABOR, [facility] (Inactive) as Pharmacist (Pharmacist)  Indicate any recent Medical Services you may have received from other than [facility] providers in the past year (date may be approximate).     Assessment:   This is a routine wellness examination for [place].  Hearing/Vision screen Hearing Screening - Comments:: NO AIDS Vision Screening - Comments:: WEARS GLASSES ALL THE TIME- DR.[email or web address]   Goals Addressed             This Visit's Progress    DIET - EAT MORE FRUITS AND VEGETABLES         Depression Screen    04/26/2023    1:08 PM 01/02/2023   10:44 AM 06/23/2022   10:16 AM 06/19/2022    2:36 PM 03/21/2022    1:32 PM 10/24/2021    1:48 PM 06/27/2021    9:17 AM  PHQ 2/9 Scores  PHQ - 2 Score 0 0 0 0 0 0 0  PHQ- 9 Score 0 0 0 0 0 0 0    Fall Risk    04/26/2023    1:11 PM 01/02/2023   10:44 AM 06/23/2022   10:15 AM 06/19/2022    2:36 PM 03/21/2022    1:32 PM  Fall  Risk   Falls in the past year? 0 0 0 0 0  Number falls in past yr: 0 0 0 0 0  Injury with Fall? 0 0 0 0 0  Risk for fall due to : No Fall Risks No Fall Risks No Fall Risks No Fall Risks No Fall Risks  Follow up Falls prevention discussed;Falls evaluation completed Falls prevention discussed;Education provided;Falls evaluation completed Falls prevention discussed;Education provided;Falls evaluation completed Falls prevention discussed;Education provided;Falls evaluation completed Falls prevention discussed;Education provided;Falls evaluation completed    MEDICARE RISK AT HOME: Medicare Risk at Home Any stairs in or around the home?: Yes If so, are there any without handrails?: No Home free of loose throw rugs in walkways, pet beds, electrical cords, etc?: Yes Adequate lighting in your home to reduce risk of falls?: Yes Life alert?: No Use of a cane, walker or w/c?: No Grab bars in the bathroom?: No Shower chair or bench in shower?: Yes Elevated toilet seat or a handicapped toilet?: Yes  TIMED UP AND GO:  Was the test performed?  Yes  Length of time to ambulate 10 feet: 4 sec Gait steady and fast without use of assistive device    Cognitive Function:        04/26/2023    1:12 PM 07/03/2017   11:50 AM  6CIT Screen  What Year? 0 points 0 points  What month? 0 points 0 points  What time? 0 points 0 points  Count back from 20 0 points 0 points  Months in reverse 0 points 0 points  Repeat phrase 0 points 0 points  Total Score 0 points 0 points    Immunizations Immunization History  Administered Date(s) Administered   Fluad Quad(high Dose 65+) 01/23/2019, 01/07/2020   Influenza, High Dose Seasonal PF  01/08/2017, 02/02/2018, 12/16/2020   Influenza-Unspecified 12/17/2015, 12/24/2021, 12/26/2022   Moderna Covid-19 Vaccine Bivalent Booster 54yrs & up 07/17/2020, 12/16/2020   Moderna Sars-Covid-2 Vaccination 05/15/2019, 06/12/2019, 02/09/2020   Pneumococcal Conjugate-13 10/05/2015,  01/08/2017   Pneumococcal Polysaccharide-23 02/28/2019   Rsv, Bivalent, Protein Subunit Rsvpref,pf [place]) 12/24/2021   Tdap 09/29/2015    TDAP status: Up to date  Flu Vaccine status: Up to date  Pneumococcal vaccine status: Up to date  Covid-19 vaccine status: Completed vaccines  Qualifies for Shingles Vaccine? Yes   Zostavax completed No   Shingrix Completed?: No.    Education has been provided regarding the importance of this vaccine. Patient has been advised to call insurance company to determine out of pocket expense if they have not yet received this vaccine. Advised may also receive vaccine at local pharmacy or Health Dept. Verbalized acceptance and understanding.  Screening Tests Health Maintenance  Topic Date Due   Zoster Vaccines- Shingrix (1 of 2) Never done   COVID-19 Vaccine (6 - 2024-25 season) 12/17/2022   Colonoscopy  04/18/2023   Medicare Annual Wellness (AWV)  04/25/2024   DTaP/Tdap/Td (2 - Td or Tdap) 09/28/2025   Pneumonia Vaccine 34+ Years old  Completed   INFLUENZA VACCINE  Completed   Hepatitis C Screening  Completed   HPV VACCINES  Aged Out    Health Maintenance  Health Maintenance Due  Topic Date Due   Zoster Vaccines- Shingrix (1 of 2) Never done   COVID-19 Vaccine (6 - 2024-25 season) 12/17/2022   Colonoscopy  04/18/2023    Colorectal cancer screening: Referral to GI placed 04/26/23. Pt aware the office will call re: appt.  Lung Cancer Screening: (Low Dose CT Chest recommended if Age 51-80 years, 20 pack-year currently smoking OR have quit w/in 15years.) does not qualify.    Additional Screening:  Hepatitis C Screening: does qualify; Completed 04/25/17  Vision Screening: Recommended annual ophthalmology exams for early detection of glaucoma and other disorders of the eye. Is the patient up to date with their annual eye exam?  Yes  Who is the provider or what is the name of the office in which the patient attends annual eye exams?  DR.[email or web address] If pt is not established with a provider, would they like to be referred to a provider to establish care? No .   Dental Screening: Recommended annual dental exams for proper oral hygiene   Community Resource Referral / Chronic Care Management: CRR required this visit?  No   CCM required this visit?  No     Plan:     I have personally reviewed and noted the following in the patient's chart:   Medical and social history Use of alcohol, tobacco or illicit drugs  Current medications and supplements including opioid prescriptions. Patient is not currently taking opioid prescriptions. Functional ability and status Nutritional status Physical activity Advanced directives List of other physicians Hospitalizations, surgeries, and ER visits in previous 12 months Vitals Screenings to include cognitive, depression, and falls Referrals and appointments  In addition, I have reviewed and discussed with patient certain preventive protocols, quality metrics, and best practice recommendations. A written personalized care plan for preventive services as well as general preventive health recommendations were provided to patient.     Jhonnie GORMAN Das, LPN   [id number]/[id number]   After Visit Summary: (In Person-Declined) Patient declined AVS at this time.  Nurse Notes: REFERRAL FOR COLONOSCOPY SENT

## 2023-04-26 NOTE — Patient Instructions (Addendum)
 Victor Knight , Thank you for taking time to come for your Medicare Wellness Visit. I appreciate your ongoing commitment to your health goals. Please review the following plan we discussed and let me know if I can assist you in the future.   Referrals/Orders/Follow-Ups/Clinician Recommendations: REFERRAL SENT FOR COLONOSCOPY  This is a list of the screening recommended for you and due dates:  Health Maintenance  Topic Date Due   Zoster (Shingles) Vaccine (1 of 2) Never done   COVID-19 Vaccine (6 - 2024-25 season) 12/17/2022   Colon Cancer Screening  04/18/2023   Medicare Annual Wellness Visit  04/25/2024   DTaP/Tdap/Td vaccine (2 - Td or Tdap) 09/28/2025   Pneumonia Vaccine  Completed   Flu Shot  Completed   Hepatitis C Screening  Completed   HPV Vaccine  Aged Out    Advanced directives: (In Chart) A copy of your advanced directives are scanned into your chart should your provider ever need it.  Next Medicare Annual Wellness Visit scheduled for next year: Yes   05/08/24 @ 1:10 PM IN PERSON

## 2023-04-27 ENCOUNTER — Other Ambulatory Visit: Payer: Self-pay

## 2023-04-27 ENCOUNTER — Telehealth: Payer: Self-pay

## 2023-04-27 DIAGNOSIS — Z1211 Encounter for screening for malignant neoplasm of colon: Secondary | ICD-10-CM

## 2023-04-27 MED ORDER — NA SULFATE-K SULFATE-MG SULF 17.5-3.13-1.6 GM/177ML PO SOLN: 1.0000 | Freq: Once | ORAL | 0 refills | Status: AC

## 2023-04-27 NOTE — Telephone Encounter (Signed)
 Gastroenterology Pre-Procedure Review  Request Date: 05/29/23 Requesting Physician: Dr. Jinny  PATIENT REVIEW QUESTIONS: The patient responded to the following health history questions as indicated:    1. Are you having any GI issues? no 2. Do you have a personal history of Polyps? no 3. Do you have a family history of Colon Cancer or Polyps? no 4. Diabetes Mellitus? no 5. Joint replacements in the past 12 months?no 6. Major health problems in the past 3 months?no 7. Any artificial heart valves, MVP, or defibrillator?no    MEDICATIONS & ALLERGIES:    Patient reports the following regarding taking any anticoagulation/antiplatelet therapy:   Plavix, Coumadin, Eliquis , Xarelto, Lovenox, Pradaxa, Brilinta, or Effient? yes (El;iquis advice requested from PCP) Aspirin ? no  Patient confirms/reports the following medications:  Current Outpatient Medications  Medication Sig Dispense Refill   allopurinol  (ZYLOPRIM ) 100 MG tablet TAKE 1 TABLET(100 MG) BY MOUTH DAILY 90 tablet 1   apixaban  (ELIQUIS ) 2.5 MG TABS tablet Take 1 tablet (2.5 mg total) by mouth 2 (two) times daily. 180 tablet 1   ezetimibe  (ZETIA ) 10 MG tablet TAKE 1 TABLET(10 MG) BY MOUTH DAILY 90 tablet 0   latanoprost (XALATAN) 0.005 % ophthalmic solution Place 1 drop into the right eye daily.     lisinopril  (ZESTRIL ) 20 MG tablet Take 1.5 tablets (30 mg total) by mouth daily. 135 tablet 1   Multiple Vitamin (MULTIVITAMIN) tablet Take 1 tablet by mouth daily.     rosuvastatin  (CRESTOR ) 40 MG tablet TAKE 1 TABLET(40 MG) BY MOUTH DAILY 90 tablet 1   traZODone  (DESYREL ) 100 MG tablet TAKE 1 AND 1/2 TABLETS(150 MG) BY MOUTH AT BEDTIME AS NEEDED FOR SLEEP 135 tablet 2   No current facility-administered medications for this visit.    Patient confirms/reports the following allergies:  Allergies  Allergen Reactions   Keflex [Cephalexin] Rash    No orders of the defined types were placed in this encounter.   AUTHORIZATION  INFORMATION Primary Insurance: 1D#: Group #:  Secondary Insurance: 1D#: Group #:  SCHEDULE INFORMATION: Date: 05/29/23 Time: Location: [facility]

## 2023-05-07 ENCOUNTER — Encounter: Payer: Self-pay | Admitting: Cardiology

## 2023-05-07 ENCOUNTER — Ambulatory Visit: Payer: Medicare PPO | Attending: Cardiology | Admitting: Cardiology

## 2023-05-07 VITALS — BP 126/82 | HR 69 | Ht 69.0 in | Wt 198.0 lb

## 2023-05-07 DIAGNOSIS — I77810 Thoracic aortic ectasia: Secondary | ICD-10-CM | POA: Diagnosis not present

## 2023-05-07 DIAGNOSIS — I10 Essential (primary) hypertension: Secondary | ICD-10-CM | POA: Diagnosis not present

## 2023-05-07 DIAGNOSIS — I2510 Atherosclerotic heart disease of native coronary artery without angina pectoris: Secondary | ICD-10-CM

## 2023-05-07 NOTE — Patient Instructions (Signed)

## 2023-05-07 NOTE — Progress Notes (Signed)
Cardiology Office Note:    Date:  05/07/2023   ID:  Victor Knight, DOB 01-09-1951, MRN [id number]  PCP:  Victor Berry, PA-C   [facility] Providers Cardiologist:  Victor Odea, MD     Referring MD: Victor Berry, PA-C   Chief Complaint  Patient presents with   Follow-up    Patient denies new or acute cardiac problems/concerns today.      History of Present Illness:    Victor Knight is a 73 y.o. male with a hx of CAD (LAD and RCA calcifications on chest CT ), hypertension, hyperlipidemia, PE on Eliquis presenting for follow-up.   Patient denies chest pain or shortness of breath.  Compliant with medications as prescribed.  Feels well, has no concerns at this time.  Denies palpitations.  2 brothers have history of atrial fibrillation.  .  Prior notes CT chest 11/2021 showed LAD, RCA calcifications.    Past Medical History:  Diagnosis Date   Abdominal aortic atherosclerosis (HCC) 10/27/2015   Adhesive capsulitis of shoulder 04/09/2017   Aortic ectasia, abdominal (HCC) 10/27/2015   Bilateral pulmonary embolism (HCC) 04/28/2019   Bursitis of shoulder 04/09/2017   Complete tear of right rotator cuff 04/20/2016   Dupuytren contracture 04/09/2017   Dysplastic nevus 07/13/2014   L epigastric - moder to severe, excision 08/25/2014   Dysplastic nevus 11/03/2015   R mid back 5.0 cm lat to spine - moderate   Gout    Hyperlipidemia    Hypertension    Insomnia    Plantar fasciitis of left foot 03/31/2019   Tendinitis of shoulder 04/09/2017    Past Surgical History:  Procedure Laterality Date   CARPAL TUNNEL RELEASE     rotator cuff surgery   04/20/2016   right arm; [facility]    TRIGGER FINGER RELEASE      Current Medications: Current Meds  Medication Sig   allopurinol (ZYLOPRIM) 100 MG tablet TAKE 1 TABLET(100 MG) BY MOUTH DAILY   apixaban (ELIQUIS) 2.5 MG TABS tablet Take 1 tablet (2.5 mg total) by mouth 2 (two) times daily.   ezetimibe (ZETIA) 10 MG tablet  TAKE 1 TABLET(10 MG) BY MOUTH DAILY   latanoprost (XALATAN) 0.005 % ophthalmic solution Place 1 drop into the right eye daily.   lisinopril (ZESTRIL) 20 MG tablet Take 1.5 tablets (30 mg total) by mouth daily.   Multiple Vitamin (MULTIVITAMIN) tablet Take 1 tablet by mouth daily.   rosuvastatin (CRESTOR) 40 MG tablet TAKE 1 TABLET(40 MG) BY MOUTH DAILY   traZODone (DESYREL) 100 MG tablet TAKE 1 AND 1/2 TABLETS(150 MG) BY MOUTH AT BEDTIME AS NEEDED FOR SLEEP     Allergies:   Keflex [cephalexin]   Social History   Socioeconomic History   Marital status: Married    Spouse name: Victor Knight   Number of children: 1   Years of education: Not on file   Highest education level: 12th grade  Occupational History   Occupation: Retired  Tobacco Use   Smoking status: Former   Smokeless tobacco: Never  Advertising account planner   Vaping status: Never Used  Substance and Sexual Activity   Alcohol use: Yes    Alcohol/week: 7.0 standard drinks of alcohol    Types: 7 Glasses of wine per week   Drug use: No   Sexual activity: Yes    Partners: Female  Other Topics Concern   Not on file  Social History Narrative   Not on file   Social Drivers of Health  Financial Resource Strain: Low Risk  (04/26/2023)   Overall Financial Resource Strain (CARDIA)    Difficulty of Paying Living Expenses: Not hard at all  Food Insecurity: No Food Insecurity (04/26/2023)   Hunger Vital Sign    Worried About Running Out of Food in the Last Year: Never true    Ran Out of Food in the Last Year: Never true  Transportation Needs: No Transportation Needs (04/26/2023)   PRAPARE - Administrator, Civil Service (Medical): No    Lack of Transportation (Non-Medical): No  Physical Activity: Insufficiently Active (04/26/2023)   Exercise Vital Sign    Days of Exercise per Week: 2 days    Minutes of Exercise per Session: 60 min  Stress: No Stress Concern Present (04/26/2023)   [employer] of Occupational Health - Occupational  Stress Questionnaire    Feeling of Stress : Not at all  Social Connections: Socially Integrated (04/26/2023)   Social Connection and Isolation Panel [NHANES]    Frequency of Communication with Friends and Family: Three times a week    Frequency of Social Gatherings with Friends and Family: Three times a week    Attends Religious Services: More than 4 times per year    Active Member of Clubs or Organizations: Yes    Attends Engineer, structural: More than 4 times per year    Marital Status: Married     Family History: The patient's family history includes Atrial fibrillation in his brother and brother; Heart attack in his father; Heart disease in his brother, daughter, and father; Hypertension in his brother, brother, brother, brother, father, and sister; Supraventricular tachycardia in his daughter.  ROS:   Please see the history of present illness.     All other systems reviewed and are negative.  EKGs/Labs/Other Studies Reviewed:    The following studies were reviewed today:   EKG Interpretation Date/Time:  Monday May 07 2023 09:25:19 EST Ventricular Rate:  69 PR Interval:  170 QRS Duration:  86 QT Interval:  370 QTC Calculation: 396 R Axis:   22  Text Interpretation: Normal sinus rhythm Normal ECG Confirmed by Victor Knight ([id number]) on 05/07/2023 9:56:25 AM    Recent Labs: 06/19/2022: ALT 22; BUN 17; Creat 1.08; Hemoglobin 15.8; Platelets 207; Potassium 4.6; Sodium 139  Recent Lipid Panel    Component Value Date/Time   CHOL 140 06/19/2022 1507   TRIG 124 06/19/2022 1507   HDL 60 06/19/2022 1507   CHOLHDL 2.3 06/19/2022 1507   VLDL 24 08/11/2016 1013   LDLCALC 59 06/19/2022 1507     Risk Assessment/Calculations:             Physical Exam:    VS:  BP 126/82 (BP Location: Left Arm, Patient Position: Sitting, Cuff Size: Large)   Pulse 69   Ht 5\' 9"  (1.753 m)   Wt 198 lb (89.8 kg)   SpO2 95%   BMI 29.24 kg/m     Wt Readings from Last 3  Encounters:  05/07/23 198 lb (89.8 kg)  04/26/23 198 lb 4.8 oz (89.9 kg)  01/02/23 194 lb 3.2 oz (88.1 kg)     GEN:  Well nourished, well developed in no acute distress HEENT: Normal NECK: No JVD; No carotid bruits CARDIAC: RRR, no murmurs, rubs, gallops RESPIRATORY:  Clear to auscultation without rales, wheezing or rhonchi  ABDOMEN: Soft, non-tender, non-distended MUSCULOSKELETAL:  No edema; No deformity  SKIN: Warm and dry NEUROLOGIC:  Alert and oriented x 3 PSYCHIATRIC:  Normal  affect   ASSESSMENT:    1. Coronary artery disease involving native coronary artery of native heart without angina pectoris   2. Primary hypertension   3. Aortic root dilatation (HCC)    PLAN:    In order of problems listed above:  CAD, LAD and RCA calcifications.  Denies chest pain.  Echo 10/23 EF 60 to 65%.  LDL at goal.  Continue Crestor 40 mg daily, Zetia 10 mg daily.  On Eliquis due to PE. Hypertension, BP controlled.  Continue lisinopril 30 mg daily. Mild aortic root dilatation.  Monitor serially with echoes.  Follow-up in 12 months.     Medication Adjustments/Labs and Tests Ordered: Current medicines are reviewed at length with the patient today.  Concerns regarding medicines are outlined above.  Orders Placed This Encounter  Procedures   EKG 12-Lead   No orders of the defined types were placed in this encounter.   Patient Instructions  Medication Instructions:   Your physician recommends that you continue on your current medications as directed. Please refer to the Current Medication list given to you today.  *If you need a refill on your cardiac medications before your next appointment, please call your pharmacy*   Lab Work:  None Ordered  If you have labs (blood work) drawn today and your tests are completely normal, you will receive your results only by: MyChart Message (if you have MyChart) OR A paper copy in the mail If you have any lab test that is abnormal or we need  to change your treatment, we will call you to review the results.   Testing/Procedures:  None Ordered    Follow-Up: At [facility], you and your health needs are our priority.  As part of our continuing mission to provide you with exceptional heart care, we have created designated Provider Care Teams.  These Care Teams include your primary Cardiologist (physician) and Advanced Practice Providers (APPs -  Physician Assistants and Nurse Practitioners) who all work together to provide you with the care you need, when you need it.  We recommend signing up for the patient portal called "MyChart".  Sign up information is provided on this After Visit Summary.  MyChart is used to connect with patients for Virtual Visits (Telemedicine).  Patients are able to view lab/test results, encounter notes, upcoming appointments, etc.  Non-urgent messages can be sent to your provider as well.   To learn more about what you can do with MyChart, go to [email or web address].    Your next appointment:   12 month(s)  Provider:   You may see Victor Odea, MD or one of the following Advanced Practice Providers on your designated Care Team:   Nicolasa Ducking, NP Eula Listen, PA-C Cadence Fransico Michael, PA-C Charlsie Quest, NP Carlos Levering, NP   Signed, Victor Odea, MD  05/07/2023 10:24 AM    [facility]

## 2023-05-22 ENCOUNTER — Telehealth: Payer: Self-pay

## 2023-05-22 ENCOUNTER — Encounter: Payer: Self-pay | Admitting: Gastroenterology

## 2023-05-22 ENCOUNTER — Telehealth: Payer: Self-pay | Admitting: Family Medicine

## 2023-05-22 ENCOUNTER — Telehealth: Payer: Self-pay | Admitting: *Deleted

## 2023-05-22 NOTE — Telephone Encounter (Signed)
 Victor Knight is calling in because she sent over a blood thinner request form on 04/27/23 and hasn't heard anything back. Victor is requesting Victor Knight fill out the form and send it back. Pt has a colonoscopy scheduled on 05/29/23 and they need to know how long before his appointment should he discontinue the Eliquis .

## 2023-05-22 NOTE — Telephone Encounter (Signed)
 Message received from [place] in Endo, I spoke with Thom Level, he has a colonoscopy w Dr Jinny on 2/11. He said he hasn't been told how many days to hold his eliquis  and I don't see it on the chart either, I apologize if its there and I'm just not seeing it.   I contacted patients PCP office in regards to obtaining blood thinner advice-spoke with Tia. Tia said she would message Michelene Childs nurse to let her know about my request.  Noticed that he had been seen by Cardiology on 05/07/23 for CAD.  Missed this on triage.  Cardiac clearance being sent today.  Thanks,  [place], CMA

## 2023-05-22 NOTE — Telephone Encounter (Signed)
   Pre-operative Risk Assessment    Patient Name: Victor Knight  DOB: 1950/10/13 MRN: [id number]   Date of last office visit: 05/07/2023 Date of next office visit: None   Request for Surgical Clearance    Procedure:   Colonoscopy  Date of Surgery:  Clearance 05/29/23                                 Surgeon:  Dr Jinny Socks Group or Practice Name:  [facility] Phone number:  [phone] Fax number:  [phone]   Type of Clearance Requested:   - Medical  - Pharmacy:  Hold Apixaban  (Eliquis ) Not Indicated.   Type of Anesthesia:  General    Additional requests/questions:    Signed, Edsel Grayce Sanders   05/22/2023, 3:58 PM

## 2023-05-22 NOTE — Telephone Encounter (Signed)
Fax received from patients PCP advising patient to stop Eliquis 2 days prior to colonoscopy and restart 2 days after.  Patient verbalized understanding.  Message routed to [facility] as well.  Thanks,  [place], [place]

## 2023-05-23 NOTE — Telephone Encounter (Signed)
 Good Morning Dr. Darliss  We have received a surgical clearance request for Mr. Gentles for a colonoscopy procedure. They were seen recently in clinic on 05/07/2023. Can you please comment on surgical clearance for his upcoming procedure. Please forward you guidance and recommendations to P CV DIV PREOP   Thank you, Jackee Alberts, NP

## 2023-05-23 NOTE — Telephone Encounter (Signed)
 Patient with diagnosis of PE on Eliquis  for anticoagulation.    Procedure: colonoscopy Date of procedure: 05/29/23   CrCl 79 ml/min Platelet count 207K  Per office protocol, patient can hold Eliquis  for 2 days prior to procedure.    **This guidance is not considered finalized until pre-operative APP has relayed final recommendations.**

## 2023-05-24 NOTE — Telephone Encounter (Signed)
   Patient Name: Victor Knight  DOB: 1950-08-12 MRN: [id number]  Primary Cardiologist: Redell Cave, MD  Chart reviewed as part of pre-operative protocol coverage. Given past medical history and time since last visit, based on ACC/AHA guidelines, [name] is at acceptable risk for the planned procedure without further cardiovascular testing.   Per Dr. Cave, Patient is Okay for procedure/colonoscopy from a cardiac perspective.  Per office protocol, patient can hold Eliquis  for 2 days prior to procedure.  Please resume when safe to do so from a bleeding standpoint.  I will route this recommendation to the requesting party via Epic fax function and remove from pre-op pool.  Please call with questions.  Nautia Lem D Janaa Acero, NP 05/24/2023, 10:57 AM

## 2023-05-29 ENCOUNTER — Ambulatory Visit: Payer: Medicare PPO | Admitting: Anesthesiology

## 2023-05-29 ENCOUNTER — Encounter: Payer: Self-pay | Admitting: Gastroenterology

## 2023-05-29 ENCOUNTER — Other Ambulatory Visit: Payer: Self-pay

## 2023-05-29 ENCOUNTER — Encounter
Admission: RE | Disposition: A | Discharge status: Home / Self Care | Payer: Self-pay | Source: Home / Self Care | Attending: Gastroenterology

## 2023-05-29 ENCOUNTER — Ambulatory Visit
Admission: RE | Admit: 2023-05-29 | Discharge: 2023-05-29 | Disposition: A | Discharge status: Home / Self Care | Payer: Medicare PPO | Attending: Gastroenterology | Admitting: Gastroenterology

## 2023-05-29 DIAGNOSIS — K5730 Diverticulosis of large intestine without perforation or abscess without bleeding: Secondary | ICD-10-CM | POA: Diagnosis not present

## 2023-05-29 DIAGNOSIS — Z1211 Encounter for screening for malignant neoplasm of colon: Secondary | ICD-10-CM

## 2023-05-29 DIAGNOSIS — K640 First degree hemorrhoids: Secondary | ICD-10-CM | POA: Insufficient documentation

## 2023-05-29 DIAGNOSIS — Z87891 Personal history of nicotine dependence: Secondary | ICD-10-CM | POA: Diagnosis not present

## 2023-05-29 DIAGNOSIS — I10 Essential (primary) hypertension: Secondary | ICD-10-CM | POA: Insufficient documentation

## 2023-05-29 DIAGNOSIS — K635 Polyp of colon: Secondary | ICD-10-CM

## 2023-05-29 DIAGNOSIS — D123 Benign neoplasm of transverse colon: Secondary | ICD-10-CM | POA: Diagnosis not present

## 2023-05-29 HISTORY — PX: POLYPECTOMY: SHX5525

## 2023-05-29 HISTORY — PX: COLONOSCOPY WITH PROPOFOL: SHX5780

## 2023-05-29 SURGERY — COLONOSCOPY WITH PROPOFOL
Anesthesia: General

## 2023-05-29 MED ORDER — LIDOCAINE HCL (CARDIAC) PF 100 MG/5ML IV SOSY
PREFILLED_SYRINGE | INTRAVENOUS | Status: DC | PRN
  Administered 2023-05-29: 80 mg via INTRAVENOUS

## 2023-05-29 MED ORDER — DEXMEDETOMIDINE HCL IN NACL 80 MCG/20ML IV SOLN
INTRAVENOUS | Status: AC
  Filled 2023-05-29: qty 20

## 2023-05-29 MED ORDER — PROPOFOL 10 MG/ML IV BOLUS
INTRAVENOUS | Status: DC | PRN
  Administered 2023-05-29: 50 mg via INTRAVENOUS

## 2023-05-29 MED ORDER — PROPOFOL 1000 MG/100ML IV EMUL
INTRAVENOUS | Status: AC
  Filled 2023-05-29: qty 100

## 2023-05-29 MED ORDER — DEXMEDETOMIDINE HCL IN NACL 80 MCG/20ML IV SOLN
INTRAVENOUS | Status: DC | PRN
  Administered 2023-05-29: 20 ug via INTRAVENOUS

## 2023-05-29 MED ORDER — SODIUM CHLORIDE 0.9 % IV SOLN
INTRAVENOUS | Status: DC
Start: 2023-05-29 — End: 2023-05-29

## 2023-05-29 MED ORDER — PROPOFOL 500 MG/50ML IV EMUL
INTRAVENOUS | Status: DC | PRN
  Administered 2023-05-29: 75 ug/kg/min via INTRAVENOUS

## 2023-05-29 MED ORDER — LIDOCAINE HCL (PF) 2 % IJ SOLN
INTRAMUSCULAR | Status: AC
  Filled 2023-05-29: qty 5

## 2023-05-29 NOTE — Transfer of Care (Signed)
Immediate Anesthesia Transfer of Care Note  Patient: GEDEON BRANDOW  Procedure(s) Performed: COLONOSCOPY WITH PROPOFOL POLYPECTOMY  Patient Location: PACU  Anesthesia Type:General  Level of Consciousness: sedated  Airway & Oxygen Therapy: Patient Spontanous Breathing  Post-op Assessment: Report given to RN and Post -op Vital signs reviewed and stable  Post vital signs: Reviewed and stable  Last Vitals:  Vitals Value Taken Time  BP 99/70 05/29/23 0820  Temp 36.1 C 05/29/23 0819  Pulse 74 05/29/23 0820  Resp 17 05/29/23 0820  SpO2 96 % 05/29/23 0820  Vitals shown include unfiled device data.  Last Pain:  Vitals:   05/29/23 0819  TempSrc: Temporal  PainSc: Asleep         Complications: No notable events documented.

## 2023-05-29 NOTE — Op Note (Signed)
[facility] Gastroenterology Patient Name: Victor Knight Procedure Date: 05/29/2023 7:59 AM MRN: [id number] Account #: [id number] Date of Birth: 05-Mar-1951 Admit Type: Outpatient Age: 73 Room: [facility] ENDO ROOM 4 Gender: Male Note Status: Finalized Instrument Name: Nelda Marseille [id number] Procedure:             Colonoscopy Indications:           Screening for colorectal malignant neoplasm Providers:             Midge Minium MD, MD Referring MD:          Danelle Berry (Referring MD) Medicines:             Propofol per Anesthesia Complications:         No immediate complications. Procedure:             Pre-Anesthesia Assessment:                        - Prior to the procedure, a History and Physical was                         performed, and patient medications and allergies were                         reviewed. The patient's tolerance of previous                         anesthesia was also reviewed. The risks and benefits                         of the procedure and the sedation options and risks                         were discussed with the patient. All questions were                         answered, and informed consent was obtained. Prior                         Anticoagulants: The patient has taken no anticoagulant                         or antiplatelet agents. ASA Grade Assessment: II - A                         patient with mild systemic disease. After reviewing                         the risks and benefits, the patient was deemed in                         satisfactory condition to undergo the procedure.                        After obtaining informed consent, the colonoscope was                         passed under direct vision. Throughout the procedure,  the patient's blood pressure, pulse, and oxygen                         saturations were monitored continuously. The                         Colonoscope was introduced through the anus  and                         advanced to the the cecum, identified by the                         appendiceal orifice. The colonoscopy was performed                         without difficulty. The patient tolerated the                         procedure well. The quality of the bowel preparation                         was excellent. Findings:      The perianal and digital rectal examinations were normal.      A 6 mm polyp was found in the hepatic flexure. The polyp was sessile.       The polyp was removed with a cold snare. Resection and retrieval were       complete.      Multiple small-mouthed diverticula were found in the sigmoid colon and       descending colon.      Non-bleeding internal hemorrhoids were found during retroflexion. The       hemorrhoids were Grade I (internal hemorrhoids that do not prolapse). Impression:            - One 6 mm polyp at the hepatic flexure, removed with                         a cold snare. Resected and retrieved.                        - Diverticulosis in the sigmoid colon and in the                         descending colon.                        - Non-bleeding internal hemorrhoids. Recommendation:        - Discharge patient to home.                        - Resume previous diet.                        - Continue present medications.                        - Await pathology results.                        - If the pathology report reveals adenomatous tissue,  then repeat the colonoscopy for surveillance in 7                         years. Procedure Code(s):     --- Professional ---                        [phone], Colonoscopy, flexible; with removal of                         tumor(s), polyp(s), or other lesion(s) by snare                         technique Diagnosis Code(s):     --- Professional ---                        Z12.11, Encounter for screening for malignant neoplasm                         of colon                         D12.3, Benign neoplasm of transverse colon (hepatic                         flexure or splenic flexure) CPT copyright 2022 American Medical Association. All rights reserved. The codes documented in this report are preliminary and upon coder review may  be revised to meet current compliance requirements. Midge Minium MD, MD 05/29/2023 8:19:33 AM This report has been signed electronically. Number of Addenda: 0 Note Initiated On: 05/29/2023 7:59 AM Scope Withdrawal Time: 0 hours 7 minutes 37 seconds  Total Procedure Duration: 0 hours 10 minutes 14 seconds  Estimated Blood Loss:  Estimated blood loss: none.      [facility]

## 2023-05-29 NOTE — H&P (Signed)
Midge Minium, MD [facility] [address] [place], [place] [address] Phone: [phone] Fax : [phone]  Primary Care Physician:  Danelle Berry, PA-C Primary Gastroenterologist:  Dr. Servando Snare  Pre-Procedure History & Physical: HPI:  Victor Knight is a 73 y.o. male is here for a screening colonoscopy.   Past Medical History:  Diagnosis Date   Abdominal aortic atherosclerosis (HCC) 10/27/2015   Adhesive capsulitis of shoulder 04/09/2017   Aortic ectasia, abdominal (HCC) 10/27/2015   Bilateral pulmonary embolism (HCC) 04/28/2019   Bursitis of shoulder 04/09/2017   Complete tear of right rotator cuff 04/20/2016   Dupuytren contracture 04/09/2017   Dysplastic nevus 07/13/2014   L epigastric - moder to severe, excision 08/25/2014   Dysplastic nevus 11/03/2015   R mid back 5.0 cm lat to spine - moderate   Gout    Hyperlipidemia    Hypertension    Insomnia    Plantar fasciitis of left foot 03/31/2019   Tendinitis of shoulder 04/09/2017    Past Surgical History:  Procedure Laterality Date   CARPAL TUNNEL RELEASE     rotator cuff surgery   04/20/2016   right arm; [facility]    TRIGGER FINGER RELEASE      Prior to Admission medications   Medication Sig Start Date End Date Taking? Authorizing Provider  allopurinol (ZYLOPRIM) 100 MG tablet TAKE 1 TABLET(100 MG) BY MOUTH DAILY 02/20/23  Yes Danelle Berry, PA-C  ezetimibe (ZETIA) 10 MG tablet TAKE 1 TABLET(10 MG) BY MOUTH DAILY 04/23/23  Yes Danelle Berry, PA-C  lisinopril (ZESTRIL) 20 MG tablet Take 1.5 tablets (30 mg total) by mouth daily. 01/02/23  Yes Danelle Berry, PA-C  rosuvastatin (CRESTOR) 40 MG tablet TAKE 1 TABLET(40 MG) BY MOUTH DAILY 02/19/23  Yes Danelle Berry, PA-C  traZODone (DESYREL) 100 MG tablet TAKE 1 AND 1/2 TABLETS(150 MG) BY MOUTH AT BEDTIME AS NEEDED FOR SLEEP 01/02/23  Yes Danelle Berry, PA-C  apixaban (ELIQUIS) 2.5 MG TABS tablet Take 1 tablet (2.5 mg total) by mouth 2 (two) times daily. 01/02/23   Danelle Berry, PA-C   latanoprost (XALATAN) 0.005 % ophthalmic solution Place 1 drop into the right eye daily. 10/04/20   [provider]  Multiple Vitamin (MULTIVITAMIN) tablet Take 1 tablet by mouth daily.    [provider]    Allergies as of 04/27/2023 - Review Complete 04/27/2023  Allergen Reaction Noted   Keflex [cephalexin] Rash 10/05/2015    Family History  Problem Relation Age of Onset   Heart attack Father    Hypertension Father    Heart disease Father    Hypertension Sister    Hypertension Brother    Hypertension Brother    Atrial fibrillation Brother    Hypertension Brother    Hypertension Brother    Heart disease Brother    Atrial fibrillation Brother    Supraventricular tachycardia Daughter    Heart disease Daughter     Social History   Socioeconomic History   Marital status: Married    Spouse name: [name]   Number of children: 1   Years of education: Not on file   Highest education level: 12th grade  Occupational History   Occupation: Retired  Tobacco Use   Smoking status: Former   Smokeless tobacco: Never  Advertising account planner   Vaping status: Never Used  Substance and Sexual Activity   Alcohol use: Yes    Alcohol/week: 7.0 standard drinks of alcohol    Types: 7 Glasses of wine per week   Drug  use: No   Sexual activity: Yes    Partners: Female  Other Topics Concern   Not on file  Social History Narrative   Not on file   Social Drivers of Health   Financial Resource Strain: Low Risk  (04/26/2023)   Overall Financial Resource Strain (CARDIA)    Difficulty of Paying Living Expenses: Not hard at all  Food Insecurity: No Food Insecurity (04/26/2023)   Hunger Vital Sign    Worried About Running Out of Food in the Last Year: Never true    Ran Out of Food in the Last Year: Never true  Transportation Needs: No Transportation Needs (04/26/2023)   PRAPARE - Administrator, Civil Service (Medical): No    Lack of Transportation (Non-Medical): No   Physical Activity: Insufficiently Active (04/26/2023)   Exercise Vital Sign    Days of Exercise per Week: 2 days    Minutes of Exercise per Session: 60 min  Stress: No Stress Concern Present (04/26/2023)   [employer] of Occupational Health - Occupational Stress Questionnaire    Feeling of Stress : Not at all  Social Connections: Socially Integrated (04/26/2023)   Social Connection and Isolation Panel [NHANES]    Frequency of Communication with Friends and Family: Three times a week    Frequency of Social Gatherings with Friends and Family: Three times a week    Attends Religious Services: More than 4 times per year    Active Member of Clubs or Organizations: Yes    Attends Banker Meetings: More than 4 times per year    Marital Status: Married  Catering manager Violence: Not At Risk (04/26/2023)   Humiliation, Afraid, Rape, and Kick questionnaire    Fear of Current or Ex-Partner: No    Emotionally Abused: No    Physically Abused: No    Sexually Abused: No    Review of Systems: See HPI, otherwise negative ROS  Physical Exam: There were no vitals taken for this visit. General:   Alert,  pleasant and cooperative in NAD Head:  Normocephalic and atraumatic. Neck:  Supple; no masses or thyromegaly. Lungs:  Clear throughout to auscultation.    Heart:  Regular rate and rhythm. Abdomen:  Soft, nontender and nondistended. Normal bowel sounds, without guarding, and without rebound.   Neurologic:  Alert and  oriented x4;  grossly normal neurologically.  Impression/Plan: Victor Knight is now here to undergo a screening colonoscopy.  Risks, benefits, and alternatives regarding colonoscopy have been reviewed with the patient.  Questions have been answered.  All parties agreeable.

## 2023-05-29 NOTE — Anesthesia Postprocedure Evaluation (Signed)
Anesthesia Post Note  Patient: Victor Knight  Procedure(s) Performed: COLONOSCOPY WITH PROPOFOL POLYPECTOMY  Patient location during evaluation: Endoscopy Anesthesia Type: General Level of consciousness: awake and alert Pain management: pain level controlled Vital Signs Assessment: post-procedure vital signs reviewed and stable Respiratory status: spontaneous breathing, nonlabored ventilation, respiratory function stable and patient connected to nasal cannula oxygen Cardiovascular status: blood pressure returned to baseline and stable Postop Assessment: no apparent nausea or vomiting Anesthetic complications: no   No notable events documented.   Last Vitals:  Vitals:   05/29/23 0829 05/29/23 0839  BP: 118/78   Pulse: 78 69  Resp:  16  Temp:    SpO2: 98% 99%    Last Pain:  Vitals:   05/29/23 0839  TempSrc:   PainSc: 0-No pain                 Cleda Mccreedy Elyan Vanwieren

## 2023-05-29 NOTE — Anesthesia Preprocedure Evaluation (Signed)
Anesthesia Evaluation  Patient identified by MRN, date of birth, ID band Patient awake    Reviewed: Allergy & Precautions, NPO status , Patient's Chart, lab work & pertinent test results  History of Anesthesia Complications Negative for: history of anesthetic complications  Airway Mallampati: III  TM Distance: <3 FB Neck ROM: full    Dental  (+) Chipped   Pulmonary neg shortness of breath, former smoker   Pulmonary exam normal        Cardiovascular Exercise Tolerance: Good hypertension, (-) angina + CAD  Normal cardiovascular exam     Neuro/Psych negative neurological ROS  negative psych ROS   GI/Hepatic negative GI ROS,,,(+) Hepatitis -  Endo/Other  negative endocrine ROS    Renal/GU negative Renal ROS  negative genitourinary   Musculoskeletal   Abdominal   Peds  Hematology negative hematology ROS (+)   Anesthesia Other Findings Past Medical History: 10/27/2015: Abdominal aortic atherosclerosis (HCC) 04/09/2017: Adhesive capsulitis of shoulder 10/27/2015: Aortic ectasia, abdominal (HCC) 04/28/2019: Bilateral pulmonary embolism (HCC) 04/09/2017: Bursitis of shoulder 04/20/2016: Complete tear of right rotator cuff 04/09/2017: Dupuytren contracture 07/13/2014: Dysplastic nevus     Comment:  L epigastric - moder to severe, excision 08/25/2014 11/03/2015: Dysplastic nevus     Comment:  R mid back 5.0 cm lat to spine - moderate No date: Gout No date: Hyperlipidemia No date: Hypertension No date: Insomnia 03/31/2019: Plantar fasciitis of left foot 04/09/2017: Tendinitis of shoulder  Past Surgical History: No date: CARPAL TUNNEL RELEASE 04/20/2016: rotator cuff surgery      Comment:  right arm; [facility]  No date: TRIGGER FINGER RELEASE     Reproductive/Obstetrics negative OB ROS                             Anesthesia Physical Anesthesia Plan  ASA: 3  Anesthesia Plan: General    Post-op Pain Management:    Induction: Intravenous  PONV Risk Score and Plan: Propofol infusion and TIVA  Airway Management Planned: Natural Airway and Nasal Cannula  Additional Equipment:   Intra-op Plan:   Post-operative Plan:   Informed Consent: I have reviewed the patients History and Physical, chart, labs and discussed the procedure including the risks, benefits and alternatives for the proposed anesthesia with the patient or authorized representative who has indicated his/her understanding and acceptance.     Dental Advisory Given  Plan Discussed with: Anesthesiologist, CRNA and Surgeon  Anesthesia Plan Comments: (Patient consented for risks of anesthesia including but not limited to:  - adverse reactions to medications - risk of airway placement if required - damage to eyes, teeth, lips or other oral mucosa - nerve damage due to positioning  - sore throat or hoarseness - Damage to heart, brain, nerves, lungs, other parts of body or loss of life  Patient voiced understanding and assent.)       Anesthesia Quick Evaluation

## 2023-05-30 ENCOUNTER — Encounter: Payer: Self-pay | Admitting: Gastroenterology

## 2023-05-30 LAB — SURGICAL PATHOLOGY

## 2023-06-01 ENCOUNTER — Encounter: Payer: Self-pay | Admitting: Gastroenterology

## 2023-06-25 ENCOUNTER — Ambulatory Visit: Admitting: Nurse Practitioner

## 2023-06-25 ENCOUNTER — Encounter: Payer: Self-pay | Admitting: Nurse Practitioner

## 2023-06-25 VITALS — BP 138/80 | HR 80 | Resp 18 | Ht 69.0 in | Wt 193.2 lb

## 2023-06-25 DIAGNOSIS — J0140 Acute pansinusitis, unspecified: Secondary | ICD-10-CM | POA: Diagnosis not present

## 2023-06-25 MED ORDER — DOXYCYCLINE HYCLATE 100 MG PO TABS: 100.0000 mg | ORAL_TABLET | Freq: Two times a day (BID) | ORAL | 0 refills | Status: AC

## 2023-06-25 NOTE — Progress Notes (Signed)
 BP 138/80   Pulse 80   Resp 18   Ht 5\' 9"  (1.753 m)   Wt 193 lb 3.2 oz (87.6 kg)   SpO2 97%   BMI 28.53 kg/m    Subjective:    Patient ID: Victor Knight, male    DOB: [birth year], 73 y.o.   MRN: [id number]  HPI: Victor Knight is a 73 y.o. male  Chief Complaint  Patient presents with   Sinusitis    Onset 1 week, cough, sore throat, congestion facial pressure    Discussed the use of AI scribe software for clinical note transcription with the patient, who gave verbal consent to proceed.  History of Present Illness   The patient presents with a persistent nasal congestion, facial pressure, cough and sore throat.  He has been experiencing a persistent cough and cold symptoms for over a week. His nasal discharge is yellow, and he develops a cough and sore throat if untreated. No fever or shortness of breath is present.  He had COVID-19 about three weeks ago, which may be contributing to his current symptoms.  He manages his symptoms with over-the-counter medications including Sudafed, Nyquil at night, and nasal spray as needed. His symptoms return if he stops taking these medications.  He is planning to travel to [place] with his granddaughter, who is attending a conference.       04/26/2023    1:08 PM 01/02/2023   10:44 AM 06/23/2022   10:16 AM  Depression screen PHQ 2/9  Decreased Interest 0 0 0  Down, Depressed, Hopeless 0 0 0  PHQ - 2 Score 0 0 0  Altered sleeping 0 0 0  Tired, decreased energy 0 0 0  Change in appetite 0 0 0  Feeling bad or failure about yourself  0 0 0  Trouble concentrating 0 0 0  Moving slowly or fidgety/restless 0 0 0  Suicidal thoughts 0 0 0  PHQ-9 Score 0 0 0  Difficult doing work/chores Not difficult at all Not difficult at all Not difficult at all    Relevant past medical, surgical, family and social history reviewed and updated as indicated. Interim medical history since our last visit reviewed. Allergies and medications reviewed  and updated.  Review of Systems  Ten systems reviewed and is negative except as mentioned in HPI      Objective:    BP 138/80   Pulse 80   Resp 18   Ht 5\' 9"  (1.753 m)   Wt 193 lb 3.2 oz (87.6 kg)   SpO2 97%   BMI 28.53 kg/m    Wt Readings from Last 3 Encounters:  06/25/23 193 lb 3.2 oz (87.6 kg)  05/29/23 189 lb 9.6 oz (86 kg)  05/07/23 198 lb (89.8 kg)    Physical Exam Vitals reviewed.  Constitutional:      Appearance: Normal appearance.  HENT:     Head: Normocephalic.     Right Ear: Tympanic membrane normal.     Left Ear: Tympanic membrane normal.     Nose:     Right Sinus: Maxillary sinus tenderness and frontal sinus tenderness present.     Left Sinus: Maxillary sinus tenderness and frontal sinus tenderness present.     Mouth/Throat:     Mouth: Mucous membranes are moist.     Pharynx: Oropharynx is clear. No oropharyngeal exudate or posterior oropharyngeal erythema.  Eyes:     Extraocular Movements: Extraocular movements intact.     Conjunctiva/sclera: Conjunctivae normal.  Pupils: Pupils are equal, round, and reactive to light.  Cardiovascular:     Rate and Rhythm: Normal rate and regular rhythm.  Pulmonary:     Effort: Pulmonary effort is normal.     Breath sounds: Normal breath sounds.  Lymphadenopathy:     Cervical: No cervical adenopathy.  Skin:    General: Skin is warm and dry.  Neurological:     General: No focal deficit present.     Mental Status: He is alert and oriented to person, place, and time. Mental status is at baseline.  Psychiatric:        Mood and Affect: Mood normal.        Behavior: Behavior normal.        Thought Content: Thought content normal.        Judgment: Judgment normal.     Results for orders placed or performed during the hospital encounter of 05/29/23  Surgical pathology   Collection Time: 05/29/23 12:00 AM  Result Value Ref Range   SURGICAL PATHOLOGY      SURGICAL PATHOLOGY [facility] [address] [place], [place] [address] Telephone [phone] or [phone] Fax [phone]  REPORT OF SURGICAL PATHOLOGY   Accession #: [id number] Patient Name: Victor Knight, Victor Knight Visit # : [id number]  MRN: [id number] Physician: Midge Minium DOB/Age 05-23-1950 (Age: 19) Gender: M Collected Date: 05/29/2023 Received Date: 05/29/2023  FINAL DIAGNOSIS       1. Hepatic Flexure Polyp, cold snare :       TUBULAR ADENOMA      NEGATIVE FOR HIGH-GRADE DYSPLASIA AND CARCINOMA       DATE SIGNED OUT: 05/30/2023 ELECTRONIC SIGNATURE : [place] Md, Merlyn Albert , Sports administrator, Electronic Signature  MICROSCOPIC DESCRIPTION  CASE COMMENTS STAINS USED IN DIAGNOSIS: H&E    CLINICAL HISTORY  SPECIMEN(S) OBTAINED 1. Hepatic Flexure Polyp, Cold Snare  SPECIMEN COMMENTS: SPECIMEN CLINICAL INFORMATION: 1. Screening colonoscopy    Gross Description 1. "Colon snared hepatic flexure colon polyp", rece ived in formalin is a 1.3 x 0.7 x 0.1 cm  aggregate of multiple tan-gray tissue fragments. The specimen is filtered and submitted in toto in 1 block (1A).      [facility] 05/29/2023        Report signed out from the following location(s) [place] [address], [place] [address] CLIA #: [id number]  [facility] [address] [place], [place] [address] CLIA #: [id number]        Assessment & Plan:   Problem List Items Addressed This Visit   None Visit Diagnoses       Acute non-recurrent pansinusitis    -  Primary   Relevant Medications   doxycycline (VIBRA-TABS) 100 MG tablet        Assessment and Plan    Upper Respiratory Infection Symptoms for one week with yellow nasal discharge, cough, and sore throat. No fever or shortness of breath. Recent history of COVID-19 three weeks ago. Currently self-medicating with Sudafed, Nyquil, and nasal spray. -Prescribe Doxycycline. -Continue current over-the-counter  medications (Sudafed, Nyquil, nasal spray). -can start zyrtec, flonase and mucinex -Advise to continue fluid intake. -Check-in if symptoms worsen or persist.        Follow up plan: Return if symptoms worsen or fail to improve.

## 2023-06-26 ENCOUNTER — Ambulatory Visit: Admitting: Family Medicine

## 2023-07-02 ENCOUNTER — Encounter: Payer: Medicare PPO | Admitting: Family Medicine

## 2023-07-10 ENCOUNTER — Encounter: Payer: Self-pay | Admitting: Family Medicine

## 2023-07-10 ENCOUNTER — Ambulatory Visit (INDEPENDENT_AMBULATORY_CARE_PROVIDER_SITE_OTHER): Admitting: Family Medicine

## 2023-07-10 VITALS — BP 124/80 | HR 90 | Temp 97.9°F | Resp 16 | Ht 69.0 in | Wt 194.1 lb

## 2023-07-10 DIAGNOSIS — Z0000 Encounter for general adult medical examination without abnormal findings: Secondary | ICD-10-CM

## 2023-07-10 DIAGNOSIS — Z86711 Personal history of pulmonary embolism: Secondary | ICD-10-CM

## 2023-07-10 DIAGNOSIS — I10 Essential (primary) hypertension: Secondary | ICD-10-CM

## 2023-07-10 DIAGNOSIS — Z23 Encounter for immunization: Secondary | ICD-10-CM | POA: Diagnosis not present

## 2023-07-10 DIAGNOSIS — M1000 Idiopathic gout, unspecified site: Secondary | ICD-10-CM

## 2023-07-10 DIAGNOSIS — R7301 Impaired fasting glucose: Secondary | ICD-10-CM | POA: Diagnosis not present

## 2023-07-10 DIAGNOSIS — G4700 Insomnia, unspecified: Secondary | ICD-10-CM

## 2023-07-10 DIAGNOSIS — Z7901 Long term (current) use of anticoagulants: Secondary | ICD-10-CM

## 2023-07-10 DIAGNOSIS — E785 Hyperlipidemia, unspecified: Secondary | ICD-10-CM

## 2023-07-10 MED ORDER — ZOSTER VAC RECOMB ADJUVANTED 50 MCG/0.5ML IM SUSR: 0.5000 mL | Freq: Once | INTRAMUSCULAR | 1 refills | Status: AC

## 2023-07-10 NOTE — Progress Notes (Addendum)
 Patient: Victor Knight, Male    DOB: 1950-11-18, 73 y.o.   MRN: [id number] Danelle Berry, PA-C Visit Date: 07/10/2023  Today's Provider: Danelle Berry, PA-C   Chief Complaint  Patient presents with   Annual Exam   Subjective:   Annual physical exam:  Victor Knight is a 73 y.o. male who presents today for health maintenance and annual & complete physical exam.   Exercise/Activity:   active 2 days a week walking more, starting to swim Diet/nutrition:  healthy chicken fish Sleep: sleeping well with trazodone  Pt did Medicare Well visit with LPN in Jan- all reviewed did CRC screening   SDOH Screenings   Food Insecurity: No Food Insecurity (04/26/2023)  Housing: Low Risk  (04/26/2023)  Transportation Needs: No Transportation Needs (04/26/2023)  Utilities: Not At Risk (04/26/2023)  Alcohol Screen: Low Risk  (04/26/2023)  Depression (PHQ2-9): Low Risk  (04/26/2023)  Financial Resource Strain: Low Risk  (04/26/2023)  Physical Activity: Insufficiently Active (04/26/2023)  Social Connections: Socially Integrated (04/26/2023)  Stress: No Stress Concern Present (04/26/2023)  Tobacco Use: Medium Risk (07/10/2023)  Health Literacy: Adequate Health Literacy (04/26/2023)    USPSTF grade A and B recommendations - reviewed and addressed today  Depression:  Phq 9 completed today by patient, was reviewed by me with patient in the room, score is  negative, pt feels mood is good     04/26/2023    1:08 PM 01/02/2023   10:44 AM 06/23/2022   10:16 AM  Depression screen PHQ 2/9  Decreased Interest 0 0 0  Down, Depressed, Hopeless 0 0 0  PHQ - 2 Score 0 0 0  Altered sleeping 0 0 0  Tired, decreased energy 0 0 0  Change in appetite 0 0 0  Feeling bad or failure about yourself  0 0 0  Trouble concentrating 0 0 0  Moving slowly or fidgety/restless 0 0 0  Suicidal thoughts 0 0 0  PHQ-9 Score 0 0 0  Difficult doing work/chores Not difficult at all Not difficult at all Not difficult at all    Hep C  Screening: done   STD testing and prevention (HIV/chl/gon/syphilis): none needed, monogomous   Intimate partner violence: safe   Advanced Care Planning:  A voluntary discussion about advance care planning including the explanation and discussion of advance directives.  Discussed health care proxy and Living will, and the patient was able to identify a health care proxy as wife .  Patient does have a living will at present time. If patient does have living will, I have requested they bring this to the clinic to be scanned in to their chart.  Health Maintenance  Topic Date Due   COVID-19 Vaccine (6 - 2024-25 season) 12/17/2022   Zoster Vaccines- Shingrix (1 of 2) 10/09/2023 (Originally 09/04/1969)   Medicare Annual Wellness (AWV)  04/25/2024   DTaP/Tdap/Td (2 - Td or Tdap) 09/28/2025   Colonoscopy  05/28/2030   Pneumonia Vaccine 48+ Years old  Completed   INFLUENZA VACCINE  Completed   Hepatitis C Screening  Completed   HPV VACCINES  Aged Out    Skin cancer: goes to derm regularly  Pt reports no hx of skin cancer, suspicious lesions/biopsies in the past.  One lesion on chest was precancerous   Colorectal cancer:  colonoscopy is done just last month, reviewed pathology and f/up time - 7 yrs  Prostate cancer:  Prostate cancer screening with PSA: Discussed risks and benefits of PSA testing and provided handout.  Pt  to have PSA drawn today. Lab Results  Component Value Date   PSA 2.14 04/29/2020   PSA 1.2 03/28/2019   PSA 0.8 02/22/2018  Aged out - PSA discussion and shared decision making Having him fill out IPSS q's  Urinary Symptoms:    IPSS     Row Name 07/10/23 1100         International Prostate Symptom Score   How often have you had the sensation of not emptying your bladder? Not at All     How often have you had to urinate less than every two hours? Less than 1 in 5 times     How often have you found you stopped and started again several times when you urinated? Not at  All     How often have you found it difficult to postpone urination? Less than 1 in 5 times     How often have you had a weak urinary stream? Not at All     How often have you had to strain to start urination? Not at All     How many times did you typically get up at night to urinate? 1 Time     Total IPSS Score 3       Quality of Life due to urinary symptoms   If you were to spend the rest of your life with your urinary condition just the way it is now how would you feel about that? Delighted               Lung cancer:   Low Dose CT Chest recommended if Age 4-80 years, 20 pack-year currently smoking OR have quit w/in 15years. Patient does not qualify.   Social History   Tobacco Use   Smoking status: Former   Smokeless tobacco: Never   Tobacco comments:    50 years ago  Substance Use Topics   Alcohol use: Yes    Alcohol/week: 7.0 standard drinks of alcohol    Types: 7 Glasses of wine per week     Alcohol screening: Flowsheet Row Clinical Support [facility]  AUDIT-C Score 4       AAA: done in 2017The USPSTF recommends one-time screening with ultrasonography in men ages 81 to 68 years who have ever smoked  ECG: not indicated today, reviewed past ECG in chart   Blood pressure/Hypertension: BP Readings from Last 3 Encounters:  07/10/23 124/80  06/25/23 138/80  05/29/23 118/78   Weight/Obesity: Wt Readings from Last 3 Encounters:  07/10/23 194 lb 1.6 oz (88 kg)  06/25/23 193 lb 3.2 oz (87.6 kg)  05/29/23 189 lb 9.6 oz (86 kg)   BMI Readings from Last 3 Encounters:  07/10/23 28.66 kg/m  06/25/23 28.53 kg/m  05/29/23 28.00 kg/m    Lipids:  Lab Results  Component Value Date   CHOL 140 06/19/2022   CHOL 148 06/27/2021   CHOL 188 10/27/2020   Lab Results  Component Value Date   HDL 60 06/19/2022   HDL 60 06/27/2021   HDL 59 10/27/2020   Lab Results  Component Value Date   LDLCALC 59 06/19/2022   LDLCALC  69 06/27/2021   LDLCALC 105 (H) 10/27/2020   Lab Results  Component Value Date   TRIG 124 06/19/2022   TRIG 104 06/27/2021   TRIG 141 10/27/2020   Lab Results  Component Value Date   CHOLHDL 2.3 06/19/2022   CHOLHDL 2.5 06/27/2021   CHOLHDL 3.2  10/27/2020   No results found for: "LDLDIRECT" Based on the results of lipid panel his/her cardiovascular risk factor ( using [facility] )  in the next 10 years is : The 10-year ASCVD risk score (Arnett DK, et al., 2019) is: 18.2%   Values used to calculate the score:     Age: 22 years     Sex: Male     Is Non-Hispanic African American: No     Diabetic: No     Tobacco smoker: No     Systolic Blood Pressure: 124 mmHg     Is BP treated: Yes     HDL Cholesterol: 60 mg/dL     Total Cholesterol: 140 mg/dL Glucose:  Glucose, Bld  Date Value Ref Range Status  06/19/2022 91 65 - 99 mg/dL Final    Comment:    .            Fasting reference interval .   06/27/2021 100 (H) 65 - 99 mg/dL Final    Comment:    .            Fasting reference interval . For someone without known diabetes, a glucose value between 100 and 125 mg/dL is consistent with prediabetes and should be confirmed with a follow-up test. .   10/27/2020 86 65 - 99 mg/dL Final    Comment:    .            Fasting reference interval .     Social History       Social History   Socioeconomic History   Marital status: Married    Spouse name: [name]   Number of children: 1   Years of education: Not on file   Highest education level: 12th grade  Occupational History   Occupation: Retired  Tobacco Use   Smoking status: Former   Smokeless tobacco: Never   Tobacco comments:    50 years ago  Vaping Use   Vaping status: Never Used  Substance and Sexual Activity   Alcohol use: Yes    Alcohol/week: 7.0 standard drinks of alcohol    Types: 7 Glasses of wine per week   Drug use: No   Sexual activity: Yes    Partners: Female  Other Topics Concern   Not on  file  Social History Narrative   Not on file   Social Drivers of Health   Financial Resource Strain: Low Risk  (04/26/2023)   Overall Financial Resource Strain (CARDIA)    Difficulty of Paying Living Expenses: Not hard at all  Food Insecurity: No Food Insecurity (04/26/2023)   Hunger Vital Sign    Worried About Running Out of Food in the Last Year: Never true    Ran Out of Food in the Last Year: Never true  Transportation Needs: No Transportation Needs (04/26/2023)   PRAPARE - Administrator, Civil Service (Medical): No    Lack of Transportation (Non-Medical): No  Physical Activity: Insufficiently Active (04/26/2023)   Exercise Vital Sign    Days of Exercise per Week: 2 days    Minutes of Exercise per Session: 60 min  Stress: No Stress Concern Present (04/26/2023)   [employer] of Occupational Health - Occupational Stress Questionnaire    Feeling of Stress : Not at all  Social Connections: Socially Integrated (04/26/2023)   Social Connection and Isolation Panel [NHANES]    Frequency of Communication with Friends and Family: Three times a week    Frequency of Social Gatherings  with Friends and Family: Three times a week    Attends Religious Services: More than 4 times per year    Active Member of Clubs or Organizations: Yes    Attends Engineer, structural: More than 4 times per year    Marital Status: Married    Family History        Family History  Problem Relation Age of Onset   Heart attack Father    Hypertension Father    Heart disease Father    Hypertension Sister    Obesity Sister    Hypertension Brother    Hypertension Brother    Atrial fibrillation Brother    Hypertension Brother    Hypertension Brother    Heart disease Brother    Atrial fibrillation Brother    Hypertension Maternal Grandmother    Supraventricular tachycardia Daughter    Heart disease Daughter    Hypertension Paternal Uncle     Patient Active Problem List   Diagnosis Date  Noted   Encounter for screening colonoscopy 05/29/2023   Polyp of ascending colon 05/29/2023   Primary open angle glaucoma (POAG) of both eyes 06/27/2021   Aortic atherosclerosis (HCC) 04/29/2020   History of pulmonary embolus (PE) 04/29/2020   Coronary artery disease involving native heart without angina pectoris 04/29/2020   Abdominal aortic atherosclerosis (HCC) 10/27/2015   Aortic ectasia, abdominal (HCC) 10/27/2015   Hx of smoking 10/05/2015   History of hepatitis C 10/05/2015   Essential hypertension 10/05/2014   Insomnia 10/05/2014   Hyperlipidemia LDL goal <70 10/05/2014   Gout 10/05/2014    Past Surgical History:  Procedure Laterality Date   CARPAL TUNNEL RELEASE     COLONOSCOPY WITH PROPOFOL N/A 05/29/2023   Procedure: COLONOSCOPY WITH PROPOFOL;  Surgeon: Midge Minium, MD;  Location: [facility] ENDOSCOPY;  Service: Endoscopy;  Laterality: N/A;   POLYPECTOMY  05/29/2023   Procedure: POLYPECTOMY;  Surgeon: Midge Minium, MD;  Location: [facility] ENDOSCOPY;  Service: Endoscopy;;   rotator cuff surgery   04/20/2016   right arm; [facility]    TRIGGER FINGER RELEASE       Current Outpatient Medications:    allopurinol (ZYLOPRIM) 100 MG tablet, TAKE 1 TABLET(100 MG) BY MOUTH DAILY, Disp: 90 tablet, Rfl: 1   apixaban (ELIQUIS) 2.5 MG TABS tablet, Take 1 tablet (2.5 mg total) by mouth 2 (two) times daily., Disp: 180 tablet, Rfl: 1   ezetimibe (ZETIA) 10 MG tablet, TAKE 1 TABLET(10 MG) BY MOUTH DAILY, Disp: 90 tablet, Rfl: 0   latanoprost (XALATAN) 0.005 % ophthalmic solution, Place 1 drop into the right eye daily., Disp: , Rfl:    lisinopril (ZESTRIL) 20 MG tablet, Take 1.5 tablets (30 mg total) by mouth daily., Disp: 135 tablet, Rfl: 1   Multiple Vitamin (MULTIVITAMIN) tablet, Take 1 tablet by mouth daily., Disp: , Rfl:    rosuvastatin (CRESTOR) 40 MG tablet, TAKE 1 TABLET(40 MG) BY MOUTH DAILY, Disp: 90 tablet, Rfl: 1   traZODone (DESYREL) 100 MG tablet, TAKE 1 AND 1/2 TABLETS(150 MG) BY MOUTH  AT BEDTIME AS NEEDED FOR SLEEP, Disp: 135 tablet, Rfl: 2   Zoster Vaccine Adjuvanted (SHINGRIX) injection, Inject 0.5 mLs into the muscle once for 1 dose. And repeat once more in 2-6 months, Disp: 0.5 mL, Rfl: 1  Allergies  Allergen Reactions   Keflex [Cephalexin] Rash    Patient Care Team: Danelle Berry, PA-C as PCP - General (Family Medicine) Debbe Odea, MD as PCP - Cardiology (Cardiology) Deirdre Evener, MD (Dermatology) Dingeldein, Viviann Spare,  MD (Ophthalmology)   Chart Review: I personally reviewed active problem list, medication list, allergies, family history, social history, health maintenance, notes from last encounter, lab results, imaging with the patient/caregiver today.   Review of Systems  Constitutional: Negative.   HENT: Negative.    Eyes: Negative.   Respiratory: Negative.    Cardiovascular: Negative.   Gastrointestinal: Negative.   Endocrine: Negative.   Genitourinary: Negative.   Musculoskeletal: Negative.   Skin: Negative.   Allergic/Immunologic: Negative.   Neurological: Negative.   Hematological: Negative.   Psychiatric/Behavioral: Negative.    All other systems reviewed and are negative.         Objective:   Vitals:  Vitals:   07/10/23 1016  BP: 124/80  Pulse: 90  Resp: 16  Temp: 97.9 F (36.6 C)  TempSrc: Oral  SpO2: 99%  Weight: 194 lb 1.6 oz (88 kg)  Height: 5\' 9"  (1.753 m)    Body mass index is 28.66 kg/m.  Physical Exam Vitals and nursing note reviewed.  Constitutional:      General: Victor Knight is not in acute distress.    Appearance: Normal appearance. Victor Knight is well-developed. Victor Knight is not ill-appearing, toxic-appearing or diaphoretic.  HENT:     Head: Normocephalic and atraumatic.     Right Ear: Tympanic membrane, ear canal and external ear normal. There is no impacted cerumen.     Left Ear: Tympanic membrane, ear canal and external ear normal. There is no impacted cerumen.     Nose: Congestion present.     Mouth/Throat:      Mouth: Mucous membranes are moist.     Pharynx: Oropharynx is clear. No oropharyngeal exudate or posterior oropharyngeal erythema.  Eyes:     General: No scleral icterus.       Right eye: No discharge.        Left eye: No discharge.     Conjunctiva/sclera: Conjunctivae normal.  Neck:     Trachea: No tracheal deviation.  Cardiovascular:     Rate and Rhythm: Normal rate and regular rhythm.     Pulses: Normal pulses.     Heart sounds: Normal heart sounds. No murmur heard.    No friction rub. No gallop.  Pulmonary:     Effort: Pulmonary effort is normal. No respiratory distress.     Breath sounds: Normal breath sounds. No stridor. No wheezing, rhonchi or rales.  Abdominal:     General: Bowel sounds are normal.     Palpations: Abdomen is soft.  Skin:    General: Skin is warm and dry.     Capillary Refill: Capillary refill takes less than 2 seconds.     Findings: No rash.  Neurological:     Mental Status: Victor Knight is alert. Mental status is at baseline.     Motor: No abnormal muscle tone.     Coordination: Coordination normal.     Gait: Gait normal.  Psychiatric:        Mood and Affect: Mood normal.        Behavior: Behavior normal.      Recent Results (from the past 2160 hours)  Surgical pathology     Status: None   Collection Time: 05/29/23 12:00 AM  Result Value Ref Range   SURGICAL PATHOLOGY      SURGICAL PATHOLOGY [facility] [address], [address] [place], [place] [address] Telephone [phone] or [phone] Fax [phone]  REPORT OF SURGICAL PATHOLOGY   Accession #: [id number] Patient Name: LAITH, ANTONELLI  Visit # : [id number]  MRN: [id number] Physician: Midge Minium DOB/Age 08/31/1950 (Age: 75) Gender: M Collected Date: 05/29/2023 Received Date: 05/29/2023  FINAL DIAGNOSIS       1. Hepatic Flexure Polyp, cold snare :       TUBULAR ADENOMA      NEGATIVE FOR HIGH-GRADE DYSPLASIA AND CARCINOMA       DATE SIGNED OUT:  05/30/2023 ELECTRONIC SIGNATURE : Picklesimer Md, Merlyn Albert , Sports administrator, Electronic Signature  MICROSCOPIC DESCRIPTION  CASE COMMENTS STAINS USED IN DIAGNOSIS: H&E    CLINICAL HISTORY  SPECIMEN(S) OBTAINED 1. Hepatic Flexure Polyp, Cold Snare  SPECIMEN COMMENTS: SPECIMEN CLINICAL INFORMATION: 1. Screening colonoscopy    Gross Description 1. "Colon snared hepatic flexure colon polyp", rece ived in formalin is a 1.3 x 0.7 x 0.1 cm  aggregate of multiple tan-gray tissue fragments. The specimen is filtered and submitted in toto in 1 block (1A).      [facility] 05/29/2023        Report signed out from the following location(s) [facility] [address], [place] [address] CLIA #: [id number]  [facility] [address] [place], [place] [address] CLIA #: [id number]     Fall Risk:    07/10/2023   10:16 AM 04/26/2023    1:11 PM 01/02/2023   10:44 AM 06/23/2022   10:15 AM 06/19/2022    2:36 PM  Fall Risk   Falls in the past year? 0 0 0 0 0  Number falls in past yr: 0 0 0 0 0  Injury with Fall? 0 0 0 0 0  Risk for fall due to : No Fall Risks No Fall Risks No Fall Risks No Fall Risks No Fall Risks  Follow up  Falls prevention discussed;Falls evaluation completed Falls prevention discussed;Education provided;Falls evaluation completed Falls prevention discussed;Education provided;Falls evaluation completed Falls prevention discussed;Education provided;Falls evaluation completed    Functional Status Survey:     Assessment & Plan:    CPE completed today  Prostate cancer screening and PSA options (with potential risks and benefits of testing vs not testing) were discussed along with recent recs/guidelines, shared decision making and handout/information given to pt today  USPSTF grade A and B recommendations reviewed with patient; age-appropriate recommendations, preventive care, screening tests, etc discussed and encouraged; healthy living  encouraged; see AVS for patient education given to patient  Discussed importance of 150 minutes of physical activity weekly, AHA exercise recommendations given to pt in AVS/handout  Discussed importance of healthy diet:  eating lean meats and proteins, avoiding trans fats and saturated fats, avoid simple sugars and excessive carbs in diet, eat 6 servings of fruit/vegetables daily and drink plenty of water and avoid sweet beverages.  DASH diet reviewed if pt has HTN  Recommended pt to do annual eye exam and routine dental exams/cleanings  Advance Care planning information and packet discussed and offered today, encouraged pt to discuss with family members/spouse/partner/friends and complete Advanced directive packet and bring copy to office   Reviewed Health Maintenance: Health Maintenance  Topic Date Due   COVID-19 Vaccine (6 - 2024-25 season) 12/17/2022   Zoster Vaccines- Shingrix (1 of 2) 10/09/2023 (Originally 09/04/1969)   Medicare Annual Wellness (AWV)  04/25/2024   DTaP/Tdap/Td (2 - Td or Tdap) 09/28/2025   Colonoscopy  05/28/2030   Pneumonia Vaccine 38+ Years old  Completed   INFLUENZA VACCINE  Completed   Hepatitis C Screening  Completed   HPV VACCINES  Aged Out  Immunizations: Immunization History  Administered Date(s) Administered   Fluad Quad(high Dose 65+) 01/23/2019, 01/07/2020   Influenza, High Dose Seasonal PF 01/08/2017, 02/02/2018, 12/16/2020   Influenza-Unspecified 12/17/2015, 12/24/2021, 12/26/2022   Moderna Covid-19 Vaccine Bivalent Booster 66yrs & up 07/17/2020, 12/16/2020   Moderna Sars-Covid-2 Vaccination 05/15/2019, 06/12/2019, 02/09/2020   Pneumococcal Conjugate-13 10/05/2015, 01/08/2017   Pneumococcal Polysaccharide-23 02/28/2019   Rsv, Bivalent, Protein Subunit Rsvpref,pf Verdis Frederickson) 12/24/2021   Tdap 09/29/2015   Vaccines:  HPV: n/a Shingrix: DUE 50-64 yo and ask insurance if covered when patient above 16 yo Pneumonia: done  Flu: out of stock -  recommend annually      ICD-10-CM   1. Well adult exam  Z00.00 Lipid Profile    CBC with Differential/Platelet    COMPLETE METABOLIC PANEL WITH GFR    2. Need for shingles vaccine  Z23 Zoster Vaccine Adjuvanted [facility]) injection        Return in about 6 months (around 01/10/2024) for Routine follow-up HLD, HTN med refills.   Danelle Berry, PA-C 07/10/23 10:37 AM  [facility]

## 2023-07-10 NOTE — Patient Instructions (Signed)
  Health Maintenance  Topic Date Due   COVID-19 Vaccine (6 - 2024-25 season) 12/17/2022   Zoster (Shingles) Vaccine (1 of 2) 10/09/2023*   Medicare Annual Wellness Visit  04/25/2024   DTaP/Tdap/Td vaccine (2 - Td or Tdap) 09/28/2025   Colon Cancer Screening  05/28/2030   Pneumonia Vaccine  Completed   Flu Shot  Completed   Hepatitis C Screening  Completed   HPV Vaccine  Aged Out  *Topic was postponed. The date shown is not the original due date.

## 2023-07-11 ENCOUNTER — Other Ambulatory Visit: Payer: Self-pay

## 2023-07-11 ENCOUNTER — Encounter: Payer: Self-pay | Admitting: Family Medicine

## 2023-07-11 DIAGNOSIS — R7309 Other abnormal glucose: Secondary | ICD-10-CM

## 2023-07-11 MED ORDER — TRAZODONE HCL 100 MG PO TABS: ORAL_TABLET | ORAL | 2 refills | Status: DC

## 2023-07-11 MED ORDER — ROSUVASTATIN CALCIUM 40 MG PO TABS: 40.0000 mg | ORAL_TABLET | Freq: Every day | ORAL | 3 refills | Status: AC

## 2023-07-11 MED ORDER — APIXABAN 2.5 MG PO TABS: 2.5000 mg | ORAL_TABLET | Freq: Two times a day (BID) | ORAL | 1 refills | Status: DC

## 2023-07-11 MED ORDER — EZETIMIBE 10 MG PO TABS: 10.0000 mg | ORAL_TABLET | Freq: Every day | ORAL | 3 refills | Status: DC

## 2023-07-11 MED ORDER — LISINOPRIL 20 MG PO TABS: 30.0000 mg | ORAL_TABLET | Freq: Every day | ORAL | 1 refills | Status: DC

## 2023-07-11 MED ORDER — ALLOPURINOL 100 MG PO TABS: ORAL_TABLET | ORAL | 1 refills | Status: DC

## 2023-07-11 NOTE — Addendum Note (Signed)
 Addended by: Danelle Berry on: 07/11/2023 10:01 AM   Modules accepted: Orders

## 2023-07-13 LAB — COMPLETE METABOLIC PANEL WITHOUT GFR
AG Ratio: 1.8 (calc) (ref 1.0–2.5)
ALT: 40 U/L (ref 9–46)
AST: 35 U/L (ref 10–35)
Albumin: 4.6 g/dL (ref 3.6–5.1)
Alkaline phosphatase (APISO): 40 U/L (ref 35–144)
BUN: 18 mg/dL (ref 7–25)
CO2: 29 mmol/L (ref 20–32)
Calcium: 9.8 mg/dL (ref 8.6–10.3)
Chloride: 103 mmol/L (ref 98–110)
Creat: 1.05 mg/dL (ref 0.70–1.28)
Globulin: 2.6 g/dL (ref 1.9–3.7)
Glucose, Bld: 102 mg/dL — ABNORMAL HIGH (ref 65–99)
Potassium: 5.2 mmol/L (ref 3.5–5.3)
Sodium: 138 mmol/L (ref 135–146)
Total Bilirubin: 0.6 mg/dL (ref 0.2–1.2)
Total Protein: 7.2 g/dL (ref 6.1–8.1)

## 2023-07-13 LAB — LIPID PANEL
Cholesterol: 145 mg/dL (ref <200)
HDL: 63 mg/dL (ref >=40)
LDL Cholesterol (Calc): 66 mg/dL
Non-HDL Cholesterol (Calc): 82 mg/dL (ref <130)
Total CHOL/HDL Ratio: 2.3 (calc) (ref <5.0)
Triglycerides: 75 mg/dL (ref <150)

## 2023-07-13 LAB — CBC WITH DIFFERENTIAL/PLATELET
Absolute Lymphocytes: 1932 {cells}/uL (ref 850–3900)
Absolute Monocytes: 524 {cells}/uL (ref 200–950)
Basophils Absolute: 51 {cells}/uL (ref 0–200)
Basophils Relative: 0.9 %
Eosinophils Absolute: 80 {cells}/uL (ref 15–500)
Eosinophils Relative: 1.4 %
HCT: 46.6 % (ref 38.5–50.0)
Hemoglobin: 15.6 g/dL (ref 13.2–17.1)
MCH: 30.2 pg (ref 27.0–33.0)
MCHC: 33.5 g/dL (ref 32.0–36.0)
MCV: 90.1 fL (ref 80.0–100.0)
MPV: 9.9 fL (ref 7.5–12.5)
Monocytes Relative: 9.2 %
Neutro Abs: 3112 {cells}/uL (ref 1500–7800)
Neutrophils Relative %: 54.6 %
Platelets: 177 10*3/uL (ref 140–400)
RBC: 5.17 10*6/uL (ref 4.20–5.80)
RDW: 12.4 % (ref 11.0–15.0)
Total Lymphocyte: 33.9 %
WBC: 5.7 10*3/uL (ref 3.8–10.8)

## 2023-07-13 LAB — TEST AUTHORIZATION 2

## 2023-07-13 LAB — HEMOGLOBIN A1C
Hgb A1c MFr Bld: 5.9 %{Hb} — ABNORMAL HIGH (ref <5.7)
Mean Plasma Glucose: 123 mg/dL
eAG (mmol/L): 6.8 mmol/L

## 2023-07-17 ENCOUNTER — Other Ambulatory Visit: Payer: Self-pay | Admitting: Family Medicine

## 2023-07-17 DIAGNOSIS — Z7901 Long term (current) use of anticoagulants: Secondary | ICD-10-CM

## 2023-07-17 DIAGNOSIS — Z86711 Personal history of pulmonary embolism: Secondary | ICD-10-CM

## 2023-07-21 ENCOUNTER — Other Ambulatory Visit: Payer: Self-pay | Admitting: Family Medicine

## 2023-07-21 DIAGNOSIS — E785 Hyperlipidemia, unspecified: Secondary | ICD-10-CM

## 2023-07-23 NOTE — Telephone Encounter (Signed)
 Requested Prescriptions  Pending Prescriptions Disp Refills   ezetimibe (ZETIA) 10 MG tablet [Pharmacy Med Name: EZETIMIBE 10MG  TABLETS] 90 tablet 3    Sig: TAKE 1 TABLET(10 MG) BY MOUTH DAILY     There is no refill protocol information for this order

## 2023-08-22 ENCOUNTER — Telehealth: Admitting: Family Medicine

## 2023-08-22 ENCOUNTER — Encounter: Payer: Self-pay | Admitting: Family Medicine

## 2023-08-22 VITALS — BP 122/70 | HR 90 | Temp 98.5°F | Resp 16 | Ht 69.0 in | Wt 190.1 lb

## 2023-08-22 DIAGNOSIS — R051 Acute cough: Secondary | ICD-10-CM | POA: Diagnosis not present

## 2023-08-22 DIAGNOSIS — R6889 Other general symptoms and signs: Secondary | ICD-10-CM | POA: Diagnosis not present

## 2023-08-22 LAB — POC COVID19/FLU A&B COMBO
Covid Antigen, POC: NEGATIVE
Influenza A Antigen, POC: NEGATIVE
Influenza B Antigen, POC: NEGATIVE

## 2023-08-22 MED ORDER — BENZONATATE 100 MG PO CAPS: 100.0000 mg | ORAL_CAPSULE | Freq: Three times a day (TID) | ORAL | 0 refills | Status: DC | PRN

## 2023-08-22 NOTE — Progress Notes (Signed)
 Name: Victor Knight   MRN: [id number]    DOB: 10-11-1950   Date:08/22/2023       Progress Note  Subjective:    Chief Complaint  Chief Complaint  Patient presents with   Flu like sx    I connected with  BASTION GIERE  on 08/22/23 at  4:00 PM EDT by a video enabled telemedicine application and verified that I am speaking with the correct person using two identifiers.  I discussed the limitations of evaluation and management by telemedicine and the availability of in person appointments. The patient expressed understanding and agreed to proceed. Staff also discussed with the patient that there may be a patient responsible charge related to this service. Patient Location: home Provider Location: [facility] Additional Individuals present: none  Cough Associated symptoms include a fever, headaches, myalgias and sweats. He has tried OTC cough suppressant for the symptoms. The treatment provided mild relief.    POC testing here today for acute sx, feels like flu sx, onset ~4 d ago, sx very minor Saturday to Sunday and then last 2-3 days has been worst. Last year around the same time had similar sx/rhinosinusitis and reported hx of sinus infection about once a year but none for a few years Real back hacking cough, fever, aches, chills night sweats x 2 nights No chest pain, distress, wheeze, DOE Just hears congestion and can't stop coughing    Patient Active Problem List   Diagnosis Date Noted   Encounter for screening colonoscopy 05/29/2023   Polyp of ascending colon 05/29/2023   Primary open angle glaucoma (POAG) of both eyes 06/27/2021   Aortic atherosclerosis (HCC) 04/29/2020   History of pulmonary embolus (PE) 04/29/2020   Coronary artery disease involving native heart without angina pectoris 04/29/2020   Abdominal aortic atherosclerosis (HCC) 10/27/2015   Aortic ectasia, abdominal (HCC) 10/27/2015   Hx of smoking 10/05/2015   History of hepatitis C 10/05/2015   Essential  hypertension 10/05/2014   Insomnia 10/05/2014   Hyperlipidemia LDL goal <70 10/05/2014   Gout 10/05/2014    Social History   Tobacco Use   Smoking status: Former   Smokeless tobacco: Never   Tobacco comments:    50 years ago  Substance Use Topics   Alcohol use: Yes    Alcohol/week: 7.0 standard drinks of alcohol    Types: 7 Glasses of wine per week     Current Outpatient Medications:    allopurinol  (ZYLOPRIM ) 100 MG tablet, TAKE 1 TABLET(100 MG) BY MOUTH DAILY, Disp: 90 tablet, Rfl: 1   apixaban  (ELIQUIS ) 2.5 MG TABS tablet, Take 1 tablet (2.5 mg total) by mouth 2 (two) times daily., Disp: 180 tablet, Rfl: 1   ezetimibe  (ZETIA ) 10 MG tablet, Take 1 tablet (10 mg total) by mouth daily., Disp: 90 tablet, Rfl: 3   latanoprost (XALATAN) 0.005 % ophthalmic solution, Place 1 drop into the right eye daily., Disp: , Rfl:    lisinopril  (ZESTRIL ) 20 MG tablet, Take 1.5 tablets (30 mg total) by mouth daily., Disp: 135 tablet, Rfl: 1   Multiple Vitamin (MULTIVITAMIN) tablet, Take 1 tablet by mouth daily., Disp: , Rfl:    rosuvastatin  (CRESTOR ) 40 MG tablet, Take 1 tablet (40 mg total) by mouth daily., Disp: 90 tablet, Rfl: 3   traZODone  (DESYREL ) 100 MG tablet, TAKE 1 AND 1/2 TABLETS(150 MG) BY MOUTH AT BEDTIME AS NEEDED FOR SLEEP, Disp: 135 tablet, Rfl: 2  Allergies  Allergen Reactions   Keflex [Cephalexin] Rash  I personally reviewed active problem list, medication list, allergies, family history, social history, health maintenance, notes from last encounter, lab results, imaging with the patient/caregiver today.   Review of Systems  Constitutional:  Positive for fever.  HENT: Negative.    Eyes: Negative.   Respiratory:  Positive for cough.   Cardiovascular: Negative.   Gastrointestinal: Negative.   Endocrine: Negative.   Genitourinary: Negative.   Musculoskeletal:  Positive for myalgias.  Skin: Negative.   Allergic/Immunologic: Negative.   Neurological:  Positive for  headaches.  Hematological: Negative.   Psychiatric/Behavioral: Negative.    All other systems reviewed and are negative.     Objective:   Virtual encounter, vitals limited, only able to obtain the following Today's Vitals   08/22/23 1458  BP: 122/70  Pulse: 90  Resp: 16  Temp: 98.5 F (36.9 C)  TempSrc: Oral  SpO2: 97%  Weight: 190 lb 1.6 oz (86.2 kg)  Height: 5\' 9"  (1.753 m)   Body mass index is 28.07 kg/m. Nursing Note and Vital Signs reviewed.  Physical Exam Vitals and nursing note reviewed.  Constitutional:      General: He is not in acute distress.    Appearance: He is not ill-appearing, toxic-appearing or diaphoretic.  Pulmonary:     Effort: No respiratory distress.  Neurological:     Mental Status: He is alert.  Psychiatric:        Mood and Affect: Mood normal.     PE limited by virtual encounter  Results for orders placed or performed in visit on 08/22/23 (from the past 72 hours)  POC Covid19/Flu A&B Antigen     Status: None   Collection Time: 08/22/23  3:08 PM  Result Value Ref Range   Influenza A Antigen, POC Negative Negative   Influenza B Antigen, POC Negative Negative   Covid Antigen, POC Negative Negative    Assessment and Plan:     ICD-10-CM   1. Flu-like symptoms  R68.89 POC Covid19/Flu A&B Antigen    2. Acute cough  R05.1 DG Chest 2 View    Flu covid testing neg, likely other viral URI vs allergies causing secondary infection?  Recommend OTC supportive care, mucinex, continue robitussin and sent in tessalon  CXR back up  Recommend retests tomorrow at home and we will f/up with him tomorrow Fortunately he has no concerning sx like CP with breathing or SOB, no lung disease, tx with supportive and symptomatic measures   -Red flags and when to present for emergency care or RTC including chest pain, shortness of breath, new/worsening/un-resolving symptoms, reviewed with patient at time of visit. Follow up and care instructions discussed and  provided in AVS. - I discussed the assessment and treatment plan with the patient. The patient was provided an opportunity to ask questions and all were answered. The patient agreed with the plan and demonstrated an understanding of the instructions.  I provided 18+ minutes of non-face-to-face time during this encounter.  Adeline Hone, PA-C 08/22/23 3:25 PM

## 2023-08-26 ENCOUNTER — Other Ambulatory Visit: Payer: Self-pay | Admitting: Family Medicine

## 2023-08-26 DIAGNOSIS — M1000 Idiopathic gout, unspecified site: Secondary | ICD-10-CM

## 2023-08-27 ENCOUNTER — Telehealth: Payer: Self-pay

## 2023-08-27 NOTE — Telephone Encounter (Signed)
 Refill on rosuvastatin  (CRESTOR ) 40 MG tablet

## 2023-08-28 NOTE — Telephone Encounter (Signed)
 Requested Prescriptions  Refused Prescriptions Disp Refills   allopurinol  (ZYLOPRIM ) 100 MG tablet [Pharmacy Med Name: ALLOPURINOL  100MG  TABLETS] 90 tablet 1    Sig: TAKE 1 TABLET(100 MG) BY MOUTH DAILY     Endocrinology:  Gout Agents - allopurinol  Failed - 08/28/2023 12:29 PM      Failed - Uric Acid in normal range and within 360 days    Uric Acid, Serum  Date Value Ref Range Status  06/19/2022 4.3 4.0 - 8.0 mg/dL Final    Comment:    Therapeutic target for gout patients: <6.0 mg/dL .          Passed - Cr in normal range and within 360 days    Creat  Date Value Ref Range Status  07/10/2023 1.05 0.70 - 1.28 mg/dL Final         Passed - Valid encounter within last 12 months    Recent Outpatient Visits           6 days ago Flu-like symptoms   [facility] Adeline Hone, PA-C   1 month ago Well adult exam   [facility] Adeline Hone, PA-C   2 months ago Acute non-recurrent pansinusitis   [facility] Quinton Buckler, FNP       Future Appointments             In 3 months Elta Halter, MD [facility]   In 4 months Adeline Hone, PA-C [facility]            Passed - CBC within normal limits and completed in the last 12 months    WBC  Date Value Ref Range Status  07/10/2023 5.7 3.8 - 10.8 Thousand/uL Final   RBC  Date Value Ref Range Status  07/10/2023 5.17 4.20 - 5.80 Million/uL Final   Hemoglobin  Date Value Ref Range Status  07/10/2023 15.6 13.2 - 17.1 g/dL Final   HCT  Date Value Ref Range Status  07/10/2023 46.6 38.5 - 50.0 % Final   MCHC  Date Value Ref Range Status  07/10/2023 33.5 32.0 - 36.0 g/dL Final    Comment:    For adults, a slight decrease in the calculated MCHC value (in the range of 30 to 32 g/dL) is most likely not clinically significant; however, it should be interpreted with caution in correlation with  other red cell parameters and the patient's clinical condition.    [facility]  Date Value Ref Range Status  07/10/2023 30.2 27.0 - 33.0 pg Final   MCV  Date Value Ref Range Status  07/10/2023 90.1 80.0 - 100.0 fL Final   No results found for: "PLTCOUNTKUC", "LABPLAT", "POCPLA" RDW  Date Value Ref Range Status  07/10/2023 12.4 11.0 - 15.0 % Final

## 2023-08-31 ENCOUNTER — Telehealth: Payer: Self-pay

## 2023-08-31 NOTE — Telephone Encounter (Signed)
 rosuvastatin  (CRESTOR ) 40 MG tablet90 tablet33/26/2025--Sig - Route: Take 1 tablet (40 mg total) by mouth daily. - OralSent to pharmacy as: rosuvastatin  (CRESTOR ) 40 MG tabletE-Prescribing Status: Receipt confirmed by pharmacy (07/11/2023 10:01 AM EDT)

## 2023-08-31 NOTE — Telephone Encounter (Signed)
 Copied from CRM [id number]. Topic: Clinical - Prescription Issue >> Aug 31, 2023 11:25 AM Crispin Dolphin wrote: Reason for CRM: Patient called states he shows that he has 3 refills for rosuvastatin  (CRESTOR ) 40 MG tablet  but pharmacy told him to reach out to provider because they were not able to fill. They reached out and have not received response. Confirmed was sent to correct pharmacy - [facility]. Patient is out of medication. Thank You

## 2023-12-05 ENCOUNTER — Ambulatory Visit: Payer: Medicare PPO | Admitting: Dermatology

## 2023-12-13 ENCOUNTER — Ambulatory Visit: Admitting: Dermatology

## 2023-12-13 ENCOUNTER — Encounter: Payer: Self-pay | Admitting: Dermatology

## 2023-12-13 DIAGNOSIS — W908XXA Exposure to other nonionizing radiation, initial encounter: Secondary | ICD-10-CM | POA: Diagnosis not present

## 2023-12-13 DIAGNOSIS — L570 Actinic keratosis: Secondary | ICD-10-CM

## 2023-12-13 DIAGNOSIS — Z86018 Personal history of other benign neoplasm: Secondary | ICD-10-CM

## 2023-12-13 DIAGNOSIS — Z1283 Encounter for screening for malignant neoplasm of skin: Secondary | ICD-10-CM

## 2023-12-13 DIAGNOSIS — L821 Other seborrheic keratosis: Secondary | ICD-10-CM

## 2023-12-13 DIAGNOSIS — D692 Other nonthrombocytopenic purpura: Secondary | ICD-10-CM

## 2023-12-13 DIAGNOSIS — D1801 Hemangioma of skin and subcutaneous tissue: Secondary | ICD-10-CM

## 2023-12-13 DIAGNOSIS — L578 Other skin changes due to chronic exposure to nonionizing radiation: Secondary | ICD-10-CM

## 2023-12-13 DIAGNOSIS — L814 Other melanin hyperpigmentation: Secondary | ICD-10-CM

## 2023-12-13 DIAGNOSIS — D485 Neoplasm of uncertain behavior of skin: Secondary | ICD-10-CM

## 2023-12-13 DIAGNOSIS — D229 Melanocytic nevi, unspecified: Secondary | ICD-10-CM

## 2023-12-13 DIAGNOSIS — L819 Disorder of pigmentation, unspecified: Secondary | ICD-10-CM

## 2023-12-13 DIAGNOSIS — L820 Inflamed seborrheic keratosis: Secondary | ICD-10-CM | POA: Diagnosis not present

## 2023-12-13 NOTE — Patient Instructions (Signed)

## 2023-12-13 NOTE — Progress Notes (Signed)
 Follow-Up Visit   Subjective  Victor Knight is a 73 y.o. male who presents for the following: Skin Cancer Screening and Full Body Skin Exam  The patient presents for Total-Body Skin Exam (TBSE) for skin cancer screening and mole check. The patient has spots, moles and lesions to be evaluated, some may be new or changing and the patient may have concern these could be cancer.  The following portions of the chart were reviewed this encounter and updated as appropriate: medications, allergies, medical history  Review of Systems:  No other skin or systemic complaints except as noted in HPI or Assessment and Plan.  Objective  Well appearing patient in no apparent distress; mood and affect are within normal limits.  A full examination was performed including scalp, head, eyes, ears, nose, lips, neck, chest, axillae, abdomen, back, buttocks, bilateral upper extremities, bilateral lower extremities, hands, feet, fingers, toes, fingernails, and toenails. All findings within normal limits unless otherwise noted below.   Relevant physical exam findings are noted in the Assessment and Plan.  L sup scapular area 0.5 cm irregular brown macule.  sup sternum x 2, lower legs x 3 (5) Erythematous stuck-on, waxy papule or plaque Face x 7 (7) Erythematous thin papules/macules with gritty scale.   Assessment & Plan   SKIN CANCER SCREENING PERFORMED TODAY.  ACTINIC DAMAGE - Chronic condition, secondary to cumulative UV/sun exposure - diffuse scaly erythematous macules with underlying dyspigmentation - Recommend daily broad spectrum sunscreen SPF 30+ to sun-exposed areas, reapply every 2 hours as needed.  - Staying in the shade or wearing long sleeves, sun glasses (UVA+UVB protection) and wide brim hats (4-inch brim around the entire circumference of the hat) are also recommended for sun protection.  - Call for new or changing lesions.  LENTIGINES, SEBORRHEIC KERATOSES, HEMANGIOMAS - Benign  normal skin lesions - Benign-appearing - Call for any changes  MELANOCYTIC NEVI - Tan-brown and/or pink-flesh-colored symmetric macules and papules - Benign appearing on exam today - Observation - Call clinic for new or changing moles - Recommend daily use of broad spectrum spf 30+ sunscreen to sun-exposed areas.   HISTORY OF DYSPLASTIC NEVI No evidence of recurrence today Recommend regular full body skin exams Recommend daily broad spectrum sunscreen SPF 30+ to sun-exposed areas, reapply every 2 hours as needed.  Call if any new or changing lesions are noted between office visits  Purpura - Chronic; persistent and recurrent.  Treatable, but not curable. - Violaceous macules and patches - Benign - Related to trauma, age, sun damage and/or use of blood thinners, chronic use of topical and/or oral steroids - Observe - Can use OTC arnica containing moisturizer such as Dermend Bruise Formula if desired - Call for worsening or other concerns  NEOPLASM OF UNCERTAIN BEHAVIOR OF SKIN L sup scapular area Epidermal / dermal shaving  Lesion diameter (cm):  0.5 Informed consent: discussed and consent obtained   Timeout: patient name, date of birth, surgical site, and procedure verified   Procedure prep:  Patient was prepped and draped in usual sterile fashion Prep type:  Isopropyl alcohol Anesthesia: the lesion was anesthetized in a standard fashion   Anesthetic:  1% lidocaine  w/ epinephrine  1-100,000 buffered w/ 8.4% NaHCO3 Instrument used: flexible razor blade   Hemostasis achieved with: pressure, aluminum chloride and electrodesiccation   Outcome: patient tolerated procedure well   Post-procedure details: sterile dressing applied and wound care instructions given   Dressing type: bandage (Mupirocin 2% ointment)    Specimen 1 - Surgical  pathology Differential Diagnosis: D48.5 r/o dysplastic nevus Check Margins: Yes INFLAMED SEBORRHEIC KERATOSIS (5) sup sternum x 2, lower legs x 3  (5) Symptomatic, irritating, patient would like treated.  Destruction of lesion - sup sternum x 2, lower legs x 3 (5) Complexity: simple   Destruction method: cryotherapy   Informed consent: discussed and consent obtained   Timeout:  patient name, date of birth, surgical site, and procedure verified Lesion destroyed using liquid nitrogen: Yes   Region frozen until ice ball extended beyond lesion: Yes   Outcome: patient tolerated procedure well with no complications   Post-procedure details: wound care instructions given    AK (ACTINIC KERATOSIS) (7) Face x 7 (7) Actinic keratoses are precancerous spots that appear secondary to cumulative UV radiation exposure/sun exposure over time. They are chronic with expected duration over 1 year. A portion of actinic keratoses will progress to squamous cell carcinoma of the skin. It is not possible to reliably predict which spots will progress to skin cancer and so treatment is recommended to prevent development of skin cancer.  Recommend daily broad spectrum sunscreen SPF 30+ to sun-exposed areas, reapply every 2 hours as needed.  Recommend staying in the shade or wearing long sleeves, sun glasses (UVA+UVB protection) and wide brim hats (4-inch brim around the entire circumference of the hat). Call for new or changing lesions. Destruction of lesion - Face x 7 (7) Complexity: simple   Destruction method: cryotherapy   Informed consent: discussed and consent obtained   Timeout:  patient name, date of birth, surgical site, and procedure verified Lesion destroyed using liquid nitrogen: Yes   Region frozen until ice ball extended beyond lesion: Yes   Outcome: patient tolerated procedure well with no complications   Post-procedure details: wound care instructions given     Return in about 1 year (around 12/12/2024) for TBSE - hx dysplastic nevus.  LILLETTE Rosina Mayans, CMA, am acting as scribe for Alm Rhyme, MD .   Documentation: I have reviewed  the above documentation for accuracy and completeness, and I agree with the above.  Alm Rhyme, MD

## 2023-12-18 LAB — SURGICAL PATHOLOGY

## 2023-12-19 ENCOUNTER — Ambulatory Visit: Payer: Self-pay | Admitting: Dermatology

## 2023-12-19 NOTE — Telephone Encounter (Signed)
 Advised pt of bx results/sh ?

## 2023-12-19 NOTE — Telephone Encounter (Signed)
-----   Message from Alm Rhyme sent at 12/19/2023  1:44 PM EDT ----- FINAL DIAGNOSIS        1. Skin, L sup scapular area :       PIGMENTED SEBORRHEIC KERATOSIS    Benign Keratosis No further treatment needed ----- Message ----- From: Interface, Lab In Three Zero One Sent: 12/18/2023   2:19 PM EDT To: Alm JAYSON Rhyme, MD

## 2024-01-11 ENCOUNTER — Encounter: Payer: Self-pay | Admitting: Nurse Practitioner

## 2024-01-11 ENCOUNTER — Ambulatory Visit: Admitting: Nurse Practitioner

## 2024-01-11 ENCOUNTER — Other Ambulatory Visit: Payer: Self-pay | Admitting: Family Medicine

## 2024-01-11 ENCOUNTER — Ambulatory Visit: Admitting: Family Medicine

## 2024-01-11 VITALS — BP 116/72 | HR 76 | Temp 98.0°F | Resp 18 | Ht 69.0 in | Wt 192.3 lb

## 2024-01-11 DIAGNOSIS — I10 Essential (primary) hypertension: Secondary | ICD-10-CM

## 2024-01-11 DIAGNOSIS — Z7901 Long term (current) use of anticoagulants: Secondary | ICD-10-CM

## 2024-01-11 DIAGNOSIS — E785 Hyperlipidemia, unspecified: Secondary | ICD-10-CM

## 2024-01-11 DIAGNOSIS — M1000 Idiopathic gout, unspecified site: Secondary | ICD-10-CM

## 2024-01-11 DIAGNOSIS — Z86711 Personal history of pulmonary embolism: Secondary | ICD-10-CM

## 2024-01-11 DIAGNOSIS — I700 Atherosclerosis of aorta: Secondary | ICD-10-CM | POA: Diagnosis not present

## 2024-01-11 DIAGNOSIS — I2510 Atherosclerotic heart disease of native coronary artery without angina pectoris: Secondary | ICD-10-CM | POA: Diagnosis not present

## 2024-01-11 DIAGNOSIS — G4700 Insomnia, unspecified: Secondary | ICD-10-CM

## 2024-01-11 DIAGNOSIS — Z23 Encounter for immunization: Secondary | ICD-10-CM

## 2024-01-11 DIAGNOSIS — R7303 Prediabetes: Secondary | ICD-10-CM

## 2024-01-11 MED ORDER — LISINOPRIL 20 MG PO TABS
30.0000 mg | ORAL_TABLET | Freq: Every day | ORAL | 1 refills | Status: AC
Start: 2024-01-11 — End: ?

## 2024-01-11 MED ORDER — ALLOPURINOL 100 MG PO TABS
ORAL_TABLET | ORAL | 1 refills | Status: AC
Start: 2024-01-11 — End: ?

## 2024-01-11 MED ORDER — TRAZODONE HCL 100 MG PO TABS
ORAL_TABLET | ORAL | 2 refills | Status: AC
Start: 2024-01-11 — End: ?

## 2024-01-11 MED ORDER — EZETIMIBE 10 MG PO TABS: 10.0000 mg | ORAL_TABLET | Freq: Every day | ORAL | 3 refills | Status: AC

## 2024-01-11 MED ORDER — APIXABAN 2.5 MG PO TABS: 2.5000 mg | ORAL_TABLET | Freq: Two times a day (BID) | ORAL | 1 refills | Status: AC

## 2024-01-11 NOTE — Addendum Note (Signed)
 Addended by: Hildreth Robart F on: 01/11/2024 01:18 PM   Modules accepted: Level of Service

## 2024-01-11 NOTE — Progress Notes (Signed)
 BP 116/72   Pulse 76   Temp 98 F (36.7 C)   Resp 18   Ht 5' 9 (1.753 m)   Wt 192 lb 4.8 oz (87.2 kg)   SpO2 95%   BMI 28.40 kg/m    Subjective:    Patient ID: Victor Knight, male    DOB: [birth year], 73 y.o.   MRN: [id number]  HPI: LISSANDRO DILORENZO is a 73 y.o. male  Chief Complaint  Patient presents with   Medical Management of Chronic Issues   Discussed the use of AI scribe software for clinical note transcription with the patient, who gave verbal consent to proceed.  History of Present Illness Victor Knight is a 73 year old male who presents for a six month follow-up.  Cardiovascular disease management - Aortic atherosclerosis and coronary artery disease managed with current medication regimen, including rosuvastatin  40 mg daily, Zetia  10 mg daily, and lisinopril  30 mg daily - No new cardiovascular symptoms since last visit - Blood pressure and lipid control monitored; last lipid panel in March showed LDL 66 mg/dL  Anticoagulation and thromboembolic history - History of pulmonary embolism - Currently on Eliquis  2.5 mg twice daily for anticoagulation - No new thromboembolic events or bleeding complications  Gout management - On allopurinol  100 mg daily for gout prophylaxis - No recent gout flares or joint pain  Sleep disturbance - Change in sleep pattern since manufacturer of trazodone  was changed - Currently sleeping 5 hours per night, previously 7-8 hours - Trazodone  150 mg at bedtime for sleep  Metabolic and dietary management - History of hyperlipidemia and prediabetes (A1c 5.9 in March) - Mindful of diet, avoids sweets, limits rice and pasta intake - Uses cream in coffee, does not consume sweet drinks or tea         01/11/2024   10:13 AM 04/26/2023    1:08 PM 01/02/2023   10:44 AM  Depression screen PHQ 2/9  Decreased Interest 0 0 0  Down, Depressed, Hopeless 0 0 0  PHQ - 2 Score 0 0 0  Altered sleeping 0 0 0  Tired, decreased energy 0 0  0  Change in appetite 0 0 0  Feeling bad or failure about yourself  0 0 0  Trouble concentrating 0 0 0  Moving slowly or fidgety/restless 0 0 0  Suicidal thoughts 0 0 0  PHQ-9 Score 0 0 0  Difficult doing work/chores Not difficult at all Not difficult at all Not difficult at all    Relevant past medical, surgical, family and social history reviewed and updated as indicated. Interim medical history since our last visit reviewed. Allergies and medications reviewed and updated.  Review of Systems  Constitutional: Negative for fever or weight change.  Respiratory: Negative for cough and shortness of breath.   Cardiovascular: Negative for chest pain or palpitations.  Gastrointestinal: Negative for abdominal pain, no bowel changes.  Musculoskeletal: Negative for gait problem or joint swelling.  Skin: Negative for rash.  Neurological: Negative for dizziness or headache.  No other specific complaints in a complete review of systems (except as listed in HPI above).      Objective:      BP 116/72   Pulse 76   Temp 98 F (36.7 C)   Resp 18   Ht 5' 9 (1.753 m)   Wt 192 lb 4.8 oz (87.2 kg)   SpO2 95%   BMI 28.40 kg/m    Wt Readings from Last 3 Encounters:  01/11/24 192 lb 4.8 oz (87.2 kg)  08/22/23 190 lb 1.6 oz (86.2 kg)  07/10/23 194 lb 1.6 oz (88 kg)    Physical Exam VITALS: BP- 116/72 MEASUREMENTS: Weight- 192, BMI- 28.4. GENERAL: Alert, cooperative, well developed, no acute distress HEENT: Normocephalic, normal oropharynx, moist mucous membranes CHEST: Clear to auscultation bilaterally, no wheezes, rhonchi, or crackles CARDIOVASCULAR: Normal heart rate and rhythm, S1 and S2 normal without murmurs ABDOMEN: Soft, non-tender, non-distended, without organomegaly, normal bowel sounds EXTREMITIES: No cyanosis or edema NEUROLOGICAL: Cranial nerves grossly intact, moves all extremities without gross motor or sensory deficit  Results for orders placed or performed in visit on  12/13/23  Surgical pathology   Collection Time: 12/13/23 12:00 AM  Result Value Ref Range   SURGICAL PATHOLOGY      SURGICAL PATHOLOGY [facility] [address] [place], [place] [address] Telephone [phone] or [phone] Fax [phone]  REPORT OF DERMATOPATHOLOGY   Accession #: [id number] Patient Name: ASAPH, SERENA Visit # : [id number]  MRN: [id number] Cytotechnologist: Ephriam Rolla Edelman, Dermatopathologist, Electronic Signature DOB/Age [birth year] (Age: 53) Gender: M Collected Date: 12/13/2023 Received Date: 12/13/2023  FINAL DIAGNOSIS       1. Skin, L sup scapular area :       PIGMENTED SEBORRHEIC KERATOSIS       DATE SIGNED OUT: 12/18/2023 ELECTRONIC SIGNATURE : Depcik-Smith Md, Natalie, Dermatopathologist, Electronic Signature  MICROSCOPIC DESCRIPTION 1. There is acanthosis with hyperpigmented keratinocytic proliferation without atypia. This is a pigmented seborrheic keratosis.  CASE COMMENTS STAINS USED IN DIAGNOSIS: H&E    CLINICAL HISTORY  SPECIMEN(S) OBTAINED 1. Skin, L Sup Scapular Area  SPECIMEN C OMMENTS: 1. 0.5 cm irregular brown macule, check margins SPECIMEN CLINICAL INFORMATION: 1. Neoplasm of uncertain behavior of skin, R/O dysplastic nevus    Gross Description 1. Formalin fixed specimen received:  7 X 5 X 1 MM, TOTO (3 P) (1 B) ( erh ) margins inked.        Report signed out from the following location(s) [place] [address] [place], [place] [address] CLIA #: [id number]  [facility] [address] [place], [place] [address] CLIA #: [id number]           Assessment & Plan:   Problem List Items Addressed This Visit       Cardiovascular and Mediastinum   Essential hypertension   Relevant Medications   apixaban  (ELIQUIS ) 2.5 MG TABS tablet   ezetimibe  (ZETIA ) 10 MG tablet   lisinopril  (ZESTRIL ) 20 MG tablet   Other Relevant  Orders   CBC with Differential/Platelet   Comprehensive metabolic panel with GFR   Aortic atherosclerosis   Relevant Medications   apixaban  (ELIQUIS ) 2.5 MG TABS tablet   ezetimibe  (ZETIA ) 10 MG tablet   lisinopril  (ZESTRIL ) 20 MG tablet   Other Relevant Orders   Lipid panel   Coronary artery disease involving native heart without angina pectoris - Primary   Relevant Medications   apixaban  (ELIQUIS ) 2.5 MG TABS tablet   ezetimibe  (ZETIA ) 10 MG tablet   lisinopril  (ZESTRIL ) 20 MG tablet     Other   Insomnia (Chronic)   Relevant Medications   traZODone  (DESYREL ) 100 MG tablet   Hyperlipidemia LDL goal <70 (Chronic)   Relevant Medications   apixaban  (ELIQUIS ) 2.5 MG TABS tablet   ezetimibe  (ZETIA ) 10 MG tablet   lisinopril  (ZESTRIL ) 20 MG tablet   Other Relevant Orders   Lipid panel  Gout   Relevant Medications   allopurinol  (ZYLOPRIM ) 100 MG tablet   History of pulmonary embolus (PE)   Relevant Medications   apixaban  (ELIQUIS ) 2.5 MG TABS tablet   Other Relevant Orders   CBC with Differential/Platelet   Comprehensive metabolic panel with GFR   Other Visit Diagnoses       Immunization due       Relevant Orders   Flu vaccine HIGH DOSE PF(Fluzone Trivalent) (Completed)     Chronic anticoagulation       no concerns today re his long term anticoagulation   Relevant Medications   apixaban  (ELIQUIS ) 2.5 MG TABS tablet     Prediabetes       Relevant Orders   Hemoglobin A1c        Assessment and Plan Assessment & Plan Six Month Follow Up for Chronic Disease Management Blood pressure is well-controlled at 116/72 mmHg. Weight is 192 pounds with a BMI of 28.4. Lab work in March showed lipid panel almost at goal with LDL at 66 mg/dL. A1c was 5.9, indicating prediabetes. - Order lab work to check lipid panel and A1c.  Aortic Atherosclerosis Managed with Zetia  and rosuvastatin .  Coronary Artery Disease Managed with Zetia  and rosuvastatin .  Hypertension Hypertension  is well-controlled with current medication regimen.  Hyperlipidemia Managed with Zetia  and rosuvastatin . Last lipid panel was almost at goal with LDL at 66 mg/dL.  History of Pulmonary Embolism Managed with Eliquis  2.5 mg twice daily. No new issues reported.  Gout Managed with allopurinol  100 mg daily. No recent gout attacks reported.  Prediabetes A1c was 5.9, indicating prediabetes. Discussed dietary modifications to manage blood sugar levels, including reducing intake of starchy foods and processed foods. Current A1c level does not require drastic changes unless it increases. - Monitor A1c levels. - Advise on dietary modifications to manage blood sugar levels.  Insomnia Managed with trazodone  150 mg at bedtime. Reports decreased sleep duration from 7-8 hours to 5 hours after a change in medication manufacturer. - Discuss with pharmacy about reverting to original trazodone  manufacturer.        Follow up plan: Return in about 6 months (around 07/10/2024) for follow up.

## 2024-01-11 NOTE — Telephone Encounter (Signed)
 Duplicate request, refilled 01/11/24.  Requested Prescriptions  Pending Prescriptions Disp Refills   lisinopril  (ZESTRIL ) 20 MG tablet [Pharmacy Med Name: LISINOPRIL  20MG  TABLETS] 135 tablet 1    Sig: TAKE 1 AND 1/2 TABLETS(30 MG) BY MOUTH DAILY     Cardiovascular:  ACE Inhibitors Failed - 01/11/2024  4:13 PM      Failed - Cr in normal range and within 180 days    Creat  Date Value Ref Range Status  07/10/2023 1.05 0.70 - 1.28 mg/dL Final         Failed - K in normal range and within 180 days    Potassium  Date Value Ref Range Status  07/10/2023 5.2 3.5 - 5.3 mmol/L Final         Passed - Patient is not pregnant      Passed - Last BP in normal range    BP Readings from Last 1 Encounters:  01/11/24 116/72         Passed - Valid encounter within last 6 months    Recent Outpatient Visits           Today Coronary artery disease involving native heart without angina pectoris, unspecified vessel or lesion type   [facility] Gareth Mliss FALCON, FNP   4 months ago Flu-like symptoms   [facility] Leavy Mole, PA-C   6 months ago Well adult exam   [facility] Leavy Mole, PA-C   6 months ago Acute non-recurrent pansinusitis   [facility] Gareth Mliss FALCON, FNP       Future Appointments             In 11 months Hester Alm BROCKS, MD [facility]             ELIQUIS  2.5 MG TABS tablet [Pharmacy Med Name: ELIQUIS  2.5MG  TABLETS] 180 tablet 1    Sig: TAKE 1 TABLET(2.5 MG) BY MOUTH TWICE DAILY     Hematology:  Anticoagulants - apixaban  Passed - 01/11/2024  4:13 PM      Passed - PLT in normal range and within 360 days    Platelets  Date Value Ref Range Status  07/10/2023 177 140 - 400 Thousand/uL Final         Passed - HGB in normal range and within 360 days    Hemoglobin  Date Value Ref Range Status  07/10/2023 15.6 13.2 - 17.1 g/dL Final          Passed - HCT in normal range and within 360 days    HCT  Date Value Ref Range Status  07/10/2023 46.6 38.5 - 50.0 % Final         Passed - Cr in normal range and within 360 days    Creat  Date Value Ref Range Status  07/10/2023 1.05 0.70 - 1.28 mg/dL Final         Passed - AST in normal range and within 360 days    AST  Date Value Ref Range Status  07/10/2023 35 10 - 35 U/L Final         Passed - ALT in normal range and within 360 days    ALT  Date Value Ref Range Status  07/10/2023 40 9 - 46 U/L Final         Passed - Valid encounter within last 12 months    Recent Outpatient Visits  Today Coronary artery disease involving native heart without angina pectoris, unspecified vessel or lesion type   [facility] Gareth Mliss FALCON, FNP   4 months ago Flu-like symptoms   [facility] Leavy Mole, PA-C   6 months ago Well adult exam   [facility] Leavy Mole, PA-C   6 months ago Acute non-recurrent pansinusitis   [facility] Gareth Mliss FALCON, FNP       Future Appointments             In 11 months Hester Alm BROCKS, MD [facility]

## 2024-01-12 LAB — CBC WITH DIFFERENTIAL/PLATELET
Absolute Lymphocytes: 1917 {cells}/uL (ref 850–3900)
Absolute Monocytes: 545 {cells}/uL (ref 200–950)
Basophils Absolute: 38 {cells}/uL (ref 0–200)
Basophils Relative: 0.7 %
Eosinophils Absolute: 70 {cells}/uL (ref 15–500)
Eosinophils Relative: 1.3 %
HCT: 45.8 % (ref 38.5–50.0)
Hemoglobin: 15.1 g/dL (ref 13.2–17.1)
MCH: 30.4 pg (ref 27.0–33.0)
MCHC: 33 g/dL (ref 32.0–36.0)
MCV: 92.3 fL (ref 80.0–100.0)
MPV: 9.8 fL (ref 7.5–12.5)
Monocytes Relative: 10.1 %
Neutro Abs: 2830 {cells}/uL (ref 1500–7800)
Neutrophils Relative %: 52.4 %
Platelets: 161 Thousand/uL (ref 140–400)
RBC: 4.96 Million/uL (ref 4.20–5.80)
RDW: 12.6 % (ref 11.0–15.0)
Total Lymphocyte: 35.5 %
WBC: 5.4 Thousand/uL (ref 3.8–10.8)

## 2024-01-12 LAB — LIPID PANEL
Cholesterol: 130 mg/dL (ref <200)
HDL: 70 mg/dL (ref >=40)
LDL Cholesterol (Calc): 43 mg/dL
Non-HDL Cholesterol (Calc): 60 mg/dL (ref <130)
Total CHOL/HDL Ratio: 1.9 (calc) (ref <5.0)
Triglycerides: 85 mg/dL (ref <150)

## 2024-01-12 LAB — COMPREHENSIVE METABOLIC PANEL WITH GFR
AG Ratio: 1.9 (calc) (ref 1.0–2.5)
ALT: 40 U/L (ref 9–46)
AST: 32 U/L (ref 10–35)
Albumin: 4.5 g/dL (ref 3.6–5.1)
Alkaline phosphatase (APISO): 34 U/L — ABNORMAL LOW (ref 35–144)
BUN: 21 mg/dL (ref 7–25)
CO2: 26 mmol/L (ref 20–32)
Calcium: 9.2 mg/dL (ref 8.6–10.3)
Chloride: 104 mmol/L (ref 98–110)
Creat: 1.12 mg/dL (ref 0.70–1.28)
Globulin: 2.4 g/dL (ref 1.9–3.7)
Glucose, Bld: 103 mg/dL — ABNORMAL HIGH (ref 65–99)
Potassium: 4.6 mmol/L (ref 3.5–5.3)
Sodium: 138 mmol/L (ref 135–146)
Total Bilirubin: 0.7 mg/dL (ref 0.2–1.2)
Total Protein: 6.9 g/dL (ref 6.1–8.1)
eGFR: 69 mL/min/1.73m2 (ref >=60)

## 2024-01-12 LAB — HEMOGLOBIN A1C
Hgb A1c MFr Bld: 5.5 % (ref <5.7)
Mean Plasma Glucose: 111 mg/dL
eAG (mmol/L): 6.2 mmol/L

## 2024-01-14 ENCOUNTER — Ambulatory Visit: Payer: Self-pay | Admitting: Nurse Practitioner

## 2024-02-12 ENCOUNTER — Ambulatory Visit: Payer: Self-pay

## 2024-02-12 NOTE — Telephone Encounter (Signed)
 FYI Only or Action Required?: FYI only for provider.  Patient was last seen in primary care on 01/11/2024 by Gareth Mliss FALCON, FNP.  Called Nurse Triage reporting Stye.  Symptoms began a week ago.  Interventions attempted: Rest, hydration, or home remedies.  Symptoms are: unchanged.  Triage Disposition: See PCP When Office is Open (Within 3 Days)  Patient/caregiver understands and will follow disposition?: Yes  **Referred to UC as there is no appointments available within a reasonable timeframe.**        Copied from CRM [id number]. Topic: Clinical - Red Word Triage >> Feb 12, 2024 10:44 AM Ivette P wrote: Red Word that prompted transfer to Nurse Triage: stye on eye and getting bigger in size. Pt is concerned and would like for someone to take a look. Reason for Disposition  [1] After 5 days of treatment per [facility] Advice AND [2] not better  Answer Assessment - Initial Assessment Questions 1. LOCATION: Which eye has the sty? Upper or lower eyelid?     Right eye, lower eye lid   2. SIZE: How big is it? (Note: standard pencil eraser is 6 mm)     Watermelon seed   3. EYELID: Is the eyelid swollen? If Yes, ask: How much?     Yes mild   4. REDNESS: Has the redness spread onto the eyelid?     Redness on lower eyelid   5. ONSET: When did you notice the sty?    X 1 week   6. VISION: Do you have blurred vision?      No   7. PAIN: Is it painful? If Yes, ask: How bad is the pain?  (Scale 1-10; or mild, moderate, severe)     2/10  8. CONTACTS: Do you wear contacts?     No,  glasses   9. OTHER SYMPTOMS: Do you have any other symptoms? (e.g., fever) No   Mild drainage noted from stye. Referred to UC as there is no appointments available within a reasonable timeframe; patient agrees with plan of care.  Protocols used: Sty-A-AH

## 2024-06-06 ENCOUNTER — Ambulatory Visit

## 2024-07-11 ENCOUNTER — Ambulatory Visit: Admitting: Family Medicine

## 2024-12-16 ENCOUNTER — Ambulatory Visit: Admitting: Dermatology

## 2024-12-18 ENCOUNTER — Ambulatory Visit: Admitting: Dermatology
